# Patient Record
Sex: Male | Born: 1970
Health system: Southern US, Community
[De-identification: ages and names within clinical notes are randomized; demographics above are authoritative.]

## PROBLEM LIST (undated history)

## (undated) DIAGNOSIS — R7303 Prediabetes: Secondary | ICD-10-CM

## (undated) DIAGNOSIS — T8859XA Other complications of anesthesia, initial encounter: Secondary | ICD-10-CM

## (undated) DIAGNOSIS — R42 Dizziness and giddiness: Secondary | ICD-10-CM

## (undated) DIAGNOSIS — Z8041 Family history of malignant neoplasm of ovary: Secondary | ICD-10-CM

## (undated) DIAGNOSIS — K649 Unspecified hemorrhoids: Secondary | ICD-10-CM

## (undated) DIAGNOSIS — K219 Gastro-esophageal reflux disease without esophagitis: Secondary | ICD-10-CM

## (undated) DIAGNOSIS — D751 Secondary polycythemia: Secondary | ICD-10-CM

## (undated) DIAGNOSIS — N189 Chronic kidney disease, unspecified: Secondary | ICD-10-CM

## (undated) DIAGNOSIS — Z8481 Family history of carrier of genetic disease: Secondary | ICD-10-CM

## (undated) DIAGNOSIS — G4730 Sleep apnea, unspecified: Secondary | ICD-10-CM

## (undated) DIAGNOSIS — I10 Essential (primary) hypertension: Secondary | ICD-10-CM

## (undated) DIAGNOSIS — Z803 Family history of malignant neoplasm of breast: Secondary | ICD-10-CM

## (undated) DIAGNOSIS — T4145XA Adverse effect of unspecified anesthetic, initial encounter: Secondary | ICD-10-CM

## (undated) DIAGNOSIS — E7800 Pure hypercholesterolemia, unspecified: Secondary | ICD-10-CM

## (undated) DIAGNOSIS — Z8042 Family history of malignant neoplasm of prostate: Secondary | ICD-10-CM

## (undated) DIAGNOSIS — J45909 Unspecified asthma, uncomplicated: Secondary | ICD-10-CM

## (undated) DIAGNOSIS — Z86718 Personal history of other venous thrombosis and embolism: Secondary | ICD-10-CM

## (undated) DIAGNOSIS — R519 Headache, unspecified: Secondary | ICD-10-CM

## (undated) DIAGNOSIS — R51 Headache: Secondary | ICD-10-CM

## (undated) HISTORY — PX: LASIK: SHX215

## (undated) HISTORY — DX: Headache: R51

## (undated) HISTORY — DX: Family history of carrier of genetic disease: Z84.81

## (undated) HISTORY — PX: VASOTOMY: SUR1432

## (undated) HISTORY — DX: Chronic kidney disease, unspecified: N18.9

## (undated) HISTORY — PX: KNEE SURGERY: SHX244

## (undated) HISTORY — DX: Unspecified asthma, uncomplicated: J45.909

## (undated) HISTORY — DX: Dizziness and giddiness: R42

## (undated) HISTORY — DX: Secondary polycythemia: D75.1

## (undated) HISTORY — DX: Unspecified hemorrhoids: K64.9

## (undated) HISTORY — DX: Family history of malignant neoplasm of breast: Z80.3

## (undated) HISTORY — DX: Essential (primary) hypertension: I10

## (undated) HISTORY — DX: Personal history of other venous thrombosis and embolism: Z86.718

## (undated) HISTORY — DX: Sleep apnea, unspecified: G47.30

## (undated) HISTORY — DX: Family history of malignant neoplasm of prostate: Z80.42

## (undated) HISTORY — PX: TONSILLECTOMY: SUR1361

## (undated) HISTORY — DX: Pure hypercholesterolemia, unspecified: E78.00

## (undated) HISTORY — DX: Family history of malignant neoplasm of ovary: Z80.41

## (undated) HISTORY — DX: Headache, unspecified: R51.9

---

## 2005-08-15 HISTORY — PX: OTHER SURGICAL HISTORY: SHX169

## 2007-10-11 ENCOUNTER — Emergency Department: Payer: Self-pay | Admitting: Emergency Medicine

## 2007-10-12 ENCOUNTER — Emergency Department: Payer: Self-pay | Admitting: Emergency Medicine

## 2007-10-14 ENCOUNTER — Emergency Department: Payer: Self-pay | Admitting: Emergency Medicine

## 2008-09-08 ENCOUNTER — Encounter: Admission: RE | Admit: 2008-09-08 | Discharge: 2008-09-08 | Payer: Self-pay | Admitting: Internal Medicine

## 2010-12-21 ENCOUNTER — Other Ambulatory Visit: Payer: Self-pay | Admitting: Family Medicine

## 2010-12-21 DIAGNOSIS — N6321 Unspecified lump in the left breast, upper outer quadrant: Secondary | ICD-10-CM

## 2010-12-27 ENCOUNTER — Other Ambulatory Visit: Payer: Self-pay

## 2011-01-04 ENCOUNTER — Ambulatory Visit
Admission: RE | Admit: 2011-01-04 | Discharge: 2011-01-04 | Disposition: A | Discharge status: Home / Self Care | Payer: BC Managed Care – PPO | Source: Ambulatory Visit | Attending: Family Medicine | Admitting: Family Medicine

## 2011-01-04 ENCOUNTER — Other Ambulatory Visit: Payer: Self-pay | Admitting: Family Medicine

## 2011-01-04 DIAGNOSIS — N6320 Unspecified lump in the left breast, unspecified quadrant: Secondary | ICD-10-CM

## 2011-01-04 DIAGNOSIS — N6321 Unspecified lump in the left breast, upper outer quadrant: Secondary | ICD-10-CM

## 2011-04-23 ENCOUNTER — Emergency Department (HOSPITAL_COMMUNITY)
Admission: EM | Admit: 2011-04-23 | Discharge: 2011-04-23 | Disposition: A | Discharge status: Home / Self Care | Payer: Worker's Compensation | Attending: Emergency Medicine | Admitting: Emergency Medicine

## 2011-04-23 DIAGNOSIS — IMO0002 Reserved for concepts with insufficient information to code with codable children: Secondary | ICD-10-CM | POA: Insufficient documentation

## 2011-04-23 DIAGNOSIS — Y990 Civilian activity done for income or pay: Secondary | ICD-10-CM | POA: Insufficient documentation

## 2011-08-26 ENCOUNTER — Emergency Department: Payer: Self-pay | Admitting: Unknown Physician Specialty

## 2014-07-10 ENCOUNTER — Emergency Department (HOSPITAL_COMMUNITY)
Admission: EM | Admit: 2014-07-10 | Discharge: 2014-07-10 | Disposition: A | Discharge status: Home / Self Care | Payer: 59 | Attending: Emergency Medicine | Admitting: Emergency Medicine

## 2014-07-10 ENCOUNTER — Emergency Department (HOSPITAL_COMMUNITY): Payer: 59

## 2014-07-10 ENCOUNTER — Encounter (HOSPITAL_COMMUNITY): Payer: Self-pay

## 2014-07-10 DIAGNOSIS — R42 Dizziness and giddiness: Secondary | ICD-10-CM | POA: Diagnosis not present

## 2014-07-10 DIAGNOSIS — E780 Pure hypercholesterolemia: Secondary | ICD-10-CM | POA: Diagnosis not present

## 2014-07-10 DIAGNOSIS — R51 Headache: Secondary | ICD-10-CM | POA: Diagnosis present

## 2014-07-10 DIAGNOSIS — Z79899 Other long term (current) drug therapy: Secondary | ICD-10-CM | POA: Insufficient documentation

## 2014-07-10 DIAGNOSIS — R519 Headache, unspecified: Secondary | ICD-10-CM

## 2014-07-10 HISTORY — DX: Pure hypercholesterolemia, unspecified: E78.00

## 2014-07-10 LAB — CBC WITH DIFFERENTIAL/PLATELET
Basophils Absolute: 0 10*3/uL (ref 0.0–0.1)
Basophils Relative: 1 % (ref 0–1)
Eosinophils Absolute: 0.1 10*3/uL (ref 0.0–0.7)
Eosinophils Relative: 2 % (ref 0–5)
HCT: 43.5 % (ref 39.0–52.0)
Hemoglobin: 14.9 g/dL (ref 13.0–17.0)
Lymphocytes Relative: 31 % (ref 12–46)
Lymphs Abs: 1.8 10*3/uL (ref 0.7–4.0)
MCH: 27.1 pg (ref 26.0–34.0)
MCHC: 34.3 g/dL (ref 30.0–36.0)
MCV: 79.1 fL (ref 78.0–100.0)
Monocytes Absolute: 0.5 10*3/uL (ref 0.1–1.0)
Monocytes Relative: 9 % (ref 3–12)
Neutro Abs: 3.4 10*3/uL (ref 1.7–7.7)
Neutrophils Relative %: 57 % (ref 43–77)
Platelets: 249 10*3/uL (ref 150–400)
RBC: 5.5 MIL/uL (ref 4.22–5.81)
RDW: 12.6 % (ref 11.5–15.5)
WBC: 5.9 10*3/uL (ref 4.0–10.5)

## 2014-07-10 LAB — COMPREHENSIVE METABOLIC PANEL
ALT: 47 U/L (ref 0–53)
AST: 35 U/L (ref 0–37)
Albumin: 3.9 g/dL (ref 3.5–5.2)
Alkaline Phosphatase: 39 U/L (ref 39–117)
Anion gap: 14 (ref 5–15)
BUN: 17 mg/dL (ref 6–23)
CO2: 23 mEq/L (ref 19–32)
Calcium: 9.6 mg/dL (ref 8.4–10.5)
Chloride: 102 mEq/L (ref 96–112)
Creatinine, Ser: 0.95 mg/dL (ref 0.50–1.35)
GFR calc Af Amer: >90 mL/min (ref >90)
GFR calc non Af Amer: >90 mL/min (ref >90)
Glucose, Bld: 117 mg/dL — ABNORMAL HIGH (ref 70–99)
Potassium: 4.3 mEq/L (ref 3.7–5.3)
Sodium: 139 mEq/L (ref 137–147)
Total Bilirubin: 0.3 mg/dL (ref 0.3–1.2)
Total Protein: 7.2 g/dL (ref 6.0–8.3)

## 2014-07-10 LAB — TROPONIN I: Troponin I: <0.3 ng/mL (ref <0.30)

## 2014-07-10 NOTE — ED Notes (Signed)
Pt from home with headache and dizziness since Sunday.  Pt sts he also had twitching to left two weeks ago.  Pt with family hx of brain cancer; concerned about symptoms.  Currently not having any headaches at the moment; some dizziness currently.  Denies vomiting or nausea.  Ambulatory on arrival.

## 2014-07-10 NOTE — Discharge Instructions (Signed)

## 2014-07-10 NOTE — ED Provider Notes (Signed)
CSN: [id number]     Arrival date & time 07/10/14  1408 History   First MD Initiated Contact with Patient 07/10/14 1411     Chief Complaint  Patient presents with  . Dizziness  . Headache     (Consider location/radiation/quality/duration/timing/severity/associated sxs/prior Treatment) Patient is a 43 y.o. male presenting with headaches.  Headache Pain location:  Generalized Quality:  Sharp Radiates to:  Does not radiate Onset quality:  Gradual Duration: less than 1 minute. Timing:  Intermittent Progression:  Unchanged Chronicity:  New Similar to prior headaches: no   Context comment:  Spontaneous, at rest, not exertional, with an extreme amount of life stress Relieved by:  Nothing Worsened by:  Nothing tried Associated symptoms: dizziness   Associated symptoms: no abdominal pain, no blurred vision, no cough, no fever, no numbness, no syncope, no visual change and no vomiting     Past Medical History  Diagnosis Date  . Hypercholesteremia    Past Surgical History  Procedure Laterality Date  . Knee surgery    . Tonsillectomy     Family History  Problem Relation Age of Onset  . Cancer Mother    History  Substance Use Topics  . Smoking status: Never Smoker   . Smokeless tobacco: Not on file  . Alcohol Use: No    Review of Systems  Constitutional: Negative for fever.  Eyes: Negative for blurred vision.  Respiratory: Negative for cough.   Cardiovascular: Negative for syncope.  Gastrointestinal: Negative for vomiting and abdominal pain.  Neurological: Positive for dizziness and headaches. Negative for numbness.  All other systems reviewed and are negative.     Allergies  Review of patient's allergies indicates no known allergies.  Home Medications   Prior to Admission medications   Medication Sig Start Date End Date Taking? Authorizing Provider  esomeprazole (NEXIUM) 40 MG capsule Take 40 mg by mouth daily at 12 noon.   Yes Historical Provider, MD   ibuprofen (ADVIL,MOTRIN) 200 MG tablet Take 600 mg by mouth every 6 (six) hours as needed for headache or mild pain.   Yes Historical Provider, MD  montelukast (SINGULAIR) 10 MG tablet Take 10 mg by mouth daily. 06/12/14  Yes Historical Provider, MD  Multiple Vitamins-Minerals (MULTIVITAMIN WITH MINERALS) tablet Take 1 tablet by mouth daily.   Yes Historical Provider, MD  omega-3 acid ethyl esters (LOVAZA) 1 G capsule Take 1 g by mouth daily.   Yes Historical Provider, MD  OVER THE COUNTER MEDICATION Take 1 tablet by mouth daily. Focus factor   Yes Historical Provider, MD  PRESCRIPTION MEDICATION every 7 (seven) days. Allergy shot weekly   Yes Historical Provider, MD  simvastatin (ZOCOR) 40 MG tablet Take 40 mg by mouth daily. 06/28/14  Yes Historical Provider, MD   BP 139/76 mmHg  Pulse 67  Temp(Src) 98.4 F (36.9 C) (Oral)  Resp 18  Ht 5\' 11"  (1.803 m)  Wt 270 lb (122.471 kg)  BMI 37.67 kg/m2  SpO2 98% Physical Exam  Constitutional: He is oriented to person, place, and time. He appears well-developed and well-nourished.  HENT:  Head: Normocephalic and atraumatic.  Eyes: Conjunctivae and EOM are normal.  Neck: Normal range of motion. Neck supple.  Cardiovascular: Normal rate, regular rhythm and normal heart sounds.   Pulmonary/Chest: Effort normal and breath sounds normal. No respiratory distress.  Abdominal: He exhibits no distension. There is no tenderness. There is no rebound and no guarding.  Musculoskeletal: Normal range of motion.  Neurological: He is alert and oriented  to person, place, and time. He has normal strength and normal reflexes. No cranial nerve deficit or sensory deficit. Gait normal. GCS eye subscore is 4. GCS verbal subscore is 5. GCS motor subscore is 6.  Skin: Skin is warm and dry.  Vitals reviewed.   ED Course  Procedures (including critical care time) Labs Review Labs Reviewed  COMPREHENSIVE METABOLIC PANEL - Abnormal; Notable for the following:     Glucose, Bld 117 (*)    All other components within normal limits  CBC WITH DIFFERENTIAL  TROPONIN I    Imaging Review Ct Head Wo Contrast  07/10/2014   CLINICAL DATA:  Four day history of headache and dizziness  EXAM: CT HEAD WITHOUT CONTRAST  TECHNIQUE: Contiguous axial images were obtained from the base of the skull through the vertex without intravenous contrast.  COMPARISON:  None.  FINDINGS: The ventricles are normal in size and configuration. There is no mass, hemorrhage, extra-axial fluid collection, or midline shift. Gray-white compartments are normal. No acute infarct apparent. Bony calvarium appears intact. The mastoid air cells are clear. There is a small retention cyst in the inferior right maxillary antrum posteriorly. There is opacification of several ethmoid air cells bilaterally, more on the left than on the right. Frontal sinuses are aplastic.  IMPRESSION: Areas of paranasal sinus disease, primarily in the posterior left ethmoid air cell complex. Study otherwise unremarkable. No intracranial mass, hemorrhage, or focal gray -white compartment lesions/acute appearing infarct.   Electronically Signed   By: Bretta BangWilliam  Woodruff M.D.   On: 07/10/2014 16:17     EKG Interpretation   Date/Time:  Thursday July 10 2014 14:17:34 EST Ventricular Rate:  72 PR Interval:  145 QRS Duration: 93 QT Interval:  397 QTC Calculation: 434 R Axis:   -58 Text Interpretation:  Sinus rhythm Left anterior fascicular block Abnormal  R-wave progression, late transition Borderline T wave abnormalities  Baseline wander in lead(s) V6 No old tracing to compare Confirmed by  Mirian MoGentry, Morell Mears [phone]([id number]) on 07/10/2014 3:36:43 PM      MDM   Final diagnoses:  Headache    43 y.o. male with pertinent PMH of HLD presents with intermittent dizziness and headache over the last 2 weeks. The patient has been under an extreme amount of stress at home emotionally and states that his symptoms might be due to  stress.  He states that the episodes last approximately less than 1 minute and are characterized by sharp headache with dizziness, no syncopal symptoms. These tend to occur during emotional upset. On arrival today the patient has no symptoms. Vitals and physical exam as above, benign. Labs, CT unremarkable. We discussed that underlying malignancy is a possibility but is unlikely.  I referred him to a neurologist.  DC home in stable condition with strict return precautions.    1. Headache         Mirian MoMatthew Ladeana Laplant, MD 07/11/14 [phone]

## 2014-07-21 ENCOUNTER — Ambulatory Visit (INDEPENDENT_AMBULATORY_CARE_PROVIDER_SITE_OTHER): Payer: 59 | Admitting: Neurology

## 2014-07-21 ENCOUNTER — Encounter: Payer: Self-pay | Admitting: Neurology

## 2014-07-21 VITALS — BP 139/85 | HR 81 | Ht 71.0 in | Wt 274.0 lb

## 2014-07-21 DIAGNOSIS — E7800 Pure hypercholesterolemia, unspecified: Secondary | ICD-10-CM | POA: Insufficient documentation

## 2014-07-21 DIAGNOSIS — R42 Dizziness and giddiness: Secondary | ICD-10-CM | POA: Insufficient documentation

## 2014-07-21 NOTE — Progress Notes (Signed)
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 Littie DeedsGentry for evaluation of headaches, and dizziness, his primary care physician is Dr. Clarene DukeLittle  He presented to emergency room in July 10 2014, for blurry vision, headaches, dizziness,  CAT scan of the brain was normal,  Laboratory showed normal CBC, CMP, negative troponin  Since mid November 2015, he has noticed frequent headaches, blurry vision, dizziness, transient, lasting for few minutes, but can have few episodes in a day  His dizziness described as lightheadedness, difficulty focusing, feeling foggy, no associated chest pain, no heart palpitation, usually triggered by getting up quickly from seated position,  He had a history of obstructive sleep apnea, but could not tolerate CPAP machine, had a UPPP with some help   REVIEW OF SYSTEMS: Full 14 system review of systems performed and notable only for  poor vision, headaches, snoring, dizziness  ALLERGIES: No Known Allergies  HOME MEDICATIONS: Current Outpatient Prescriptions on File Prior to Visit  Medication Sig Dispense Refill  . esomeprazole (NEXIUM) 40 MG capsule Take 40 mg by mouth daily at 12 noon.    Marland Kitchen. ibuprofen (ADVIL,MOTRIN) 200 MG tablet Take 600 mg by mouth every 6 (six) hours as needed for headache or mild pain.    . montelukast (SINGULAIR) 10 MG tablet Take 10 mg by mouth daily.  11  . Multiple Vitamins-Minerals (MULTIVITAMIN WITH MINERALS) tablet Take 1 tablet by mouth daily.    Marland Kitchen. omega-3 acid ethyl esters (LOVAZA) 1 G capsule Take 1 g by mouth daily.    Marland Kitchen. OVER THE COUNTER MEDICATION Take 1 tablet by mouth daily. Focus factor    . PRESCRIPTION MEDICATION every 7 (seven) days. Allergy shot weekly    . simvastatin (ZOCOR) 40 MG tablet Take 40 mg by mouth daily.     No current facility-administered medications on file prior to visit.    PAST MEDICAL HISTORY: Past  Medical History  Diagnosis Date  . Hypercholesteremia   . High cholesterol   . Dizziness   . HA (headache)     PAST SURGICAL HISTORY: Past Surgical History  Procedure Laterality Date  . Knee surgery Right   . Tonsillectomy    . Vasotomy    . Bisep Right     FAMILY HISTORY: Family History  Problem Relation Age of Onset  . Cancer Mother   . Brain cancer Mother   . High Cholesterol Mother   . Prostate cancer Paternal Grandfather   . Cancer Maternal Grandmother   . Cancer Maternal Grandfather   . Diabetes Father   . High blood pressure Father   . High Cholesterol Father     SOCIAL HISTORY:  History   Social History  . Marital Status: Married    Spouse Name: Kathie RhodesBetty    Number of Children: 1  . Years of Education: 13   Occupational History  .      [employer]   Social History Main Topics  . Smoking status: Never Smoker   . Smokeless tobacco: Never Used  . Alcohol Use: 1.2 oz/week    2 Shots of liquor per week     Comment: OCC. Kooler  . Drug Use: No  . Sexual Activity: Not on file   Other Topics Concern  . Not on file   Social History Narrative   Patient lives at home  with his wife [name](Betty).  Patient works full time [place] [employer]  [employer].   Education some college.   Right handed.   Caffeine one shot of colombian  coffee.     PHYSICAL EXAM   Filed Vitals:   07/21/14 1211  BP: 139/85  Pulse: 81  Height: 5\' 11"  (1.803 m)  Weight: 274 lb (124.286 kg)    Not recorded      Body mass index is 38.23 kg/(m^2).   Generalized: In no acute distress  Neck: Supple, no carotid bruits   Cardiac: Regular rate rhythm  Pulmonary: Clear to auscultation bilaterally  Musculoskeletal: No deformity  Neurological examination  Mentation: Alert oriented to time, place, history taking, and causual conversation  Cranial nerve II-XII: Pupils were equal round reactive to light. Extraocular movements were full.  Visual field were full on  confrontational test. Bilateral fundi were sharp.  Facial sensation and strength were normal. Hearing was intact to finger rubbing bilaterally. Uvula tongue midline.  Head turning and shoulder shrug and were normal and symmetric.Tongue protrusion into cheek strength was normal. Narrow oropharyngeal   Motor: Normal tone, bulk and strength.  Sensory: Intact to fine touch, pinprick, preserved vibratory sensation, and proprioception at toes.  Coordination: Normal finger to nose, heel-to-shin bilaterally there was no truncal ataxia  Gait: Rising up from seated position without assistance, normal stance, without trunk ataxia, moderate stride, good arm swing, smooth turning, able to perform tiptoe, and heel walking without difficulty.   Romberg signs: Negative  Deep tendon reflexes: Brachioradialis 2/2, biceps 2/2, triceps 2/2, patellar 2/2, Achilles 2/2, plantar responses were flexor bilaterally.   DIAGNOSTIC DATA (LABS, IMAGING, TESTING) - I reviewed patient records, labs, notes, testing and imaging myself where available.  Lab Results  Component Value Date   WBC 5.9 07/10/2014   HGB 14.9 07/10/2014   HCT 43.5 07/10/2014   MCV 79.1 07/10/2014   PLT 249 07/10/2014      Component Value Date/Time   NA 139 07/10/2014 1444   K 4.3 07/10/2014 1444   CL 102 07/10/2014 1444   CO2 23 07/10/2014 1444   GLUCOSE 117* 07/10/2014 1444   BUN 17 07/10/2014 1444   CREATININE 0.95 07/10/2014 1444   CALCIUM 9.6 07/10/2014 1444   PROT 7.2 07/10/2014 1444   ALBUMIN 3.9 07/10/2014 1444   AST 35 07/10/2014 1444   ALT 47 07/10/2014 1444   ALKPHOS 39 07/10/2014 1444   BILITOT 0.3 07/10/2014 1444   GFRNONAA >90 07/10/2014 1444   GFRAA >90 07/10/2014 1444    ASSESSMENT AND PLAN  Christopher Burns is a 43 y.o. male complains of  Headaches, blurry vision, dizziness, lightheadedness since mid November 2015, oral narrow pharyngeal, history of obstructive sleep apnea, could not tolerate CPAP machine the past  CAT scan of the brain was normal  His complains a nonspecific, differentiation diagnosis including orthostatic blood pressure change, dehydration, fatigue induced by obstructive sleep apnea  TSH, B12, I will call him laboratory result, His symptoms overall has much improved, will return to clinic for new issues    Levert FeinsteinYijun Chris Narasimhan, M.D. Ph.D.  [facility] [address], [address] [place], [place] [address] [phone]

## 2014-07-22 LAB — VITAMIN B12: Vitamin B-12: 574 pg/mL (ref 211–946)

## 2014-07-22 LAB — TSH: TSH: 2.72 u[IU]/mL (ref 0.450–4.500)

## 2014-07-23 NOTE — Progress Notes (Signed)
Quick Note:  Called and spoke to patient relayed normal labs. ______ 

## 2015-02-12 ENCOUNTER — Encounter: Payer: Self-pay | Admitting: Podiatry

## 2015-02-24 ENCOUNTER — Ambulatory Visit (INDEPENDENT_AMBULATORY_CARE_PROVIDER_SITE_OTHER): Payer: 59 | Admitting: Podiatry

## 2015-02-24 ENCOUNTER — Encounter: Payer: Self-pay | Admitting: Podiatry

## 2015-02-24 VITALS — BP 128/75 | HR 86 | Resp 15 | Ht 70.0 in | Wt 265.0 lb

## 2015-02-24 DIAGNOSIS — M722 Plantar fascial fibromatosis: Secondary | ICD-10-CM | POA: Diagnosis not present

## 2015-02-24 MED ORDER — TRIAMCINOLONE ACETONIDE 10 MG/ML IJ SUSP
10.0000 mg | Freq: Once | INTRAMUSCULAR | Status: AC
  Administered 2015-02-24: 10 mg

## 2015-02-24 MED ORDER — DICLOFENAC SODIUM 75 MG PO TBEC: 75.0000 mg | DELAYED_RELEASE_TABLET | Freq: Two times a day (BID) | ORAL | Status: DC

## 2015-02-24 NOTE — Progress Notes (Signed)
   Subjective:    Patient ID: Christopher Burns, male    DOB: 29-May-1971, 44 y.o.   MRN: [id number]  HPI Patient presents with heel pain in right foot.This has been going on for the past 5-6 months. Patient has iced foot, used an ice water bath, stretching foot, sleeping with boot with some relief.   Review of Systems  Constitutional: Positive for unexpected weight change.  Musculoskeletal: Positive for back pain and arthralgias.  Skin: Positive for color change.  All other systems reviewed and are negative.      Objective:   Physical Exam        Assessment & Plan:

## 2015-02-24 NOTE — Patient Instructions (Signed)

## 2015-02-25 NOTE — Progress Notes (Signed)
Subjective:     Patient ID: Christopher Burns, male   DOB: February 14, 1971, 44 y.o.   MRN: [id number]  HPI patient presents stating I been having a lot of pain in my right heel for the last 6 months. I reviewed ice and anti-inflammatory's and a sleeping boot with some relief of my symptoms   Review of Systems  All other systems reviewed and are negative.      Objective:   Physical Exam  Constitutional: He is oriented to person, place, and time.  Cardiovascular: Intact distal pulses.   Musculoskeletal: Normal range of motion.  Neurological: He is oriented to person, place, and time.  Skin: Skin is warm.  Nursing note and vitals reviewed.  neurovascular status intact with muscle strength adequate range of motion within normal limits and patient noted to have quite a bit of discomfort in the plantar heel right at the insertional point of the tendon into the calcaneus. I noted there to be diminishment of arch height and patient does have a wide foot type and is moderately obese. Digital perfusion within normal limits and patient's noted to be well oriented 3     Assessment:     Acute plantar fasciitis right with moderate depression of the arch as a complicating factor and obesity as secondary factor    Plan:     H&P and x-rays reviewed with patient. Injected the plantar fascia 3 mg Kenalog 5 mg Xylocaine and instructed on physical therapy anti-inflammatory's and dispensed fascial brace with instructions on usage. Reappoint 3 weeks and discussed possible long-term orthotic therapy

## 2015-03-23 ENCOUNTER — Encounter: Payer: Self-pay | Admitting: Podiatry

## 2015-03-23 ENCOUNTER — Ambulatory Visit (INDEPENDENT_AMBULATORY_CARE_PROVIDER_SITE_OTHER): Payer: 59 | Admitting: Podiatry

## 2015-03-23 VITALS — BP 144/92 | HR 85 | Resp 16

## 2015-03-23 DIAGNOSIS — M722 Plantar fascial fibromatosis: Secondary | ICD-10-CM | POA: Diagnosis not present

## 2015-03-23 MED ORDER — TRIAMCINOLONE ACETONIDE 10 MG/ML IJ SUSP
10.0000 mg | Freq: Once | INTRAMUSCULAR | Status: AC
  Administered 2015-03-23: 10 mg

## 2015-03-24 NOTE — Progress Notes (Signed)
Subjective:     Patient ID: Christopher Burns, male   DOB: 29-Mar-1971, 44 y.o.   MRN: [id number]  HPI patient states my right foot is improving but is still painful when I do a lot of walking and I do have flatfeet   Review of Systems     Objective:   Physical Exam Neurovascular status intact muscle strength adequate with continued discomfort plantar aspect heel right at the insertion tendon calcaneus    Assessment:     Plantar fasciitis improved but present right    Plan:       reinjected the plantar fascia 3 mg Kenalog 5 mg Xylocaine and due to foot structure went ahead and scanned for custom orthotic devices

## 2015-04-08 ENCOUNTER — Encounter: Payer: Self-pay | Admitting: *Deleted

## 2015-04-15 NOTE — Progress Notes (Signed)
This encounter was created in error - please disregard.

## 2015-04-16 ENCOUNTER — Ambulatory Visit: Payer: 59 | Admitting: General Surgery

## 2015-04-21 ENCOUNTER — Ambulatory Visit: Payer: 59 | Admitting: *Deleted

## 2015-04-21 DIAGNOSIS — M722 Plantar fascial fibromatosis: Secondary | ICD-10-CM

## 2015-04-21 NOTE — Patient Instructions (Signed)

## 2015-04-21 NOTE — Progress Notes (Signed)
Patient ID: Christopher Burns, male   DOB: Oct 31, 1970, 44 y.o.   MRN: [id number] Patient presents for orthotic pick up.  Verbal and written break in and wear instructions given.  Patient will follow up in 4 weeks if symptoms worsen or fail to improve.

## 2015-05-14 ENCOUNTER — Ambulatory Visit: Payer: 59 | Admitting: General Surgery

## 2015-05-26 ENCOUNTER — Ambulatory Visit: Payer: 59 | Admitting: General Surgery

## 2015-06-09 ENCOUNTER — Ambulatory Visit (INDEPENDENT_AMBULATORY_CARE_PROVIDER_SITE_OTHER): Payer: 59 | Admitting: General Surgery

## 2015-06-09 ENCOUNTER — Encounter: Payer: Self-pay | Admitting: General Surgery

## 2015-06-09 DIAGNOSIS — K420 Umbilical hernia with obstruction, without gangrene: Secondary | ICD-10-CM

## 2015-06-09 NOTE — Patient Instructions (Addendum)
Hernia, Adult A hernia is the bulging of an organ or tissue through a weak spot in the muscles of the abdomen (abdominal wall). Hernias develop most often near the navel or groin. There are many kinds of hernias. Common kinds include:  Femoral hernia. This kind of hernia develops under the groin in the upper thigh area.  Inguinal hernia. This kind of hernia develops in the groin or scrotum.  Umbilical hernia. This kind of hernia develops near the navel.  Hiatal hernia. This kind of hernia causes part of the stomach to be pushed up into the chest.  Incisional hernia. This kind of hernia bulges through a scar from an abdominal surgery. CAUSES This condition may be caused by:  Heavy lifting.  Coughing over a long period of time.  Straining to have a bowel movement.  An incision made during an abdominal surgery.  A birth defect (congenital defect).  Excess weight or obesity.  Smoking.  Poor nutrition.  Cystic fibrosis.  Excess fluid in the abdomen.  Undescended testicles. SYMPTOMS Symptoms of a hernia include:  A lump on the abdomen. This is the first sign of a hernia. The lump may become more obvious with standing, straining, or coughing. It may get bigger over time if it is not treated or if the condition causing it is not treated.  Pain. A hernia is usually painless, but it may become painful over time if treatment is delayed. The pain is usually dull and may get worse with standing or lifting heavy objects. Sometimes a hernia gets tightly squeezed in the weak spot (strangulated) or stuck there (incarcerated) and causes additional symptoms. These symptoms may include:  Vomiting.  Nausea.  Constipation.  Irritability. DIAGNOSIS A hernia may be diagnosed with:  A physical exam. During the exam your health care provider may ask you to cough or to make a specific movement, because a hernia is usually more visible when you move.  Imaging tests. These can  include:  X-rays.  Ultrasound.  CT scan. TREATMENT A hernia that is small and painless may not need to be treated. A hernia that is large or painful may be treated with surgery. Inguinal hernias may be treated with surgery to prevent incarceration or strangulation. Strangulated hernias are always treated with surgery, because lack of blood to the trapped organ or tissue can cause it to die. Surgery to treat a hernia involves pushing the bulge back into place and repairing the weak part of the abdomen. HOME CARE INSTRUCTIONS  Avoid straining.  Do not lift anything heavier than 10 lb (4.5 kg).  Lift with your leg muscles, not your back muscles. This helps avoid strain.  When coughing, try to cough gently.  Prevent constipation. Constipation leads to straining with bowel movements, which can make a hernia worse or cause a hernia repair to break down. You can prevent constipation by:  Eating a high-fiber diet that includes plenty of fruits and vegetables.  Drinking enough fluids to keep your urine clear or pale yellow. Aim to drink 6-8 glasses of water per day.  Using a stool softener as directed by your health care provider.  Lose weight, if you are overweight.  Do not use any tobacco products, including cigarettes, chewing tobacco, or electronic cigarettes. If you need help quitting, ask your health care provider.  Keep all follow-up visits as directed by your health care provider. This is important. Your health care provider may need to monitor your condition. SEEK MEDICAL CARE IF:  You have  swelling, redness, and pain in the affected area.  Your bowel habits change. SEEK IMMEDIATE MEDICAL CARE IF:  You have a fever.  You have abdominal pain that is getting worse.  You feel nauseous or you vomit.  You cannot push the hernia back in place by gently pressing on it while you are lying down.  The hernia:  Changes in shape or size.  Is stuck outside the  abdomen.  Becomes discolored.  Feels hard or tender.   This information is not intended to replace advice given to you by your health care provider. Make sure you discuss any questions you have with your health care provider.   Document Released: 08/01/2005 Document Revised: 08/22/2014 Document Reviewed: 06/11/2014 Elsevier Interactive Patient Education [employer]2016 [employer]  [facility]. He will pre admit by phone. He will be seen here in the office for a pre op visit on 07/16/15 at 1:30 pm with Dr Evette CristalSankar. Patient is aware of dates, time, and instructions.

## 2015-06-09 NOTE — Progress Notes (Signed)
Patient ID: Christopher Burns, male   DOB: 1970-11-05, 44 y.o.   MRN: [id number]  Chief Complaint  Patient presents with  . Hernia    HPI Christopher Burns is a 44 y.o. male.  Here today for evaluation of a possible hernia. He states he has noticed an umbilical hernia for about 5 years. He states it has gotten larger over the past several years. Denies pain. Bowels move regular. He is in [employer] in [place]. He does lift weights at the gym.   HPI  Past Medical History  Diagnosis Date  . Hypercholesteremia   . High cholesterol   . Dizziness   . HA (headache)   . Hemorrhoid   . Asthma   . Sleep apnea     Past Surgical History  Procedure Laterality Date  . Knee surgery Right   . Tonsillectomy    . Vasotomy    . Bisep Right     Family History  Problem Relation Age of Onset  . Cancer Mother     breast  . Brain cancer Mother   . High Cholesterol Mother   . Prostate cancer Paternal Grandfather   . Cancer Maternal Grandmother   . Cancer Maternal Grandfather   . Diabetes Father   . High blood pressure Father   . High Cholesterol Father     Social History Social History  Substance Use Topics  . Smoking status: Never Smoker   . Smokeless tobacco: Never Used  . Alcohol Use: 1.2 oz/week    2 Shots of liquor per week     Comment: OCC. Kooler    No Known Allergies  Current Outpatient Prescriptions  Medication Sig Dispense Refill  . Cholecalciferol (VITAMIN D) 2000 UNITS tablet Take 2,000 Units by mouth daily.    . diclofenac (VOLTAREN) 75 MG EC tablet Take 1 tablet (75 mg total) by mouth 2 (two) times daily. 50 tablet 2  . EPIPEN 2-PAK 0.3 MG/0.3ML SOAJ injection See admin instructions.  1  . esomeprazole (NEXIUM) 40 MG capsule Take 40 mg by mouth daily at 12 noon.    . Fluticasone Furoate-Vilanterol (BREO ELLIPTA) 200-25 MCG/INH AEPB Inhale into the lungs daily.    Marland Kitchen ibuprofen (ADVIL,MOTRIN) 200 MG tablet Take 600 mg by mouth every 6 (six) hours as needed for  headache or mild pain.    . L-ARGININE PO Take by mouth daily.    . montelukast (SINGULAIR) 10 MG tablet Take 10 mg by mouth daily.  11  . Multiple Vitamins-Minerals (MULTIVITAMIN WITH MINERALS) tablet Take 1 tablet by mouth daily.    Marland Kitchen omega-3 acid ethyl esters (LOVAZA) 1 G capsule Take 1 g by mouth daily.    . simvastatin (ZOCOR) 40 MG tablet Take 40 mg by mouth daily.    . VENTOLIN HFA 108 (90 BASE) MCG/ACT inhaler USE 2 PUFFS 4 TIMES A DAY AS NEEDED  0   No current facility-administered medications for this visit.    Review of Systems Review of Systems  Constitutional: Negative.   Respiratory: Negative.   Cardiovascular: Negative.     Blood pressure 124/78, pulse 82, resp. rate 14, height  (1.803 m), weight 270 lb (122.471 kg).  Physical Exam Physical Exam  Constitutional: He is oriented to person, place, and time. He appears well-developed and well-nourished.  HENT:  Mouth/Throat: Oropharynx is clear and moist.  Eyes: Conjunctivae are normal. No scleral icterus.  Neck: Neck supple.  Cardiovascular: Normal rate, regular rhythm and normal heart sounds.   Pulmonary/Chest:  Effort normal and breath sounds normal.  Abdominal: Soft. Normal appearance and bowel sounds are normal. There is no tenderness. A hernia is present. Hernia confirmed negative in the right inguinal area and confirmed negative in the left inguinal area.  4 cm non reducible umbilical hernia.  Lymphadenopathy:    He has no cervical adenopathy.  Neurological: He is alert and oriented to person, place, and time.  Skin: Skin is warm and dry.  Psychiatric: His behavior is normal.    Data Reviewed Progress notes.  Assessment    Umbilical hernia-irreducible. Likely has omenbtal incarceration.     Plan    Hernia precautions and incarceration were discussed with the patient. If they develop symptoms of an incarcerated hernia, they were encouraged to seek prompt medical attention.  I have recommended  repair of the hernia using the possible use of mesh on an outpatient basis in the near future. The risk of infection was reviewed. The role of prosthetic mesh to minimize the risk of recurrence was reviewed.   [facility]. He will pre admit by phone. He will be seen here in the office for a pre op visit on 07/16/15 at 1:30 pm with Dr Evette CristalSankar. Patient is aware of dates, time, and instructions.      PCP:  Ave FilterMasoud, Javed  Kerstin Crusoe G 06/09/2015, 1:40 PM

## 2015-06-12 ENCOUNTER — Telehealth: Payer: Self-pay

## 2015-06-12 NOTE — Telephone Encounter (Signed)
Patient called and would like to reschedule his surgery for 07/23/15. Patient is now scheduled for [facility]. He will pre admit by phone and be seen here for a pre op visit on 07/16/15 at 1:30 pm.

## 2015-07-06 ENCOUNTER — Other Ambulatory Visit: Payer: Self-pay | Admitting: General Surgery

## 2015-07-06 DIAGNOSIS — K420 Umbilical hernia with obstruction, without gangrene: Secondary | ICD-10-CM

## 2015-07-14 ENCOUNTER — Inpatient Hospital Stay
Admission: RE | Admit: 2015-07-14 | Discharge: 2015-07-14 | Disposition: A | Discharge status: Home / Self Care | Payer: Self-pay | Source: Ambulatory Visit

## 2015-07-14 HISTORY — DX: Other complications of anesthesia, initial encounter: T88.59XA

## 2015-07-14 HISTORY — DX: Gastro-esophageal reflux disease without esophagitis: K21.9

## 2015-07-14 HISTORY — DX: Adverse effect of unspecified anesthetic, initial encounter: T41.45XA

## 2015-07-14 NOTE — Patient Instructions (Signed)
  Your procedure is scheduled on: 07-29-15 Report to [facility] 2ND FLOOR To find out your arrival time please call [phone] between 1PM - 3PM on 07-28-15  Remember: Instructions that are not followed completely may result in serious medical risk, up to and including death, or upon the discretion of your surgeon and anesthesiologist your surgery may need to be rescheduled.    _X___ 1. Do not eat food or drink liquids after midnight. No gum chewing or hard candies.     _X___ 2. No Alcohol for 24 hours before or after surgery.   ____ 3. Bring all medications with you on the day of surgery if instructed.    _X___ 4. Notify your doctor if there is any change in your medical condition     (cold, fever, infections).     Do not wear jewelry, make-up, hairpins, clips or nail polish.  Do not wear lotions, powders, or perfumes. You may wear deodorant.  Do not shave 48 hours prior to surgery. Men may shave face and neck.  Do not bring valuables to the hospital.    [facility] not responsible for any belongings or valuables.               Contacts, dentures or bridgework may not be worn into surgery.  Leave your suitcase in the car. After surgery it may be brought to your room.  For patients admitted to the hospital, discharge time is determined by your treatment team.   Patients discharged the day of surgery will not be allowed to drive home.   Please read over the following fact sheets that you were given:      __X__ Take these medicines the morning of surgery with A SIP OF WATER:    1. NEXIUM  2.   3.   4.  5.  6.  ____ Fleet Enema (as directed)   ____ Use CHG Soap as directed  _X___ Use inhalers on the day of surgery-USE BREO ELLIPTA AND VENTOLIN AND BRING VENTOLIN TO HOSPITAL   ____ Stop metformin 2 days prior to surgery    ____ Take 1/2 of usual insulin dose the night before surgery and none on the morning of surgery.   ____ Stop  Coumadin/Plavix/aspirin-N/A  ____ Stop Anti-inflammatories-NO NSAIDS OR ASA PRODUCTS-TYLENOL OK   _X___ Stop supplements until after surgery-STOP OMEGA 3 7 DAYS PRIOR TO SURGERY   ____ Bring C-Pap to the hospital.

## 2015-07-16 ENCOUNTER — Ambulatory Visit (INDEPENDENT_AMBULATORY_CARE_PROVIDER_SITE_OTHER): Payer: 59 | Admitting: General Surgery

## 2015-07-16 ENCOUNTER — Encounter: Payer: Self-pay | Admitting: General Surgery

## 2015-07-16 VITALS — BP 124/72 | HR 76 | Resp 12 | Ht 71.0 in | Wt 273.0 lb

## 2015-07-16 DIAGNOSIS — K420 Umbilical hernia with obstruction, without gangrene: Secondary | ICD-10-CM

## 2015-07-16 NOTE — Progress Notes (Signed)
Patient ID: Christopher Burns, male   DOB: 1971/03/05, 44 y.o.   MRN: [id number]  Chief Complaint  Patient presents with  . Pre-op Exam    HPI Christopher NetSamuel Gillin is a 44 y.o. male here today for his pre op umbilical hernia repair scheduled on 07/29/15. No new complaints. [place] recently traveled to [place] and noted some mild respiratory symptoms Better now. I have reviewed the history of present illness with the patient.  HPI  Past Medical History  Diagnosis Date  . Hypercholesteremia   . High cholesterol   . Dizziness   . HA (headache)   . Hemorrhoid   . Asthma   . Sleep apnea     NO CPAP  . GERD (gastroesophageal reflux disease)   . Complication of anesthesia     HICCUPS FOR 1 WEEK AFTER BICEP SURGERY    Past Surgical History  Procedure Laterality Date  . Knee surgery Right   . Tonsillectomy    . Vasotomy    . Bisep Left 2007  . Lasik      Family History  Problem Relation Age of Onset  . Cancer Mother     breast  . Brain cancer Mother   . High Cholesterol Mother   . Prostate cancer Paternal Grandfather   . Cancer Maternal Grandmother   . Cancer Maternal Grandfather   . Diabetes Father   . High blood pressure Father   . High Cholesterol Father     Social History Social History  Substance Use Topics  . Smoking status: Never Smoker   . Smokeless tobacco: Never Used  . Alcohol Use: 1.2 oz/week    2 Shots of liquor per week     Comment: OCC. Kooler    No Known Allergies  Current Outpatient Prescriptions  Medication Sig Dispense Refill  . Cholecalciferol (VITAMIN D) 2000 UNITS tablet Take 2,000 Units by mouth daily.    . diclofenac (VOLTAREN) 75 MG EC tablet Take 1 tablet (75 mg total) by mouth 2 (two) times daily. 50 tablet 2  . EPIPEN 2-PAK 0.3 MG/0.3ML SOAJ injection See admin instructions.  1  . esomeprazole (NEXIUM) 40 MG capsule Take 40 mg by mouth at bedtime.     . Fluticasone Furoate-Vilanterol (BREO ELLIPTA) 200-25 MCG/INH AEPB Inhale 1 puff into the  lungs every morning.     Marland Kitchen. ibuprofen (ADVIL,MOTRIN) 200 MG tablet Take 600 mg by mouth every 6 (six) hours as needed for headache or mild pain.    . L-ARGININE PO Take 1 tablet by mouth daily.     . montelukast (SINGULAIR) 10 MG tablet Take 10 mg by mouth at bedtime.   11  . Multiple Vitamins-Minerals (MULTIVITAMIN WITH MINERALS) tablet Take 1 tablet by mouth daily.    Marland Kitchen. omega-3 acid ethyl esters (LOVAZA) 1 G capsule Take 1 g by mouth daily.    . simvastatin (ZOCOR) 40 MG tablet Take 40 mg by mouth at bedtime.     . VENTOLIN HFA 108 (90 BASE) MCG/ACT inhaler USE 2 PUFFS 4 TIMES A DAY AS NEEDED  0   No current facility-administered medications for this visit.    Review of Systems Review of Systems  Constitutional: Negative.   Respiratory: Negative.   Cardiovascular: Negative.     Blood pressure 124/72, pulse 76, resp. rate 12, height 5\' 11"  (1.803 m), weight 273 lb (123.832 kg).  Physical Exam Physical Exam  Constitutional: He is oriented to person, place, and time. He appears well-developed and well-nourished.  Eyes: Conjunctivae  are normal. No scleral icterus.  Neck: Neck supple.  Cardiovascular: Normal rate, regular rhythm and normal heart sounds.   Pulmonary/Chest: Effort normal and breath sounds normal.  Abdominal: Soft. Normal appearance and bowel sounds are normal. He exhibits no distension. There is no hepatomegaly. There is no tenderness. A hernia (3cm visible and palpable hernia at umbilicus) is present.  Lymphadenopathy:    He has no cervical adenopathy.  Neurological: He is alert and oriented to person, place, and time.  Skin: Skin is warm and dry.    Data Reviewed Progress notes reviewed  Assessment    Umbilical Hernia, scheduled for repair in 2 weeks.      Plan    Patient is scheduled for umbilical hernia surgery on 07/29/15.      Hernia precautions and incarceration were discussed with the patient. If they develop symptoms of an incarcerated hernia, they  were encouraged to seek prompt medical attention.  I have recommended repair of the hernia using mesh on an outpatient basis in the near future. The risk of infection was reviewed. The role of prosthetic mesh to minimize the risk of recurrence was reviewed.  Imre Vecchione G 07/16/2015, 1:53 PM

## 2015-07-16 NOTE — Patient Instructions (Signed)
Call with any questions or concerned. Follow up as scheduled for umbilical hernia repair.

## 2015-07-27 ENCOUNTER — Ambulatory Visit (INDEPENDENT_AMBULATORY_CARE_PROVIDER_SITE_OTHER): Payer: 59 | Admitting: Podiatry

## 2015-07-27 ENCOUNTER — Encounter: Payer: Self-pay | Admitting: Podiatry

## 2015-07-27 DIAGNOSIS — M722 Plantar fascial fibromatosis: Secondary | ICD-10-CM | POA: Diagnosis not present

## 2015-07-27 MED ORDER — TRIAMCINOLONE ACETONIDE 10 MG/ML IJ SUSP
10.0000 mg | Freq: Once | INTRAMUSCULAR | Status: AC
  Administered 2015-07-27: 10 mg

## 2015-07-28 NOTE — Progress Notes (Signed)
Subjective:     Patient ID: Christopher Burns, male   DOB: 08-11-1971, 44 y.o.   MRN: [id number]  HPI patient states my heel pain has returned and I was probably to active on it and it's become aggravated   Review of Systems     Objective:   Physical Exam Neurovascular status intact muscle strength adequate with discomfort plantar aspect right heel    Assessment:     Plantar fasciitis right improved but present    Plan:     Reinjected plantar fascia 3 mg Kenalog 5 mg Xylocaine and reappoint to recheck as needed and advised on physical therapy splint usage and stretching exercises

## 2015-07-29 ENCOUNTER — Encounter: Payer: Self-pay | Admitting: *Deleted

## 2015-07-29 ENCOUNTER — Ambulatory Visit: Payer: 59 | Admitting: Anesthesiology

## 2015-07-29 ENCOUNTER — Ambulatory Visit
Admission: RE | Admit: 2015-07-29 | Discharge: 2015-07-29 | Disposition: A | Discharge status: Home / Self Care | Payer: 59 | Source: Ambulatory Visit | Attending: General Surgery | Admitting: General Surgery

## 2015-07-29 ENCOUNTER — Encounter
Admission: RE | Disposition: A | Discharge status: Home / Self Care | Payer: Self-pay | Source: Ambulatory Visit | Attending: General Surgery

## 2015-07-29 DIAGNOSIS — Z791 Long term (current) use of non-steroidal anti-inflammatories (NSAID): Secondary | ICD-10-CM | POA: Diagnosis not present

## 2015-07-29 DIAGNOSIS — G4730 Sleep apnea, unspecified: Secondary | ICD-10-CM | POA: Insufficient documentation

## 2015-07-29 DIAGNOSIS — E7800 Pure hypercholesterolemia, unspecified: Secondary | ICD-10-CM | POA: Diagnosis not present

## 2015-07-29 DIAGNOSIS — J45909 Unspecified asthma, uncomplicated: Secondary | ICD-10-CM | POA: Insufficient documentation

## 2015-07-29 DIAGNOSIS — K420 Umbilical hernia with obstruction, without gangrene: Secondary | ICD-10-CM | POA: Diagnosis not present

## 2015-07-29 DIAGNOSIS — Z79899 Other long term (current) drug therapy: Secondary | ICD-10-CM | POA: Insufficient documentation

## 2015-07-29 DIAGNOSIS — K219 Gastro-esophageal reflux disease without esophagitis: Secondary | ICD-10-CM | POA: Insufficient documentation

## 2015-07-29 DIAGNOSIS — Z7951 Long term (current) use of inhaled steroids: Secondary | ICD-10-CM | POA: Insufficient documentation

## 2015-07-29 DIAGNOSIS — Z8719 Personal history of other diseases of the digestive system: Secondary | ICD-10-CM | POA: Insufficient documentation

## 2015-07-29 HISTORY — PX: UMBILICAL HERNIA REPAIR: SHX196

## 2015-07-29 SURGERY — REPAIR, HERNIA, UMBILICAL, ADULT
Anesthesia: General | Wound class: Clean

## 2015-07-29 MED ORDER — DEXAMETHASONE SODIUM PHOSPHATE 10 MG/ML IJ SOLN
INTRAMUSCULAR | Status: DC | PRN
  Administered 2015-07-29: 5 mg via INTRAVENOUS

## 2015-07-29 MED ORDER — LIDOCAINE HCL (PF) 1 % IJ SOLN
INTRAMUSCULAR | Status: AC
  Filled 2015-07-29: qty 30

## 2015-07-29 MED ORDER — CEFAZOLIN SODIUM-DEXTROSE 2-3 GM-% IV SOLR
2.0000 g | INTRAVENOUS | Status: AC
  Administered 2015-07-29: 2 g via INTRAVENOUS

## 2015-07-29 MED ORDER — ONDANSETRON HCL 4 MG/2ML IJ SOLN
INTRAMUSCULAR | Status: DC | PRN
  Administered 2015-07-29: 4 mg via INTRAVENOUS

## 2015-07-29 MED ORDER — CHLORHEXIDINE GLUCONATE 4 % EX LIQD: 1.0000 "application " | Freq: Once | CUTANEOUS | Status: DC

## 2015-07-29 MED ORDER — CEFAZOLIN SODIUM-DEXTROSE 2-3 GM-% IV SOLR
INTRAVENOUS | Status: AC
  Filled 2015-07-29: qty 50

## 2015-07-29 MED ORDER — LACTATED RINGERS IV SOLN
INTRAVENOUS | Status: DC
  Administered 2015-07-29: 11:00:00 via INTRAVENOUS

## 2015-07-29 MED ORDER — OXYCODONE HCL 5 MG PO TABS: 5.0000 mg | ORAL_TABLET | Freq: Once | ORAL | Status: DC | PRN

## 2015-07-29 MED ORDER — LIDOCAINE HCL (CARDIAC) 20 MG/ML IV SOLN
INTRAVENOUS | Status: DC | PRN
  Administered 2015-07-29: 50 mg via INTRAVENOUS

## 2015-07-29 MED ORDER — BUPIVACAINE HCL (PF) 0.5 % IJ SOLN
INTRAMUSCULAR | Status: AC
  Filled 2015-07-29: qty 30

## 2015-07-29 MED ORDER — LIDOCAINE HCL 1 % IJ SOLN
INTRAMUSCULAR | Status: DC | PRN
  Administered 2015-07-29: 20 mL via SUBCUTANEOUS

## 2015-07-29 MED ORDER — FENTANYL CITRATE (PF) 100 MCG/2ML IJ SOLN: 25.0000 ug | INTRAMUSCULAR | Status: DC | PRN

## 2015-07-29 MED ORDER — ACETAMINOPHEN 10 MG/ML IV SOLN
INTRAVENOUS | Status: DC | PRN
  Administered 2015-07-29: 1000 mg via INTRAVENOUS

## 2015-07-29 MED ORDER — MIDAZOLAM HCL 2 MG/2ML IJ SOLN
INTRAMUSCULAR | Status: DC | PRN
  Administered 2015-07-29: 2 mg via INTRAVENOUS

## 2015-07-29 MED ORDER — PROPOFOL 10 MG/ML IV BOLUS
INTRAVENOUS | Status: DC | PRN
  Administered 2015-07-29: 200 mg via INTRAVENOUS

## 2015-07-29 MED ORDER — OXYCODONE-ACETAMINOPHEN 5-325 MG PO TABS: 1.0000 | ORAL_TABLET | ORAL | Status: DC | PRN

## 2015-07-29 MED ORDER — ACETAMINOPHEN 10 MG/ML IV SOLN
INTRAVENOUS | Status: AC
  Filled 2015-07-29: qty 100

## 2015-07-29 MED ORDER — OXYCODONE HCL 5 MG/5ML PO SOLN: 5.0000 mg | Freq: Once | ORAL | Status: DC | PRN

## 2015-07-29 MED ORDER — FENTANYL CITRATE (PF) 100 MCG/2ML IJ SOLN
INTRAMUSCULAR | Status: DC | PRN
  Administered 2015-07-29: 50 ug via INTRAVENOUS
  Administered 2015-07-29: 100 ug via INTRAVENOUS

## 2015-07-29 SURGICAL SUPPLY — 23 items
BLADE SURG 15 STRL SS SAFETY (BLADE) ×2 IMPLANT
CANISTER SUCT 1200ML W/VALVE (MISCELLANEOUS) ×2 IMPLANT
CHLORAPREP W/TINT 26ML (MISCELLANEOUS) ×2 IMPLANT
DECANTER SPIKE VIAL GLASS SM (MISCELLANEOUS) ×4 IMPLANT
DRAPE LAPAROTOMY 100X77 ABD (DRAPES) ×2 IMPLANT
GLOVE BIO SURGEON STRL SZ7 (GLOVE) ×4 IMPLANT
GOWN STRL REUS W/ TWL LRG LVL3 (GOWN DISPOSABLE) ×2 IMPLANT
GOWN STRL REUS W/TWL LRG LVL3 (GOWN DISPOSABLE) ×2
KIT RM TURNOVER STRD PROC AR (KITS) ×2 IMPLANT
LABEL OR SOLS (LABEL) ×2 IMPLANT
LIQUID BAND (GAUZE/BANDAGES/DRESSINGS) ×2 IMPLANT
NEEDLE HYPO 25X1 1.5 SAFETY (NEEDLE) ×2 IMPLANT
NS IRRIG 500ML POUR BTL (IV SOLUTION) ×2 IMPLANT
PACK BASIN MINOR ARMC (MISCELLANEOUS) ×2 IMPLANT
PAD GROUND ADULT SPLIT (MISCELLANEOUS) ×2 IMPLANT
SUT PROLENE 0 CT 2 (SUTURE) ×4 IMPLANT
SUT VIC AB 2-0 SH 27 (SUTURE) ×1
SUT VIC AB 2-0 SH 27XBRD (SUTURE) ×1 IMPLANT
SUT VIC AB 3-0 SH 27 (SUTURE) ×1
SUT VIC AB 3-0 SH 27X BRD (SUTURE) ×1 IMPLANT
SUT VIC AB 4-0 FS2 27 (SUTURE) ×2 IMPLANT
SUT VICRYL+ 3-0 144IN (SUTURE) ×2 IMPLANT
SYR CONTROL 10ML (SYRINGE) ×2 IMPLANT

## 2015-07-29 NOTE — Anesthesia Postprocedure Evaluation (Signed)
Anesthesia Post Note  Patient: Gaetano NetSamuel Sanjurjo  Procedure(s) Performed: Procedure(s) (LRB): HERNIA REPAIR UMBILICAL ADULT (N/A)  Patient location during evaluation: PACU Anesthesia Type: General Level of consciousness: awake and alert Pain management: pain level controlled Vital Signs Assessment: post-procedure vital signs reviewed and stable Respiratory status: spontaneous breathing, nonlabored ventilation, respiratory function stable and patient connected to nasal cannula oxygen Cardiovascular status: blood pressure returned to baseline and stable Postop Assessment: no signs of nausea or vomiting Anesthetic complications: no    Last Vitals:  Filed Vitals:   07/29/15 1230 07/29/15 1236  BP: 140/95 124/78  Pulse: 60 64  Temp: 36.3 C 36 C  Resp: 16 16    Last Pain:  Filed Vitals:   07/29/15 1238  PainSc: 0-No pain                 Cleda MccreedyJoseph K Aurorah Schlachter

## 2015-07-29 NOTE — H&P (View-Only) (Signed)
Patient ID: Christopher Burns, male   DOB: 1971/03/05, 44 y.o.   MRN: [id number]  Chief Complaint  Patient presents with  . Pre-op Exam    HPI Christopher NetSamuel Gillin is a 44 y.o. male here today for his pre op umbilical hernia repair scheduled on 07/29/15. No new complaints. He recently traveled to [place] and noted some mild respiratory symptoms Better now. I have reviewed the history of present illness with the patient.  HPI  Past Medical History  Diagnosis Date  . Hypercholesteremia   . High cholesterol   . Dizziness   . HA (headache)   . Hemorrhoid   . Asthma   . Sleep apnea     NO CPAP  . GERD (gastroesophageal reflux disease)   . Complication of anesthesia     HICCUPS FOR 1 WEEK AFTER BICEP SURGERY    Past Surgical History  Procedure Laterality Date  . Knee surgery Right   . Tonsillectomy    . Vasotomy    . Bisep Left 2007  . Lasik      Family History  Problem Relation Age of Onset  . Cancer Mother     breast  . Brain cancer Mother   . High Cholesterol Mother   . Prostate cancer Paternal Grandfather   . Cancer Maternal Grandmother   . Cancer Maternal Grandfather   . Diabetes Father   . High blood pressure Father   . High Cholesterol Father     Social History Social History  Substance Use Topics  . Smoking status: Never Smoker   . Smokeless tobacco: Never Used  . Alcohol Use: 1.2 oz/week    2 Shots of liquor per week     Comment: OCC. Kooler    No Known Allergies  Current Outpatient Prescriptions  Medication Sig Dispense Refill  . Cholecalciferol (VITAMIN D) 2000 UNITS tablet Take 2,000 Units by mouth daily.    . diclofenac (VOLTAREN) 75 MG EC tablet Take 1 tablet (75 mg total) by mouth 2 (two) times daily. 50 tablet 2  . EPIPEN 2-PAK 0.3 MG/0.3ML SOAJ injection See admin instructions.  1  . esomeprazole (NEXIUM) 40 MG capsule Take 40 mg by mouth at bedtime.     . Fluticasone Furoate-Vilanterol (BREO ELLIPTA) 200-25 MCG/INH AEPB Inhale 1 puff into the  lungs every morning.     Marland Kitchen. ibuprofen (ADVIL,MOTRIN) 200 MG tablet Take 600 mg by mouth every 6 (six) hours as needed for headache or mild pain.    . L-ARGININE PO Take 1 tablet by mouth daily.     . montelukast (SINGULAIR) 10 MG tablet Take 10 mg by mouth at bedtime.   11  . Multiple Vitamins-Minerals (MULTIVITAMIN WITH MINERALS) tablet Take 1 tablet by mouth daily.    Marland Kitchen. omega-3 acid ethyl esters (LOVAZA) 1 G capsule Take 1 g by mouth daily.    . simvastatin (ZOCOR) 40 MG tablet Take 40 mg by mouth at bedtime.     . VENTOLIN HFA 108 (90 BASE) MCG/ACT inhaler USE 2 PUFFS 4 TIMES A DAY AS NEEDED  0   No current facility-administered medications for this visit.    Review of Systems Review of Systems  Constitutional: Negative.   Respiratory: Negative.   Cardiovascular: Negative.     Blood pressure 124/72, pulse 76, resp. rate 12, height 5\' 11"  (1.803 m), weight 273 lb (123.832 kg).  Physical Exam Physical Exam  Constitutional: He is oriented to person, place, and time. He appears well-developed and well-nourished.  Eyes: Conjunctivae  are normal. No scleral icterus.  Neck: Neck supple.  Cardiovascular: Normal rate, regular rhythm and normal heart sounds.   Pulmonary/Chest: Effort normal and breath sounds normal.  Abdominal: Soft. Normal appearance and bowel sounds are normal. He exhibits no distension. There is no hepatomegaly. There is no tenderness. A hernia (3cm visible and palpable hernia at umbilicus) is present.  Lymphadenopathy:    He has no cervical adenopathy.  Neurological: He is alert and oriented to person, place, and time.  Skin: Skin is warm and dry.    Data Reviewed Progress notes reviewed  Assessment    Umbilical Hernia, scheduled for repair in 2 weeks.      Plan    Patient is scheduled for umbilical hernia surgery on 07/29/15.      Hernia precautions and incarceration were discussed with the patient. If they develop symptoms of an incarcerated hernia, they  were encouraged to seek prompt medical attention.  I have recommended repair of the hernia using mesh on an outpatient basis in the near future. The risk of infection was reviewed. The role of prosthetic mesh to minimize the risk of recurrence was reviewed.  Talal Fritchman G 07/16/2015, 1:53 PM    

## 2015-07-29 NOTE — Anesthesia Procedure Notes (Signed)
Procedure Name: LMA Insertion Date/Time: 07/29/2015 11:13 AM Performed by: Irving BurtonBACHICH, Elienai Gailey Pre-anesthesia Checklist: Patient identified, Emergency Drugs available, Suction available and Patient being monitored Patient Re-evaluated:Patient Re-evaluated prior to inductionOxygen Delivery Method: Circle system utilized Preoxygenation: Pre-oxygenation with 100% oxygen Intubation Type: IV induction LMA: LMA inserted LMA Size: 5.0 Number of attempts: 1 Placement Confirmation: positive ETCO2 and breath sounds checked- equal and bilateral Tube secured with: Tape Dental Injury: Teeth and Oropharynx as per pre-operative assessment

## 2015-07-29 NOTE — Transfer of Care (Signed)
2Immediate Anesthesia Transfer of Care Note  Patient: Christopher Burns Isidro  Procedure(s) Performed: Procedure(s): HERNIA REPAIR UMBILICAL ADULT (N/A)  Patient Location: PACU  Anesthesia Type:General  Level of Consciousness: sedated  Airway & Oxygen Therapy: Patient Spontanous Breathing and Patient connected to face mask oxygen  Post-op Assessment: Report given to RN and Post -op Vital signs reviewed and stable  Post vital signs: Reviewed and stable  Last Vitals:  Filed Vitals:   07/29/15 1011 07/29/15 1147  BP: 161/93 118/62  Pulse: 71 70  Temp: 36.7 C 36.6 C  Resp: 16 11    Complications: No apparent anesthesia complications

## 2015-07-29 NOTE — Op Note (Signed)
Preop diagnosis: Irreducible umbilical hernia  Post op diagnosis: Same  Operation: Repair of umbilical hernia  Surgeon: S.G.Saban Heinlen   Assistant:     Anesthesia: Gen.  Complications: None  EBL: Minimal  Drains: None  Description: Patient was put to sleep with an LMA and the umbilical area was prepped and draped out as sterile field and timeout was performed. Patient had a 2-3 cm size irreducible hernia on the inferior lip of the umbilicus. This area was infiltrated with 0.5% Marcaine and a curved incision was made along the inferior lip of the umbilicus. On entering the subcutaneous space a somewhat firm incarcerated fatty tissue was encountered which measured approximately 3 cm long and about 3 cm wide with a very narrow neck and a fascial opening that was little less than a centimeter in size. With careful dissection this was freed all the way down to the fascia and along the fascial edge was carefully cauterized removed. No bowel or peritoneal sac was identified. Given the tiny opening the muscle the mesh was not utilized. The opening the fascia was closed with a figure-of-eight stitch of 0 proline. Wound was irrigated and closed with subcuticular 4-0 Vicryl covered with liqui  ban. Patient subsequently returned recovery room stable condition

## 2015-07-29 NOTE — Discharge Instructions (Signed)
AMBULATORY SURGERY  DISCHARGE INSTRUCTIONS   1) The drugs that you were given will stay in your system until tomorrow so for the next 24 hours you should not:  A) Drive an automobile B) Make any legal decisions C) Drink any alcoholic beverage   2) You may resume regular meals tomorrow.  Today it is better to start with liquids and gradually work up to solid foods.  You may eat anything you prefer, but it is better to start with liquids, then soup and crackers, and gradually work up to solid foods.   3) Please notify your doctor immediately if you have any unusual bleeding, trouble breathing, redness and pain at the surgery site, drainage, fever, or pain not relieved by medication.    4) Additional Instructions:        Please contact your physician with any problems or Same Day Surgery at [phone], Monday through Friday 6 am to 4 pm, or  at [facility] Main number at [phone]. Open Hernia Repair, Care After These instructions give you information about caring for yourself after your procedure. Your doctor may also give you more specific instructions. Call your doctor if you have any problems or questions after your procedure. HOME CARE  Keep the cut (incision) area clean and dry. You may gently wash the incision area with soap and water 48 hours after surgery. To dry the incision area, gently blot or dab it.  Do not take baths, swim, or use a hot tub for 10 days or until your doctor approves.  Change bandages (dressings) as told by your doctor.  Check your incision area every day for signs of infection. Watch for:  Redness, swelling, or pain.  Fluid , blood, or pus.  Eat plenty of fruits and vegetables. This helps to prevent constipation.  Drink enough fluid to keep your pee (urine) clear or pale yellow. This also helps to prevent constipation.  Do not drive or operate heavy machinery until your doctor says it is okay.  Do not lift anything that is  heavier than 10 lb (4.5 kg) until your doctor approves.  Do not play contact sports for 4 weeks or until your doctor approves.  Take medicines only as told by your doctor.  Keep all follow-up visits as told by your doctor. This is important. Ask your doctor when to make an appointment to have your stitches (sutures) or staples removed. GET HELP IF:  The incision is bleeding more than before.  You have blood in your poop (stool).  The incision hurts more than before.  You have redness, swelling, or pain in your incision area.  You have fluid, blood, or pus coming from your incision.  You have a fever.  You notice a bad smell coming from the incision area or the dressing. GET HELP RIGHT AWAY IF:  You have a rash.  Your chest hurts.  You are short of breath.  You feel light-headed.  You feel weak and dizzy (feel faint).   This information is not intended to replace advice given to you by your health care provider. Make sure you discuss any questions you have with your health care provider.   Document Released: 08/22/2014 Document Reviewed: 08/22/2014 Elsevier Interactive Patient Education 2016 [employer]  Open Hernia Repair, Care After These instructions give you information about caring for yourself after your procedure. Your doctor may also give you more specific instructions. Call your doctor if you have any problems or questions after your procedure. HOME CARE  Keep the cut (incision) area clean and dry. You may gently wash the incision area with soap and water 48 hours after surgery. To dry the incision area, gently blot or dab it.  Do not take baths, swim, or use a hot tub for 10 days or until your doctor approves.  Change bandages (dressings) as told by your doctor.  Check your incision area every day for signs of infection. Watch for:  Redness, swelling, or pain.  Fluid , blood, or pus.  Eat plenty of fruits and vegetables. This helps to prevent  constipation.  Drink enough fluid to keep your pee (urine) clear or pale yellow. This also helps to prevent constipation.  Do not drive or operate heavy machinery until your doctor says it is okay.  Do not lift anything that is heavier than 10 lb (4.5 kg) until your doctor approves.  Do not play contact sports for 4 weeks or until your doctor approves.  Take medicines only as told by your doctor.  Keep all follow-up visits as told by your doctor. This is important. Ask your doctor when to make an appointment to have your stitches (sutures) or staples removed. GET HELP IF:  The incision is bleeding more than before.  You have blood in your poop (stool).  The incision hurts more than before.  You have redness, swelling, or pain in your incision area.  You have fluid, blood, or pus coming from your incision.  You have a fever.  You notice a bad smell coming from the incision area or the dressing. GET HELP RIGHT AWAY IF:  You have a rash.  Your chest hurts.  You are short of breath.  You feel light-headed.  You feel weak and dizzy (feel faint).   This information is not intended to replace advice given to you by your health care provider. Make sure you discuss any questions you have with your health care provider.   Document Released: 08/22/2014 Document Reviewed: 08/22/2014 Elsevier Interactive Patient Education [employer]

## 2015-07-29 NOTE — Interval H&P Note (Signed)
History and Physical Interval Note:  07/29/2015 10:47 AM  Christopher Burns  has presented today for surgery, with the diagnosis of UMBILICAL HERNIA  The various methods of treatment have been discussed with the patient and family. After consideration of risks, benefits and other options for treatment, the patient has consented to  Procedure(s): HERNIA REPAIR UMBILICAL ADULT (N/A) as a surgical intervention .  The patient's history has been reviewed, patient examined, no change in status, stable for surgery.  I have reviewed the patient's chart and labs.  Questions were answered to the patient's satisfaction.     [name] G

## 2015-07-29 NOTE — Anesthesia Preprocedure Evaluation (Addendum)
Anesthesia Evaluation  Patient identified by MRN, date of birth, ID band Patient awake    Reviewed: Allergy & Precautions, H&P , NPO status , Patient's Chart, lab work & pertinent test results  History of Anesthesia Complications (+) history of anesthetic complications  Airway Mallampati: III  TM Distance: >3 FB Neck ROM: full    Dental  (+) Implants   Pulmonary neg shortness of breath, asthma , sleep apnea ,    Pulmonary exam normal breath sounds clear to auscultation       Cardiovascular Exercise Tolerance: Good (-) angina(-) Past MI and (-) DOE Normal cardiovascular exam Rhythm:regular Rate:Normal     Neuro/Psych  Headaches, negative psych ROS   GI/Hepatic Neg liver ROS, GERD  Controlled,  Endo/Other  negative endocrine ROS  Renal/GU negative Renal ROS  negative genitourinary   Musculoskeletal   Abdominal   Peds  Hematology negative hematology ROS (+)   Anesthesia Other Findings Past Medical History:   Hypercholesteremia                                           High cholesterol                                             Dizziness                                                    HA (headache)                                                Hemorrhoid                                                   Asthma                                                       Sleep apnea                                                    Comment:NO CPAP   GERD (gastroesophageal reflux disease)                       Complication of anesthesia                                     Comment:HICCUPS FOR 1 WEEK AFTER BICEP SURGERY  Past Surgical History:   KNEE SURGERY  Right              TONSILLECTOMY                                                 VASOTOMY                                                      bisep                                           Left 2007         LASIK                                                            Reproductive/Obstetrics negative OB ROS                             Anesthesia Physical Anesthesia Plan  ASA: III  Anesthesia Plan: General LMA and MAC   Post-op Pain Management:    Induction:   Airway Management Planned:   Additional Equipment:   Intra-op Plan:   Post-operative Plan:   Informed Consent: I have reviewed the patients History and Physical, chart, labs and discussed the procedure including the risks, benefits and alternatives for the proposed anesthesia with the patient or authorized representative who has indicated his/her understanding and acceptance.   Dental Advisory Given  Plan Discussed with: Anesthesiologist, CRNA and Surgeon  Anesthesia Plan Comments:        Anesthesia Quick Evaluation

## 2015-08-05 ENCOUNTER — Ambulatory Visit (INDEPENDENT_AMBULATORY_CARE_PROVIDER_SITE_OTHER): Payer: 59 | Admitting: General Surgery

## 2015-08-05 ENCOUNTER — Encounter: Payer: Self-pay | Admitting: General Surgery

## 2015-08-05 VITALS — BP 132/84 | HR 76 | Resp 14 | Ht 71.0 in | Wt 274.0 lb

## 2015-08-05 DIAGNOSIS — K420 Umbilical hernia with obstruction, without gangrene: Secondary | ICD-10-CM

## 2015-08-05 NOTE — Patient Instructions (Signed)
Gradually increase activity. The patient is aware to call back for any questions or concerns. 

## 2015-08-05 NOTE — Progress Notes (Signed)
This is a 44 year old male here today for his post op umbilical hernia repair done on 07/29/15. He states he is doing well. I have reviewed the history of present illness with the patient.  Incision clean and healing, repair intact. Abdomen is soft, non tender. He had only a 1cm fascial defect, repaired with prolene  Gradually increase activity. Follow up in 1 months The patient is aware to call back for any questions or concerns.   PCP:  Corky DownsMasoud, Javed This information has been scribed by Dorathy DaftMarsha Hatch RNBC.

## 2015-09-08 ENCOUNTER — Ambulatory Visit: Payer: 59 | Admitting: General Surgery

## 2015-09-17 ENCOUNTER — Other Ambulatory Visit: Payer: Self-pay | Admitting: Podiatry

## 2015-09-18 NOTE — Telephone Encounter (Signed)
Pt will need to be seen prior to future refills. 

## 2015-09-28 ENCOUNTER — Ambulatory Visit (INDEPENDENT_AMBULATORY_CARE_PROVIDER_SITE_OTHER): Payer: 59 | Admitting: General Surgery

## 2015-09-28 ENCOUNTER — Encounter: Payer: Self-pay | Admitting: General Surgery

## 2015-09-28 VITALS — BP 140/80 | HR 72 | Resp 12 | Ht 71.0 in | Wt 276.0 lb

## 2015-09-28 DIAGNOSIS — K420 Umbilical hernia with obstruction, without gangrene: Secondary | ICD-10-CM

## 2015-09-28 NOTE — Progress Notes (Signed)
This is a 45 year old male here today for his post op umbilical hernia repair done on 07/29/15. He states he is doing well. Everything healed up.  I have reviewed the history of present illness with the patient. Umbilical incision is healed. Repair is intact.    Call if any problems in the future.      PCP:  Masoud This information has been scribed by Ples Specter CMA.

## 2015-09-28 NOTE — Patient Instructions (Signed)
Patient to return as needed. 

## 2015-11-14 ENCOUNTER — Other Ambulatory Visit: Payer: Self-pay | Admitting: Podiatry

## 2015-11-17 NOTE — Telephone Encounter (Signed)
Pt needs an appt prior to refills. 

## 2016-09-02 DIAGNOSIS — M546 Pain in thoracic spine: Secondary | ICD-10-CM | POA: Diagnosis not present

## 2016-09-02 DIAGNOSIS — M25562 Pain in left knee: Secondary | ICD-10-CM | POA: Diagnosis not present

## 2016-09-02 DIAGNOSIS — M545 Low back pain: Secondary | ICD-10-CM | POA: Diagnosis not present

## 2017-01-16 DIAGNOSIS — J3089 Other allergic rhinitis: Secondary | ICD-10-CM | POA: Diagnosis not present

## 2017-01-16 DIAGNOSIS — J4520 Mild intermittent asthma, uncomplicated: Secondary | ICD-10-CM | POA: Diagnosis not present

## 2017-01-16 DIAGNOSIS — J3081 Allergic rhinitis due to animal (cat) (dog) hair and dander: Secondary | ICD-10-CM | POA: Diagnosis not present

## 2017-01-16 DIAGNOSIS — J301 Allergic rhinitis due to pollen: Secondary | ICD-10-CM | POA: Diagnosis not present

## 2017-02-03 DIAGNOSIS — J301 Allergic rhinitis due to pollen: Secondary | ICD-10-CM | POA: Diagnosis not present

## 2017-02-03 DIAGNOSIS — J3081 Allergic rhinitis due to animal (cat) (dog) hair and dander: Secondary | ICD-10-CM | POA: Diagnosis not present

## 2017-02-06 DIAGNOSIS — J3089 Other allergic rhinitis: Secondary | ICD-10-CM | POA: Diagnosis not present

## 2017-02-08 DIAGNOSIS — J3089 Other allergic rhinitis: Secondary | ICD-10-CM | POA: Diagnosis not present

## 2017-02-08 DIAGNOSIS — J3081 Allergic rhinitis due to animal (cat) (dog) hair and dander: Secondary | ICD-10-CM | POA: Diagnosis not present

## 2017-02-08 DIAGNOSIS — J301 Allergic rhinitis due to pollen: Secondary | ICD-10-CM | POA: Diagnosis not present

## 2017-04-25 DIAGNOSIS — Z0000 Encounter for general adult medical examination without abnormal findings: Secondary | ICD-10-CM | POA: Diagnosis not present

## 2017-04-27 DIAGNOSIS — I10 Essential (primary) hypertension: Secondary | ICD-10-CM | POA: Diagnosis not present

## 2017-04-27 DIAGNOSIS — E034 Atrophy of thyroid (acquired): Secondary | ICD-10-CM | POA: Diagnosis not present

## 2017-04-27 DIAGNOSIS — E784 Other hyperlipidemia: Secondary | ICD-10-CM | POA: Diagnosis not present

## 2017-05-08 DIAGNOSIS — G4733 Obstructive sleep apnea (adult) (pediatric): Secondary | ICD-10-CM | POA: Diagnosis not present

## 2017-05-19 DIAGNOSIS — E785 Hyperlipidemia, unspecified: Secondary | ICD-10-CM | POA: Diagnosis not present

## 2017-05-19 DIAGNOSIS — G4733 Obstructive sleep apnea (adult) (pediatric): Secondary | ICD-10-CM | POA: Diagnosis not present

## 2017-05-19 DIAGNOSIS — D72819 Decreased white blood cell count, unspecified: Secondary | ICD-10-CM | POA: Diagnosis not present

## 2017-05-29 DIAGNOSIS — G4733 Obstructive sleep apnea (adult) (pediatric): Secondary | ICD-10-CM | POA: Diagnosis not present

## 2017-06-09 DIAGNOSIS — G4733 Obstructive sleep apnea (adult) (pediatric): Secondary | ICD-10-CM | POA: Diagnosis not present

## 2017-06-20 DIAGNOSIS — D72828 Other elevated white blood cell count: Secondary | ICD-10-CM | POA: Diagnosis not present

## 2017-06-30 ENCOUNTER — Telehealth: Payer: Self-pay

## 2017-06-30 ENCOUNTER — Other Ambulatory Visit: Payer: Self-pay

## 2017-06-30 DIAGNOSIS — Z1212 Encounter for screening for malignant neoplasm of rectum: Principal | ICD-10-CM

## 2017-06-30 DIAGNOSIS — Z1211 Encounter for screening for malignant neoplasm of colon: Secondary | ICD-10-CM

## 2017-06-30 NOTE — Telephone Encounter (Signed)
Gastroenterology Pre-Procedure Review  Request Date:  Requesting Physician: Dr.   PATIENT REVIEW QUESTIONS: The patient responded to the following health history questions as indicated:    1. Are you having any GI issues? no 2. Do you have a personal history of Polyps? no 3. Do you have a family history of Colon Cancer or Polyps? no 4. Diabetes Mellitus? no 5. Joint replacements in the past 12 months? no 6. Major health problems in the past 3 months? no 7. Any artificial heart valves, MVP, or defibrillator? no    MEDICATIONS & ALLERGIES:    Patient reports the following regarding taking any anticoagulation/antiplatelet therapy:   Plavix, Coumadin, Eliquis, Xarelto, Lovenox, Pradaxa, Brilinta, or Effient? no Aspirin? Yes, 81 mg  Patient confirms/reports the following medications:  Current Outpatient Medications  Medication Sig Dispense Refill  . Cholecalciferol (VITAMIN D) 2000 UNITS tablet Take 2,000 Units by mouth daily.    . diclofenac (VOLTAREN) 75 MG EC tablet TAKE 1 TABLET (75 MG TOTAL) BY MOUTH 2 (TWO) TIMES DAILY. 50 tablet 0  . EPIPEN 2-PAK 0.3 MG/0.3ML SOAJ injection See admin instructions.  1  . esomeprazole (NEXIUM) 40 MG capsule Take 40 mg by mouth at bedtime.     . Fluticasone Furoate-Vilanterol (BREO ELLIPTA) 200-25 MCG/INH AEPB Inhale 1 puff into the lungs every morning.     Marland Kitchen. ibuprofen (ADVIL,MOTRIN) 200 MG tablet Take 600 mg by mouth every 6 (six) hours as needed for headache or mild pain.    . L-ARGININE PO Take 1 tablet by mouth daily.     . montelukast (SINGULAIR) 10 MG tablet Take 10 mg by mouth at bedtime.   11  . Multiple Vitamins-Minerals (MULTIVITAMIN WITH MINERALS) tablet Take 1 tablet by mouth daily.    Marland Kitchen. omega-3 acid ethyl esters (LOVAZA) 1 G capsule Take 1 g by mouth daily.    . simvastatin (ZOCOR) 40 MG tablet Take 40 mg by mouth at bedtime.     . VENTOLIN HFA 108 (90 BASE) MCG/ACT inhaler USE 2 PUFFS 4 TIMES A DAY AS NEEDED  0   No current  facility-administered medications for this visit.     Patient confirms/reports the following allergies:  Allergies  Allergen Reactions  . Shrimp [Shellfish Allergy] Swelling    No orders of the defined types were placed in this encounter.   AUTHORIZATION INFORMATION Primary Insurance: 1D#: Group #:  Secondary Insurance: 1D#: Group #:  SCHEDULE INFORMATION: Date: 07/31/17 Time: Location: [facility]

## 2017-07-10 DIAGNOSIS — G4733 Obstructive sleep apnea (adult) (pediatric): Secondary | ICD-10-CM | POA: Diagnosis not present

## 2017-07-27 ENCOUNTER — Telehealth: Payer: Self-pay | Admitting: Gastroenterology

## 2017-07-27 NOTE — Telephone Encounter (Signed)
Please call patient to reschedule his appt that is on the 17th.

## 2017-07-31 ENCOUNTER — Encounter: Admission: RE | Payer: Self-pay | Source: Ambulatory Visit

## 2017-07-31 ENCOUNTER — Ambulatory Visit: Admission: RE | Admit: 2017-07-31 | Payer: 59 | Source: Ambulatory Visit | Admitting: Gastroenterology

## 2017-07-31 SURGERY — COLONOSCOPY WITH PROPOFOL
Anesthesia: General

## 2017-08-09 DIAGNOSIS — G4733 Obstructive sleep apnea (adult) (pediatric): Secondary | ICD-10-CM | POA: Diagnosis not present

## 2017-09-20 DIAGNOSIS — J3081 Allergic rhinitis due to animal (cat) (dog) hair and dander: Secondary | ICD-10-CM | POA: Diagnosis not present

## 2017-09-20 DIAGNOSIS — J301 Allergic rhinitis due to pollen: Secondary | ICD-10-CM | POA: Diagnosis not present

## 2017-09-21 DIAGNOSIS — J3089 Other allergic rhinitis: Secondary | ICD-10-CM | POA: Diagnosis not present

## 2017-09-28 DIAGNOSIS — J301 Allergic rhinitis due to pollen: Secondary | ICD-10-CM | POA: Diagnosis not present

## 2017-09-28 DIAGNOSIS — J3089 Other allergic rhinitis: Secondary | ICD-10-CM | POA: Diagnosis not present

## 2017-10-02 DIAGNOSIS — G4733 Obstructive sleep apnea (adult) (pediatric): Secondary | ICD-10-CM | POA: Diagnosis not present

## 2017-10-02 DIAGNOSIS — E785 Hyperlipidemia, unspecified: Secondary | ICD-10-CM | POA: Diagnosis not present

## 2017-10-02 DIAGNOSIS — I10 Essential (primary) hypertension: Secondary | ICD-10-CM | POA: Diagnosis not present

## 2017-10-09 DIAGNOSIS — G4733 Obstructive sleep apnea (adult) (pediatric): Secondary | ICD-10-CM | POA: Diagnosis not present

## 2017-11-06 DIAGNOSIS — G4733 Obstructive sleep apnea (adult) (pediatric): Secondary | ICD-10-CM | POA: Diagnosis not present

## 2017-11-30 DIAGNOSIS — E7849 Other hyperlipidemia: Secondary | ICD-10-CM | POA: Diagnosis not present

## 2017-11-30 DIAGNOSIS — I10 Essential (primary) hypertension: Secondary | ICD-10-CM | POA: Diagnosis not present

## 2017-11-30 DIAGNOSIS — E034 Atrophy of thyroid (acquired): Secondary | ICD-10-CM | POA: Diagnosis not present

## 2017-12-05 DIAGNOSIS — Z0000 Encounter for general adult medical examination without abnormal findings: Secondary | ICD-10-CM | POA: Diagnosis not present

## 2017-12-07 DIAGNOSIS — G4733 Obstructive sleep apnea (adult) (pediatric): Secondary | ICD-10-CM | POA: Diagnosis not present

## 2017-12-28 ENCOUNTER — Ambulatory Visit: Payer: 59 | Admitting: Podiatry

## 2018-01-06 DIAGNOSIS — G4733 Obstructive sleep apnea (adult) (pediatric): Secondary | ICD-10-CM | POA: Diagnosis not present

## 2018-01-10 ENCOUNTER — Ambulatory Visit: Payer: 59 | Admitting: Podiatry

## 2018-01-10 ENCOUNTER — Encounter: Payer: Self-pay | Admitting: Podiatry

## 2018-01-10 ENCOUNTER — Other Ambulatory Visit: Payer: Self-pay | Admitting: Podiatry

## 2018-01-10 ENCOUNTER — Ambulatory Visit (INDEPENDENT_AMBULATORY_CARE_PROVIDER_SITE_OTHER): Payer: 59

## 2018-01-10 DIAGNOSIS — M779 Enthesopathy, unspecified: Secondary | ICD-10-CM | POA: Diagnosis not present

## 2018-01-10 DIAGNOSIS — M79671 Pain in right foot: Secondary | ICD-10-CM

## 2018-01-10 DIAGNOSIS — L84 Corns and callosities: Secondary | ICD-10-CM

## 2018-01-10 DIAGNOSIS — M778 Other enthesopathies, not elsewhere classified: Secondary | ICD-10-CM

## 2018-01-10 MED ORDER — TRIAMCINOLONE ACETONIDE 10 MG/ML IJ SUSP
10.0000 mg | Freq: Once | INTRAMUSCULAR | Status: AC
  Administered 2018-01-10: 10 mg

## 2018-01-10 NOTE — Progress Notes (Signed)
Subjective:   Patient ID: Christopher Burns, male   DOB: 47 y.o.   MRN: [id number]   HPI Patient presents stating to have a lot of pain around his fifth metatarsal head right and is making it hard for him to walk comfortably   ROS      Objective:  Physical Exam  Neurovascular status intact with inflammation around the fifth MPJ right with keratotic lesion formations that are painful     Assessment:  Probability that this is related to bone structure with inflammation fluid around the fifth MPJ     Plan:  H&P condition reviewed injected the capsule 3 mg Kenalog 5 mg Xylocaine debrided the lesions and advised on possible orthotics for fifth metatarsal head resection at one point future  X-ray indicates that the patient has lesions that are directly on the fifth metatarsal head right

## 2018-02-06 DIAGNOSIS — G4733 Obstructive sleep apnea (adult) (pediatric): Secondary | ICD-10-CM | POA: Diagnosis not present

## 2018-03-08 DIAGNOSIS — G4733 Obstructive sleep apnea (adult) (pediatric): Secondary | ICD-10-CM | POA: Diagnosis not present

## 2018-03-13 DIAGNOSIS — J3081 Allergic rhinitis due to animal (cat) (dog) hair and dander: Secondary | ICD-10-CM | POA: Diagnosis not present

## 2018-03-13 DIAGNOSIS — J301 Allergic rhinitis due to pollen: Secondary | ICD-10-CM | POA: Diagnosis not present

## 2018-03-14 DIAGNOSIS — J3089 Other allergic rhinitis: Secondary | ICD-10-CM | POA: Diagnosis not present

## 2018-03-19 DIAGNOSIS — J45901 Unspecified asthma with (acute) exacerbation: Secondary | ICD-10-CM | POA: Diagnosis not present

## 2018-03-19 DIAGNOSIS — G4733 Obstructive sleep apnea (adult) (pediatric): Secondary | ICD-10-CM | POA: Diagnosis not present

## 2018-03-19 DIAGNOSIS — I10 Essential (primary) hypertension: Secondary | ICD-10-CM | POA: Diagnosis not present

## 2018-03-28 DIAGNOSIS — J3081 Allergic rhinitis due to animal (cat) (dog) hair and dander: Secondary | ICD-10-CM | POA: Diagnosis not present

## 2018-03-28 DIAGNOSIS — J301 Allergic rhinitis due to pollen: Secondary | ICD-10-CM | POA: Diagnosis not present

## 2018-03-28 DIAGNOSIS — J3089 Other allergic rhinitis: Secondary | ICD-10-CM | POA: Diagnosis not present

## 2018-04-08 DIAGNOSIS — G4733 Obstructive sleep apnea (adult) (pediatric): Secondary | ICD-10-CM | POA: Diagnosis not present

## 2018-06-05 DIAGNOSIS — G4733 Obstructive sleep apnea (adult) (pediatric): Secondary | ICD-10-CM | POA: Diagnosis not present

## 2018-06-05 DIAGNOSIS — D72819 Decreased white blood cell count, unspecified: Secondary | ICD-10-CM | POA: Diagnosis not present

## 2018-06-05 DIAGNOSIS — E785 Hyperlipidemia, unspecified: Secondary | ICD-10-CM | POA: Diagnosis not present

## 2018-06-25 DIAGNOSIS — J301 Allergic rhinitis due to pollen: Secondary | ICD-10-CM | POA: Diagnosis not present

## 2018-06-25 DIAGNOSIS — J3081 Allergic rhinitis due to animal (cat) (dog) hair and dander: Secondary | ICD-10-CM | POA: Diagnosis not present

## 2018-06-25 DIAGNOSIS — J3089 Other allergic rhinitis: Secondary | ICD-10-CM | POA: Diagnosis not present

## 2018-06-25 DIAGNOSIS — J4520 Mild intermittent asthma, uncomplicated: Secondary | ICD-10-CM | POA: Diagnosis not present

## 2018-09-24 DIAGNOSIS — J3081 Allergic rhinitis due to animal (cat) (dog) hair and dander: Secondary | ICD-10-CM | POA: Diagnosis not present

## 2018-09-24 DIAGNOSIS — R140 Abdominal distension (gaseous): Secondary | ICD-10-CM | POA: Diagnosis not present

## 2018-09-24 DIAGNOSIS — R066 Hiccough: Secondary | ICD-10-CM | POA: Diagnosis not present

## 2018-09-24 DIAGNOSIS — J301 Allergic rhinitis due to pollen: Secondary | ICD-10-CM | POA: Diagnosis not present

## 2018-09-24 DIAGNOSIS — K219 Gastro-esophageal reflux disease without esophagitis: Secondary | ICD-10-CM | POA: Diagnosis not present

## 2018-09-25 DIAGNOSIS — J3089 Other allergic rhinitis: Secondary | ICD-10-CM | POA: Diagnosis not present

## 2018-10-05 DIAGNOSIS — J3081 Allergic rhinitis due to animal (cat) (dog) hair and dander: Secondary | ICD-10-CM | POA: Diagnosis not present

## 2018-10-05 DIAGNOSIS — J301 Allergic rhinitis due to pollen: Secondary | ICD-10-CM | POA: Diagnosis not present

## 2018-10-05 DIAGNOSIS — J3089 Other allergic rhinitis: Secondary | ICD-10-CM | POA: Diagnosis not present

## 2018-11-01 ENCOUNTER — Encounter: Payer: Self-pay | Admitting: *Deleted

## 2018-11-01 ENCOUNTER — Emergency Department
Admission: EM | Admit: 2018-11-01 | Discharge: 2018-11-01 | Disposition: A | Discharge status: Home / Self Care | Payer: 59 | Attending: Emergency Medicine | Admitting: Emergency Medicine

## 2018-11-01 ENCOUNTER — Other Ambulatory Visit: Payer: Self-pay

## 2018-11-01 DIAGNOSIS — Z79899 Other long term (current) drug therapy: Secondary | ICD-10-CM | POA: Insufficient documentation

## 2018-11-01 DIAGNOSIS — K6289 Other specified diseases of anus and rectum: Secondary | ICD-10-CM | POA: Diagnosis present

## 2018-11-01 DIAGNOSIS — K641 Second degree hemorrhoids: Secondary | ICD-10-CM | POA: Insufficient documentation

## 2018-11-01 DIAGNOSIS — J45909 Unspecified asthma, uncomplicated: Secondary | ICD-10-CM | POA: Diagnosis not present

## 2018-11-01 MED ORDER — HYDROCORTISONE ACETATE 25 MG RE SUPP
25.0000 mg | Freq: Once | RECTAL | Status: AC
  Administered 2018-11-01: 25 mg via RECTAL
  Filled 2018-11-01 (×2): qty 1

## 2018-11-01 MED ORDER — DIBUCAINE 1 % RE OINT: 1.0000 "application " | TOPICAL_OINTMENT | Freq: Three times a day (TID) | RECTAL | 0 refills | Status: AC | PRN

## 2018-11-01 MED ORDER — HYDROCODONE-IBUPROFEN 7.5-200 MG PO TABS: 1.0000 | ORAL_TABLET | Freq: Three times a day (TID) | ORAL | 0 refills | Status: DC | PRN

## 2018-11-01 MED ORDER — HYDROCORTISONE ACETATE 25 MG RE SUPP: 25.0000 mg | Freq: Two times a day (BID) | RECTAL | 0 refills | Status: AC

## 2018-11-01 MED ORDER — HYDROCODONE-IBUPROFEN 7.5-200 MG PO TABS: 1.0000 | ORAL_TABLET | Freq: Three times a day (TID) | ORAL | 0 refills | Status: AC | PRN

## 2018-11-01 MED ORDER — LIDOCAINE HCL URETHRAL/MUCOSAL 2 % EX GEL
1.0000 "application " | Freq: Once | CUTANEOUS | Status: AC
  Administered 2018-11-01: 1 via TOPICAL
  Filled 2018-11-01: qty 5

## 2018-11-01 NOTE — ED Triage Notes (Signed)
Pt to ED reporting hemorrhoids that he noticed two days ago that have been increasing in size. Pt has needed them lanced in the past to remove blood clots. Pt reporting they have been bleeding since yesterday when he is wiping.

## 2018-11-01 NOTE — ED Triage Notes (Signed)
States his hemorrhoids are back.

## 2018-11-01 NOTE — ED Provider Notes (Signed)
[facility] Emergency Department Provider Note ____________________________________________  Time seen: 1456  I have reviewed the triage vital signs and the nursing notes.  HISTORY  Chief Complaint  Hemorrhoids  HPI Christopher Burns is a 48 y.o. male presents himself to the ED for evaluation of acute rectal pain.  Patient with a history of hemorrhoids, presents with an acute flare of the same.  He reports some bleeding from the rectum associated with bowel movements.  He denies any nausea, vomiting, or dark tarry stools.  He does admit to some firm stools over the last few bowel movements.  Denies any nausea, vomiting, abdominal pain, or diarrhea.  Past Medical History:  Diagnosis Date  . Asthma   . Complication of anesthesia    HICCUPS FOR 1 WEEK AFTER BICEP SURGERY  . Dizziness   . GERD (gastroesophageal reflux disease)   . HA (headache)   . Hemorrhoid   . High cholesterol   . Hypercholesteremia   . Sleep apnea    NO CPAP    Patient Active Problem List   Diagnosis Date Noted  . High cholesterol   . Dizziness     Past Surgical History:  Procedure Laterality Date  . bisep Left 2007  . KNEE SURGERY Right   . LASIK    . TONSILLECTOMY    . UMBILICAL HERNIA REPAIR N/A 07/29/2015   Procedure: HERNIA REPAIR UMBILICAL ADULT;  Surgeon: Kieth Brightly, MD;  Location: [facility] ORS;  Service: General;  Laterality: N/A;  . VASOTOMY      Prior to Admission medications   Medication Sig Start Date End Date Taking? Authorizing Provider  Cholecalciferol (VITAMIN D) 2000 UNITS tablet Take 2,000 Units by mouth daily.    [provider]  dibucaine (NUPERCAINAL) 1 % OINT Place 1 application rectally 3 (three) times daily as needed for up to 10 days. 11/01/18 11/11/18  Santanna Whitford, Charlesetta Ivory, PA-C  diclofenac (VOLTAREN) 75 MG EC tablet TAKE 1 TABLET (75 MG TOTAL) BY MOUTH 2 (TWO) TIMES DAILY. 11/17/15   Regal, Kirstie Peri, DPM  EPIPEN 2-PAK 0.3 MG/0.3ML SOAJ  injection See admin instructions. 01/08/15   [provider]  esomeprazole (NEXIUM) 40 MG capsule Take 40 mg by mouth at bedtime.     [provider]  Fluticasone Furoate-Vilanterol (BREO ELLIPTA) 200-25 MCG/INH AEPB Inhale 1 puff into the lungs every morning.     [provider]  HYDROcodone-ibuprofen (VICOPROFEN) 7.5-200 MG tablet Take 1 tablet by mouth 3 (three) times daily as needed for up to 3 days for moderate pain. 11/01/18 11/04/18  Kathaleya Mcduffee, Charlesetta Ivory, PA-C  hydrocortisone (ANUSOL-HC) 25 MG suppository Place 1 suppository (25 mg total) rectally every 12 (twelve) hours for 6 days. 11/01/18 11/07/18  Jakyle Petrucelli, Charlesetta Ivory, PA-C  ibuprofen (ADVIL,MOTRIN) 200 MG tablet Take 600 mg by mouth every 6 (six) hours as needed for headache or mild pain.    [provider]  L-ARGININE PO Take 1 tablet by mouth daily.     [provider]  lisinopril (PRINIVIL,ZESTRIL) 10 MG tablet Take 10 mg daily by mouth. 05/30/17   [provider]  montelukast (SINGULAIR) 10 MG tablet Take 10 mg by mouth at bedtime.  06/12/14   [provider]  Multiple Vitamins-Minerals (MULTIVITAMIN WITH MINERALS) tablet Take 1 tablet by mouth daily.    [provider]  omega-3 acid ethyl esters (LOVAZA) 1 G capsule Take 1 g by mouth daily.    [provider]  simvastatin (  ZOCOR) 10 MG tablet Take 10 mg daily by mouth. 06/25/17   [provider]  simvastatin (ZOCOR) 40 MG tablet Take 40 mg by mouth at bedtime.  06/28/14   [provider]  VENTOLIN HFA 108 (90 BASE) MCG/ACT inhaler USE 2 PUFFS 4 TIMES A DAY AS NEEDED 05/07/15   [provider]    Allergies Patient has no active allergies.  Family History  Problem Relation Age of Onset  . Cancer Mother        breast  . Brain cancer Mother   . High Cholesterol Mother   . Diabetes Father   . High blood pressure Father   . High Cholesterol Father   . Prostate cancer  Paternal Grandfather   . Cancer Maternal Grandmother   . Cancer Maternal Grandfather     Social History Social History   Tobacco Use  . Smoking status: Never Smoker  . Smokeless tobacco: Never Used  Substance Use Topics  . Alcohol use: Yes    Alcohol/week: 2.0 standard drinks    Types: 2 Shots of liquor per week    Comment: OCC. Kooler  . Drug use: No    Review of Systems  Constitutional: Negative for fever. Cardiovascular: Negative for chest pain. Respiratory: Negative for shortness of breath. Gastrointestinal: Negative for abdominal pain, vomiting and diarrhea. Genitourinary: Negative for dysuria.  Rectal pain as above. Musculoskeletal: Negative for back pain. Skin: Negative for rash. Neurological: Negative for headaches, focal weakness or numbness. ____________________________________________  PHYSICAL EXAM:  VITAL SIGNS: ED Triage Vitals  Enc Vitals Group     BP 11/01/18 1336 (!) 144/90     Pulse Rate 11/01/18 1336 82     Resp 11/01/18 1336 16     Temp 11/01/18 1336 98.7 F (37.1 C)     Temp Source 11/01/18 1336 Oral     SpO2 11/01/18 1336 96 %     Weight 11/01/18 1337 285 lb (129.3 kg)     Height 11/01/18 1337 5\' 10"  (1.778 m)     Head Circumference --      Peak Flow --      Pain Score 11/01/18 1337 7     Pain Loc --      Pain Edu? --      Excl. in GC? --     Constitutional: Alert and oriented. Well appearing and in no distress. Head: Normocephalic and atraumatic. Eyes: Conjunctivae are normal. Normal extraocular movements Cardiovascular: Normal rate, regular rhythm. Normal distal pulses. Respiratory: Normal respiratory effort.  Gastrointestinal: Soft and nontender.  Rectal exam reveals 2 firm, pink hemorrhoids at the 10 o'clock and 12 o'clock position.  No ecchymosis or incarceration is suspected.  There is some bright red blood about the rectum consistent with a local fissure.  Rectal tone is normal and intact.  No internal hemorrhoids are  appreciated. Musculoskeletal: Nontender with normal range of motion in all extremities.  Neurologic:  Normal gait without ataxia. Normal speech and language. No gross focal neurologic deficits are appreciated. Skin:  Skin is warm, dry and intact. No rash noted. Psychiatric: Mood and affect are normal. Patient exhibits appropriate insight and judgment. ____________________________________________  PROCEDURES  Procedures Rectal suppository 1 PR Lidocaine jelly 2% apply topically ____________________________________________  INITIAL IMPRESSION / ASSESSMENT AND PLAN / ED COURSE  Patient with ED evaluation of acute rectal pain secondary to hemorrhoids.  Patient presentation consistent with an acute flare of hemorrhoids.  He has 2 large hemorrhoids without incarceration or strangulation.  Patient treated  with a rectal suppository and topical analgesic in the ED.  He is discharged with prescriptions for the same.  He will be referred to GI for definitive management as necessary.  He is given infection on high-fiber diet and daily stool softeners.  Return precautions have been reviewed.  I reviewed the patient's prescription history over the last 12 months in the multi-state controlled substances database(s) that includes [place], [place], [place], [place], [place], [place], [place], [place], [place], [place], [place], [place], [place], and [place].  Results were notable for no narcotic prescription history. ____________________________________________  FINAL CLINICAL IMPRESSION(S) / ED DIAGNOSES  Final diagnoses:  Grade II hemorrhoids      Karmen Stabs, Charlesetta Ivory, PA-C 11/01/18 1613    Rockne Menghini, MD 11/01/18 2028

## 2018-11-01 NOTE — ED Notes (Signed)
Lidocaine obtained and given to pa for application.

## 2018-11-01 NOTE — Discharge Instructions (Addendum)
Use the prescription suppository and topical pain gel as directed. Increase your fiber intake and take daily doses of Miralax. Follow-up with GI Medicine as needed.

## 2018-12-05 DIAGNOSIS — G4733 Obstructive sleep apnea (adult) (pediatric): Secondary | ICD-10-CM | POA: Diagnosis not present

## 2019-01-15 ENCOUNTER — Ambulatory Visit: Payer: 59 | Admitting: Sports Medicine

## 2019-01-15 ENCOUNTER — Other Ambulatory Visit: Payer: Self-pay

## 2019-01-15 ENCOUNTER — Encounter: Payer: Self-pay | Admitting: Podiatry

## 2019-01-15 VITALS — Temp 97.3°F

## 2019-01-15 DIAGNOSIS — L84 Corns and callosities: Secondary | ICD-10-CM

## 2019-01-15 DIAGNOSIS — M79671 Pain in right foot: Secondary | ICD-10-CM

## 2019-01-15 DIAGNOSIS — B351 Tinea unguium: Secondary | ICD-10-CM | POA: Diagnosis not present

## 2019-01-15 DIAGNOSIS — M779 Enthesopathy, unspecified: Secondary | ICD-10-CM

## 2019-01-15 MED ORDER — TRIAMCINOLONE ACETONIDE 10 MG/ML IJ SUSP
10.0000 mg | Freq: Once | INTRAMUSCULAR | Status: AC
  Administered 2019-01-15: 10 mg

## 2019-01-15 NOTE — Patient Instructions (Signed)

## 2019-01-15 NOTE — Progress Notes (Signed)
Subjective: Christopher Burns is a 48 y.o. male patient who presents to office for evaluation of 1. Right foot pain secondary to callus skin. Patient complains of pain at the lesion present at the 5th toe joint on the Right foot. Patient has tried softening creams with no relief in symptoms. Reports that it is very tender even with light touch. Using orthotics but still hurts. Patient admits to 2. Thickness and discoloration of toenails and states that he wants to discuss treatments for fungus. He is in work boots or on bikes a lot with [employer]. Admits to dry peeling skin and ocassional itchiness. Denies warmth, open lesions, drainage or any acute symptoms. Patient denies any other pedal complaints.   Patient Active Problem List   Diagnosis Date Noted  . High cholesterol   . Dizziness     Current Outpatient Medications on File Prior to Visit  Medication Sig Dispense Refill  . Cholecalciferol (VITAMIN D) 2000 UNITS tablet Take 2,000 Units by mouth daily.    . diclofenac (VOLTAREN) 75 MG EC tablet TAKE 1 TABLET (75 MG TOTAL) BY MOUTH 2 (TWO) TIMES DAILY. 50 tablet 0  . EPIPEN 2-PAK 0.3 MG/0.3ML SOAJ injection See admin instructions.  1  . esomeprazole (NEXIUM) 40 MG capsule Take 40 mg by mouth at bedtime.     . Fluticasone Furoate-Vilanterol (BREO ELLIPTA) 200-25 MCG/INH AEPB Inhale 1 puff into the lungs every morning.     . hydrocortisone 2.5 % cream APPLY TO AFFECTED AREA TWICE A DAY    . ibuprofen (ADVIL,MOTRIN) 200 MG tablet Take 600 mg by mouth every 6 (six) hours as needed for headache or mild pain.    . L-ARGININE PO Take 1 tablet by mouth daily.     Marland Kitchen lisinopril (PRINIVIL,ZESTRIL) 10 MG tablet Take 10 mg daily by mouth.  4  . montelukast (SINGULAIR) 10 MG tablet Take 10 mg by mouth at bedtime.   11  . Multiple Vitamins-Minerals (MULTIVITAMIN WITH MINERALS) tablet Take 1 tablet by mouth daily.    Marland Kitchen omega-3 acid ethyl esters (LOVAZA) 1 G capsule Take 1 g by mouth daily.    . simvastatin (ZOCOR)  10 MG tablet Take 10 mg daily by mouth.  3  . simvastatin (ZOCOR) 40 MG tablet Take 40 mg by mouth at bedtime.     Marland Kitchen testosterone cypionate (DEPOTESTOSTERONE CYPIONATE) 200 MG/ML injection INJECT 1 ML INTO MUSCLE ONCE A WEEK    . VENTOLIN HFA 108 (90 BASE) MCG/ACT inhaler USE 2 PUFFS 4 TIMES A DAY AS NEEDED  0   No current facility-administered medications on file prior to visit.     No Known Allergies  Objective:  General: Alert and oriented x3 in no acute distress  Dermatology: Keratotic lesion present lateral 5th MTPJ on right with skin lines transversing the lesion, pain is present with direct pressure to the lesion with a central nucleated core noted, no webspace macerations, + scaly skin bilateral, no ecchymosis bilateral, Bilateral hallux and 5th toenails are thick and discolor with subungal debris consistent with mycosis.  Vascular: Dorsalis Pedis and Posterior Tibial pedal pulses 2/4, Capillary Fill Time 3 seconds, + pedal hair growth bilateral, no edema bilateral lower extremities, Temperature gradient within normal limits.  Neurology: Gross sensation intact via light touch bilateral.  Musculoskeletal: Moderate tenderness with palpation at the keratotic lesion site on Right lateral 5th MTPJ, Muscular strength 5/5 in all groups without pain or limitation on range of motion. Varus foot type.  Assessment and Plan: Problem List  Items Addressed This Visit    None    Visit Diagnoses    Nail fungus    -  Primary   Relevant Orders   Culture, fungus without smear   Capsulitis       Relevant Medications   triamcinolone acetonide (KENALOG) 10 MG/ML injection 10 mg (Completed) (Start on 01/15/2019 12:45 PM)   Corns and callosities       Right foot pain          -Complete examination performed -Discussed treatment options -Fungal culture was obtained by removing a portion of the hard nail itself from each 1st and 5th toes of the involved toenails using a sterile nail nipper and [facility]  [facility]. Patient tolerated the biopsy procedure well without discomfort or need for anesthesia. -After oral consent and aseptic prep, injected a mixture containing 1 ml of 2%  plain lidocaine, 1 ml 0.5% plain marcaine, 0.5 ml of kenalog 10 and 0.5 ml of dexamethasone phosphate into Right lateral 5th MTPJ without complication. Post-injection care discussed with patient.  -Parred keratoic lesion Right lateral 5th MTPJ using a chisel blade; treated the area with Salinocaine covered with bandaid -Encouraged daily skin emollients and OTC lamisil spray  -Encouraged use of pumice stone -Advised good supportive shoes and inserts -Patient to return to office in 1 month for fungal culture results or sooner if condition worsens.  Asencion Islamitorya Audrinna Sherman, DPM

## 2019-01-31 ENCOUNTER — Telehealth: Payer: Self-pay

## 2019-01-31 ENCOUNTER — Other Ambulatory Visit: Payer: Self-pay

## 2019-01-31 DIAGNOSIS — Z1211 Encounter for screening for malignant neoplasm of colon: Secondary | ICD-10-CM

## 2019-01-31 DIAGNOSIS — Z800 Family history of malignant neoplasm of digestive organs: Secondary | ICD-10-CM

## 2019-01-31 NOTE — Telephone Encounter (Signed)
Gastroenterology Pre-Procedure Review  Request Date: 03/01/19 Requesting Physician: Dr. Vicente Males  PATIENT REVIEW QUESTIONS: The patient responded to the following health history questions as indicated:    1. Are you having any GI issues? no 2. Do you have a personal history of Polyps? no 3. Do you have a family history of Colon Cancer or Polyps? yes (paternal grandfather) 21. Diabetes Mellitus? no 5. Joint replacements in the past 12 months?no 6. Major health problems in the past 3 months?no 7. Any artificial heart valves, MVP, or defibrillator?no    MEDICATIONS & ALLERGIES:    Patient reports the following regarding taking any anticoagulation/antiplatelet therapy:   Plavix, Coumadin, Eliquis, Xarelto, Lovenox, Pradaxa, Brilinta, or Effient? no Aspirin? no  Patient confirms/reports the following medications:  Current Outpatient Medications  Medication Sig Dispense Refill  . Cholecalciferol (VITAMIN D) 2000 UNITS tablet Take 2,000 Units by mouth daily.    . diclofenac (VOLTAREN) 75 MG EC tablet TAKE 1 TABLET (75 MG TOTAL) BY MOUTH 2 (TWO) TIMES DAILY. 50 tablet 0  . EPIPEN 2-PAK 0.3 MG/0.3ML SOAJ injection See admin instructions.  1  . esomeprazole (NEXIUM) 40 MG capsule Take 40 mg by mouth at bedtime.     . Fluticasone Furoate-Vilanterol (BREO ELLIPTA) 200-25 MCG/INH AEPB Inhale 1 puff into the lungs every morning.     . hydrocortisone 2.5 % cream APPLY TO AFFECTED AREA TWICE A DAY    . ibuprofen (ADVIL,MOTRIN) 200 MG tablet Take 600 mg by mouth every 6 (six) hours as needed for headache or mild pain.    . L-ARGININE PO Take 1 tablet by mouth daily.     Marland Kitchen lisinopril (PRINIVIL,ZESTRIL) 10 MG tablet Take 10 mg daily by mouth.  4  . montelukast (SINGULAIR) 10 MG tablet Take 10 mg by mouth at bedtime.   11  . Multiple Vitamins-Minerals (MULTIVITAMIN WITH MINERALS) tablet Take 1 tablet by mouth daily.    Marland Kitchen omega-3 acid ethyl esters (LOVAZA) 1 G capsule Take 1 g by mouth daily.    .  simvastatin (ZOCOR) 10 MG tablet Take 10 mg daily by mouth.  3  . simvastatin (ZOCOR) 40 MG tablet Take 40 mg by mouth at bedtime.     Marland Kitchen testosterone cypionate (DEPOTESTOSTERONE CYPIONATE) 200 MG/ML injection INJECT 1 ML INTO MUSCLE ONCE A WEEK    . VENTOLIN HFA 108 (90 BASE) MCG/ACT inhaler USE 2 PUFFS 4 TIMES A DAY AS NEEDED  0   No current facility-administered medications for this visit.     Patient confirms/reports the following allergies:  No Known Allergies  No orders of the defined types were placed in this encounter.   AUTHORIZATION INFORMATION Primary Insurance: 1D#: Group #:  Secondary Insurance: 1D#: Group #:  SCHEDULE INFORMATION: Date: 03/01/19 Time: Location:[facility]

## 2019-02-05 ENCOUNTER — Telehealth: Payer: Self-pay | Admitting: *Deleted

## 2019-02-05 DIAGNOSIS — B351 Tinea unguium: Secondary | ICD-10-CM

## 2019-02-05 DIAGNOSIS — Z01812 Encounter for preprocedural laboratory examination: Secondary | ICD-10-CM

## 2019-02-05 MED ORDER — NONFORMULARY OR COMPOUNDED ITEM: 5 refills | Status: DC

## 2019-02-05 NOTE — Telephone Encounter (Signed)
I informed pt of Dr. Leeanne Rio review of results and orders. Pt states he would like to do oral lamisil and the topical antifungal. I explained that he would have to have blood work prior to beginning the lamisil and I would send that to [place] [place], and the topical would go to [place] [phone], they would call with the coverage and delivery information. Pt states understanding. Faxed [facility] antifungal orders.

## 2019-02-05 NOTE — Telephone Encounter (Signed)
-----   Message from Landis Martins, [place] sent at 02/04/2019 12:51 PM EDT ----- Can you let patient know that his culture is + for fungus and see what he decided he wants to do PO lamisil, Laser, and or Topical antifungal Thanks Dr Chauncey Cruel

## 2019-02-05 NOTE — Telephone Encounter (Signed)
I called pt to see if he needed to see Dr. Cannon Kettle for another reason than the fungal culture results. Pt states he was just to see Dr. Cannon Kettle for the fungal culture results and the appt could be cancelled.

## 2019-02-12 ENCOUNTER — Ambulatory Visit: Payer: 59 | Admitting: Sports Medicine

## 2019-02-26 ENCOUNTER — Other Ambulatory Visit
Admission: RE | Admit: 2019-02-26 | Discharge: 2019-02-26 | Disposition: A | Discharge status: Home / Self Care | Payer: 59 | Source: Ambulatory Visit | Attending: Gastroenterology | Admitting: Gastroenterology

## 2019-02-26 ENCOUNTER — Other Ambulatory Visit: Payer: Self-pay

## 2019-02-26 DIAGNOSIS — Z1159 Encounter for screening for other viral diseases: Secondary | ICD-10-CM | POA: Diagnosis not present

## 2019-02-26 LAB — SARS CORONAVIRUS 2 (TAT 6-24 HRS): SARS Coronavirus 2: NEGATIVE

## 2019-03-01 ENCOUNTER — Encounter
Admission: RE | Disposition: A | Discharge status: Home / Self Care | Payer: Self-pay | Source: Home / Self Care | Attending: Gastroenterology

## 2019-03-01 ENCOUNTER — Ambulatory Visit
Admission: RE | Admit: 2019-03-01 | Discharge: 2019-03-01 | Disposition: A | Discharge status: Home / Self Care | Payer: 59 | Attending: Gastroenterology | Admitting: Gastroenterology

## 2019-03-01 ENCOUNTER — Encounter: Payer: Self-pay | Admitting: Emergency Medicine

## 2019-03-01 ENCOUNTER — Ambulatory Visit: Payer: 59 | Admitting: Anesthesiology

## 2019-03-01 ENCOUNTER — Other Ambulatory Visit: Payer: Self-pay

## 2019-03-01 DIAGNOSIS — Z79899 Other long term (current) drug therapy: Secondary | ICD-10-CM | POA: Insufficient documentation

## 2019-03-01 DIAGNOSIS — Z833 Family history of diabetes mellitus: Secondary | ICD-10-CM | POA: Diagnosis not present

## 2019-03-01 DIAGNOSIS — Z803 Family history of malignant neoplasm of breast: Secondary | ICD-10-CM | POA: Insufficient documentation

## 2019-03-01 DIAGNOSIS — Z8249 Family history of ischemic heart disease and other diseases of the circulatory system: Secondary | ICD-10-CM | POA: Insufficient documentation

## 2019-03-01 DIAGNOSIS — Z1211 Encounter for screening for malignant neoplasm of colon: Secondary | ICD-10-CM | POA: Diagnosis present

## 2019-03-01 DIAGNOSIS — K644 Residual hemorrhoidal skin tags: Secondary | ICD-10-CM | POA: Insufficient documentation

## 2019-03-01 DIAGNOSIS — Z7951 Long term (current) use of inhaled steroids: Secondary | ICD-10-CM | POA: Insufficient documentation

## 2019-03-01 DIAGNOSIS — Z8042 Family history of malignant neoplasm of prostate: Secondary | ICD-10-CM | POA: Diagnosis not present

## 2019-03-01 DIAGNOSIS — R51 Headache: Secondary | ICD-10-CM | POA: Diagnosis not present

## 2019-03-01 DIAGNOSIS — J45909 Unspecified asthma, uncomplicated: Secondary | ICD-10-CM | POA: Diagnosis not present

## 2019-03-01 DIAGNOSIS — Z800 Family history of malignant neoplasm of digestive organs: Secondary | ICD-10-CM

## 2019-03-01 DIAGNOSIS — Z791 Long term (current) use of non-steroidal anti-inflammatories (NSAID): Secondary | ICD-10-CM | POA: Diagnosis not present

## 2019-03-01 DIAGNOSIS — K219 Gastro-esophageal reflux disease without esophagitis: Secondary | ICD-10-CM | POA: Diagnosis not present

## 2019-03-01 DIAGNOSIS — Z809 Family history of malignant neoplasm, unspecified: Secondary | ICD-10-CM | POA: Diagnosis not present

## 2019-03-01 DIAGNOSIS — E7800 Pure hypercholesterolemia, unspecified: Secondary | ICD-10-CM | POA: Insufficient documentation

## 2019-03-01 DIAGNOSIS — Z808 Family history of malignant neoplasm of other organs or systems: Secondary | ICD-10-CM | POA: Insufficient documentation

## 2019-03-01 DIAGNOSIS — G4730 Sleep apnea, unspecified: Secondary | ICD-10-CM | POA: Diagnosis not present

## 2019-03-01 DIAGNOSIS — Z1212 Encounter for screening for malignant neoplasm of rectum: Secondary | ICD-10-CM

## 2019-03-01 HISTORY — PX: COLONOSCOPY WITH PROPOFOL: SHX5780

## 2019-03-01 SURGERY — COLONOSCOPY WITH PROPOFOL
Anesthesia: General

## 2019-03-01 MED ORDER — MIDAZOLAM HCL 2 MG/2ML IJ SOLN
INTRAMUSCULAR | Status: AC
  Filled 2019-03-01: qty 2

## 2019-03-01 MED ORDER — PROPOFOL 500 MG/50ML IV EMUL
INTRAVENOUS | Status: DC | PRN
  Administered 2019-03-01: 75 ug/kg/min via INTRAVENOUS

## 2019-03-01 MED ORDER — FENTANYL CITRATE (PF) 100 MCG/2ML IJ SOLN
INTRAMUSCULAR | Status: DC | PRN
  Administered 2019-03-01 (×2): 50 ug via INTRAVENOUS

## 2019-03-01 MED ORDER — SODIUM CHLORIDE 0.9 % IV SOLN
INTRAVENOUS | Status: DC
  Administered 2019-03-01: 1000 mL via INTRAVENOUS

## 2019-03-01 MED ORDER — LIDOCAINE HCL (PF) 2 % IJ SOLN
INTRAMUSCULAR | Status: DC | PRN
  Administered 2019-03-01: 100 mg

## 2019-03-01 MED ORDER — LIDOCAINE HCL (PF) 2 % IJ SOLN
INTRAMUSCULAR | Status: AC
  Filled 2019-03-01: qty 10

## 2019-03-01 MED ORDER — PROPOFOL 10 MG/ML IV BOLUS
INTRAVENOUS | Status: DC | PRN
  Administered 2019-03-01: 30 mg via INTRAVENOUS
  Administered 2019-03-01: 20 mg via INTRAVENOUS

## 2019-03-01 MED ORDER — MIDAZOLAM HCL 5 MG/5ML IJ SOLN
INTRAMUSCULAR | Status: DC | PRN
  Administered 2019-03-01: 2 mg via INTRAVENOUS

## 2019-03-01 MED ORDER — FENTANYL CITRATE (PF) 100 MCG/2ML IJ SOLN
INTRAMUSCULAR | Status: AC
  Filled 2019-03-01: qty 2

## 2019-03-01 MED ORDER — PROPOFOL 500 MG/50ML IV EMUL
INTRAVENOUS | Status: AC
  Filled 2019-03-01: qty 50

## 2019-03-01 NOTE — Anesthesia Post-op Follow-up Note (Signed)
Anesthesia QCDR form completed.        

## 2019-03-01 NOTE — Transfer of Care (Signed)
Immediate Anesthesia Transfer of Care Note  Patient: Christopher Burns  Procedure(s) Performed: COLONOSCOPY WITH PROPOFOL (N/A )  Patient Location: PACU  Anesthesia Type:General  Level of Consciousness: sedated  Airway & Oxygen Therapy: Patient Spontanous Breathing and Patient connected to nasal cannula oxygen  Post-op Assessment: Report given to RN and Post -op Vital signs reviewed and stable  Post vital signs: Reviewed and stable  Last Vitals:  Vitals Value Taken Time  BP 106/86 03/01/19 0919  Temp 36.2 C 03/01/19 0919  Pulse 69 03/01/19 0922  Resp 14 03/01/19 0922  SpO2 96 % 03/01/19 0922  Vitals shown include unvalidated device data.  Last Pain:  Vitals:   03/01/19 0919  TempSrc: Tympanic  PainSc: 0-No pain         Complications: No apparent anesthesia complications

## 2019-03-01 NOTE — Op Note (Signed)
[facility] Patient Name: Christopher Burns Procedure Date: 03/01/2019 8:48 AM MRN: [id number] Account #: [id number] Date of Birth: 02-Nov-1970 Admit Type: Outpatient Age: 48 Room: [facility] ENDO ROOM 3 Gender: Male Note Status: Finalized Procedure:            Colonoscopy Indications:          Screening for colorectal malignant neoplasm Providers:            Jonathon Bellows MD, MD Medicines:            Monitored Anesthesia Care Complications:        No immediate complications. Procedure:            Pre-Anesthesia Assessment:                       - Prior to the procedure, a History and Physical was                        performed, and patient medications, allergies and                        sensitivities were reviewed. The patient's tolerance of                        previous anesthesia was reviewed.                       - The risks and benefits of the procedure and the                        sedation options and risks were discussed with the                        patient. All questions were answered and informed                        consent was obtained.                       - ASA Grade Assessment: II - A patient with mild                        systemic disease.                       After obtaining informed consent, the colonoscope was                        passed under direct vision. Throughout the procedure,                        the patient's blood pressure, pulse, and oxygen                        saturations were monitored continuously. The                        Colonoscope was introduced through the anus and                        advanced to the the cecum, identified by the  appendiceal orifice, IC valve and transillumination.                        The colonoscopy was performed with ease. The patient                        tolerated the procedure well. The quality of the bowel                        preparation was  excellent. Findings:      The perianal and digital rectal examinations were normal.      Non-bleeding external hemorrhoids were found. The hemorrhoids were small.      The exam was otherwise without abnormality on direct and retroflexion       views. Impression:           - Non-bleeding external hemorrhoids.                       - The examination was otherwise normal on direct and                        retroflexion views.                       - No specimens collected. Recommendation:       - Discharge patient to home (with escort).                       - Resume previous diet.                       - Continue present medications.                       - Repeat colonoscopy in 10 years for screening purposes. Procedure Code(s):    --- Professional ---                       [id number], Colonoscopy, flexible; diagnostic, including                        collection of specimen(s) by brushing or washing, when                        performed (separate procedure) Diagnosis Code(s):    --- Professional ---                       Z12.11, Encounter for screening for malignant neoplasm                        of colon                       K64.4, Residual hemorrhoidal skin tags CPT copyright 2019 American Medical Association. All rights reserved. The codes documented in this report are preliminary and upon coder review may  be revised to meet current compliance requirements. Wyline MoodKiran Eleah Lahaie, MD Wyline MoodKiran Giah Fickett MD, MD 03/01/2019 9:17:23 AM This report has been signed electronically. Number of Addenda: 0 Note Initiated On: 03/01/2019 8:48 AM Scope Withdrawal Time: 0 hours 10 minutes 59 seconds  Total Procedure Duration: 0 hours 13 minutes 22 seconds  Estimated Blood Loss:  Estimated blood loss: none.      [facility]

## 2019-03-01 NOTE — H&P (Signed)
Christopher MoodKiran Latunya Kissick, MD [address], [address], [place], [place], [address] [address], [address], [place], [place], [address] Phone: [phone]  Fax: [phone]  Primary Care Physician:  Corky DownsMasoud, Javed, MD   Pre-Procedure History & Physical: HPI:  Christopher Burns is a 48 y.o. male is here for an colonoscopy.   Past Medical History:  Diagnosis Date  . Asthma   . Complication of anesthesia    HICCUPS FOR 1 WEEK AFTER BICEP SURGERY  . Dizziness   . GERD (gastroesophageal reflux disease)   . HA (headache)   . Hemorrhoid   . High cholesterol   . Hypercholesteremia   . Sleep apnea    NO CPAP    Past Surgical History:  Procedure Laterality Date  . bisep Left 2007  . KNEE SURGERY Right   . LASIK    . TONSILLECTOMY    . UMBILICAL HERNIA REPAIR N/A 07/29/2015   Procedure: HERNIA REPAIR UMBILICAL ADULT;  Surgeon: Kieth BrightlySeeplaputhur G Sankar, MD;  Location: [facility] ORS;  Service: General;  Laterality: N/A;  . VASOTOMY      Prior to Admission medications   Medication Sig Start Date End Date Taking? Authorizing Provider  Cholecalciferol (VITAMIN D) 2000 UNITS tablet Take 2,000 Units by mouth daily.   Yes [provider]  esomeprazole (NEXIUM) 40 MG capsule Take 40 mg by mouth at bedtime.    Yes [provider]  Fluticasone Furoate-Vilanterol (BREO ELLIPTA) 200-25 MCG/INH AEPB Inhale 1 puff into the lungs every morning.    Yes [provider]  hydrocortisone 2.5 % cream APPLY TO AFFECTED AREA TWICE A DAY 01/10/19  Yes [provider]  ibuprofen (ADVIL,MOTRIN) 200 MG tablet Take 600 mg by mouth every 6 (six) hours as needed for headache or mild pain.   Yes [provider]  L-ARGININE PO Take 1 tablet by mouth daily.    Yes [provider]  lisinopril (PRINIVIL,ZESTRIL) 10 MG tablet Take 10 mg daily by mouth. 05/30/17  Yes [provider]  montelukast (SINGULAIR) 10 MG tablet Take 10 mg by mouth at bedtime.  06/12/14  Yes  [provider]  Multiple Vitamins-Minerals (MULTIVITAMIN WITH MINERALS) tablet Take 1 tablet by mouth daily.   Yes [provider]  NONFORMULARY OR COMPOUNDED ITEM [facility]:  Antifungal topical - Terbinafine 3%, Fluconazole 2%, Tea Tree Oil 5%, Urea 10%, Ibuprofen 2%, in DMSO suspension #6530ml. Apply to affected toenail(s) once daily (at bedtime) or twice daily. 02/05/19  Yes Stover, Titorya, DPM  omega-3 acid ethyl esters (LOVAZA) 1 G capsule Take 1 g by mouth daily.   Yes [provider]  simvastatin (ZOCOR) 10 MG tablet Take 10 mg daily by mouth. 06/25/17  Yes [provider]  simvastatin (ZOCOR) 40 MG tablet Take 40 mg by mouth at bedtime.  06/28/14  Yes [provider]  testosterone cypionate (DEPOTESTOSTERONE CYPIONATE) 200 MG/ML injection INJECT 1 ML INTO MUSCLE ONCE A WEEK 11/28/18  Yes [provider]  VENTOLIN HFA 108 (90 BASE) MCG/ACT inhaler USE 2 PUFFS 4 TIMES A DAY AS NEEDED 05/07/15  Yes [provider]  diclofenac (VOLTAREN) 75 MG EC tablet TAKE 1 TABLET (75 MG TOTAL) BY MOUTH 2 (TWO) TIMES DAILY. Patient not taking: Reported on 03/01/2019 11/17/15   Lenn Sinkegal, Norman S, DPM  EPIPEN 2-PAK 0.3 MG/0.3ML SOAJ injection See admin instructions. 01/08/15   [provider]    Allergies as of 02/01/2019  . (No Known Allergies)    Family History  Problem Relation Age of Onset  . Cancer Mother        breast  . Brain cancer Mother   . High Cholesterol Mother   . Diabetes Father   . High blood pressure Father   . High Cholesterol Father   . Prostate cancer Paternal Grandfather   . Cancer Maternal Grandmother   . Cancer Maternal Grandfather     Social History   Socioeconomic History  . Marital status: Married    Spouse name: [name]  . Number of children: 1  . Years of education: 8  . Highest education level: Not on file  Occupational History    Comment: [employer]  . Financial  resource strain: Not on file  . Food insecurity    Worry: Not on file    Inability: Not on file  . Transportation needs    Medical: Not on file    Non-medical: Not on file  Tobacco Use  . Smoking status: Never Smoker  . Smokeless tobacco: Never Used  Substance and Sexual Activity  . Alcohol use: Yes    Alcohol/week: 2.0 standard drinks    Types: 2 Shots of liquor per week    Comment: O[facility]. Kooler  . Drug use: No  . Sexual activity: Yes  Lifestyle  . Physical activity    Days per week: Not on file    Minutes per session: Not on file  . Stress: Not on file  Relationships  . Social Herbalist on phone: Not on file    Gets together: Not on file    Attends religious service: Not on file    Active member of club or organization: Not on file    Attends meetings of clubs or organizations: Not on file    Relationship status: Not on file  . Intimate partner violence    Fear of current or ex partner: Not on file    Emotionally abused: Not on file    Physically abused: Not on file    Forced sexual activity: Not on file  Other Topics Concern  . Not on file  Social History Narrative   Patient lives at home  with his wife [name]).  Patient works full time for [employer].   Education some college.   Right handed.   Caffeine one shot of colombian  coffee.    Review of Systems: See HPI, otherwise negative ROS  Physical Exam: BP (!) 143/88   Pulse 79   Temp 98.8 F (37.1 C) (Oral)   Resp 16   Ht 5\' 11"  (1.803 m)   Wt 127.9 kg   SpO2 99%   BMI 39.33 kg/m  General:   Alert,  pleasant and cooperative in NAD Head:  Normocephalic and atraumatic. Neck:  Supple; no masses or thyromegaly. Lungs:  Clear throughout to auscultation, normal respiratory effort.    Heart:  +S1, +S2, Regular rate and rhythm, No edema. Abdomen:  Soft, nontender and nondistended. Normal bowel sounds, without guarding, and without rebound.   Neurologic:  Alert and  oriented x4;   grossly normal neurologically.  Impression/Plan: Christopher Burns is here for an colonoscopy to be performed for Screening colonoscopy average risk   Risks, benefits, limitations, and alternatives regarding  colonoscopy have been reviewed with the patient.  Questions have been answered.  All parties agreeable.   Jonathon Bellows, MD  03/01/2019, 8:49 AM

## 2019-03-01 NOTE — Anesthesia Preprocedure Evaluation (Signed)
Anesthesia Evaluation  Patient identified by MRN, date of birth, ID band Patient awake    Reviewed: Allergy & Precautions, H&P , NPO status , Patient's Chart, lab work & pertinent test results  History of Anesthesia Complications (+) history of anesthetic complications  Airway Mallampati: III  TM Distance: >3 FB Neck ROM: full    Dental  (+) Chipped   Pulmonary asthma , sleep apnea and Continuous Positive Airway Pressure Ventilation ,           Cardiovascular negative cardio ROS       Neuro/Psych  Headaches, negative psych ROS   GI/Hepatic Neg liver ROS, GERD  Medicated and Controlled,  Endo/Other  negative endocrine ROS  Renal/GU negative Renal ROS  negative genitourinary   Musculoskeletal   Abdominal   Peds  Hematology negative hematology ROS (+)   Anesthesia Other Findings Past Medical History: No date: Asthma No date: Complication of anesthesia     Comment:  HICCUPS FOR 1 WEEK AFTER BICEP SURGERY No date: Dizziness No date: GERD (gastroesophageal reflux disease) No date: HA (headache) No date: Hemorrhoid No date: High cholesterol No date: Hypercholesteremia No date: Sleep apnea     Comment:  NO CPAP  Past Surgical History: 2007: bisep; Left No date: KNEE SURGERY; Right No date: LASIK No date: TONSILLECTOMY 71/01/2693: UMBILICAL HERNIA REPAIR; N/A     Comment:  Procedure: HERNIA REPAIR UMBILICAL ADULT;  Surgeon:               Christene Lye, MD;  Location: [facility] ORS;  Service:              General;  Laterality: N/A; No date: VASOTOMY  BMI    Body Mass Index: 39.33 kg/m      Reproductive/Obstetrics negative OB ROS                             Anesthesia Physical Anesthesia Plan  ASA: III  Anesthesia Plan: General   Post-op Pain Management:    Induction: Intravenous  PONV Risk Score and Plan: Propofol infusion and TIVA  Airway Management Planned:  Natural Airway and Nasal Cannula  Additional Equipment:   Intra-op Plan:   Post-operative Plan:   Informed Consent: I have reviewed the patients History and Physical, chart, labs and discussed the procedure including the risks, benefits and alternatives for the proposed anesthesia with the patient or authorized representative who has indicated his/her understanding and acceptance.     Dental Advisory Given  Plan Discussed with: Anesthesiologist, CRNA and Surgeon  Anesthesia Plan Comments: (Patient consented for risks of anesthesia including but not limited to:  - adverse reactions to medications - risk of intubation if required - damage to teeth, lips or other oral mucosa - sore throat or hoarseness - Damage to heart, brain, lungs or loss of life  Patient voiced understanding.)        Anesthesia Quick Evaluation

## 2019-03-01 NOTE — Anesthesia Postprocedure Evaluation (Signed)
Anesthesia Post Note  Patient: Gerrell Tabet  Procedure(s) Performed: COLONOSCOPY WITH PROPOFOL (N/A )  Patient location during evaluation: Endoscopy Anesthesia Type: General Level of consciousness: awake and alert Pain management: pain level controlled Vital Signs Assessment: post-procedure vital signs reviewed and stable Respiratory status: spontaneous breathing, nonlabored ventilation, respiratory function stable and patient connected to nasal cannula oxygen Cardiovascular status: blood pressure returned to baseline and stable Postop Assessment: no apparent nausea or vomiting Anesthetic complications: no     Last Vitals:  Vitals:   03/01/19 0929 03/01/19 0939  BP: 108/76 122/66  Pulse: 64 76  Resp: 14 17  Temp:    SpO2: 95% 100%    Last Pain:  Vitals:   03/01/19 0939  TempSrc:   PainSc: 0-No pain                 Precious Haws Jamas Jaquay

## 2019-03-04 ENCOUNTER — Encounter: Payer: Self-pay | Admitting: Gastroenterology

## 2019-04-19 ENCOUNTER — Ambulatory Visit: Payer: 59 | Admitting: Podiatry

## 2019-07-26 ENCOUNTER — Other Ambulatory Visit: Payer: Self-pay

## 2019-07-29 ENCOUNTER — Inpatient Hospital Stay: Payer: 59 | Attending: Oncology | Admitting: Oncology

## 2019-07-29 ENCOUNTER — Encounter: Payer: Self-pay | Admitting: Oncology

## 2019-07-29 ENCOUNTER — Other Ambulatory Visit: Payer: Self-pay

## 2019-07-29 ENCOUNTER — Inpatient Hospital Stay: Payer: 59

## 2019-07-29 VITALS — BP 130/80 | HR 80 | Temp 95.9°F | Ht 71.0 in | Wt 292.0 lb

## 2019-07-29 DIAGNOSIS — Z809 Family history of malignant neoplasm, unspecified: Secondary | ICD-10-CM | POA: Diagnosis not present

## 2019-07-29 DIAGNOSIS — Z833 Family history of diabetes mellitus: Secondary | ICD-10-CM | POA: Insufficient documentation

## 2019-07-29 DIAGNOSIS — D751 Secondary polycythemia: Secondary | ICD-10-CM | POA: Insufficient documentation

## 2019-07-29 DIAGNOSIS — Z808 Family history of malignant neoplasm of other organs or systems: Secondary | ICD-10-CM | POA: Diagnosis not present

## 2019-07-29 DIAGNOSIS — R5383 Other fatigue: Secondary | ICD-10-CM | POA: Insufficient documentation

## 2019-07-29 DIAGNOSIS — E291 Testicular hypofunction: Secondary | ICD-10-CM | POA: Diagnosis not present

## 2019-07-29 DIAGNOSIS — Z8719 Personal history of other diseases of the digestive system: Secondary | ICD-10-CM | POA: Insufficient documentation

## 2019-07-29 DIAGNOSIS — Z8042 Family history of malignant neoplasm of prostate: Secondary | ICD-10-CM | POA: Insufficient documentation

## 2019-07-29 DIAGNOSIS — E7800 Pure hypercholesterolemia, unspecified: Secondary | ICD-10-CM | POA: Insufficient documentation

## 2019-07-29 DIAGNOSIS — Z803 Family history of malignant neoplasm of breast: Secondary | ICD-10-CM | POA: Diagnosis not present

## 2019-07-29 DIAGNOSIS — Z8249 Family history of ischemic heart disease and other diseases of the circulatory system: Secondary | ICD-10-CM | POA: Insufficient documentation

## 2019-07-29 DIAGNOSIS — Z79899 Other long term (current) drug therapy: Secondary | ICD-10-CM | POA: Insufficient documentation

## 2019-07-29 DIAGNOSIS — G4733 Obstructive sleep apnea (adult) (pediatric): Secondary | ICD-10-CM | POA: Diagnosis not present

## 2019-07-29 LAB — URINALYSIS, COMPLETE (UACMP) WITH MICROSCOPIC
Bacteria, UA: NONE SEEN
Bilirubin Urine: NEGATIVE
Glucose, UA: NEGATIVE mg/dL
Hgb urine dipstick: NEGATIVE
Ketones, ur: NEGATIVE mg/dL
Leukocytes,Ua: NEGATIVE
Nitrite: NEGATIVE
Protein, ur: NEGATIVE mg/dL
Specific Gravity, Urine: 1.016 (ref 1.005–1.030)
Squamous Epithelial / HPF: NONE SEEN (ref 0–5)
pH: 6 (ref 5.0–8.0)

## 2019-07-29 LAB — COMPREHENSIVE METABOLIC PANEL
ALT: 41 U/L (ref 0–44)
AST: 33 U/L (ref 15–41)
Albumin: 4.3 g/dL (ref 3.5–5.0)
Alkaline Phosphatase: 37 U/L — ABNORMAL LOW (ref 38–126)
Anion gap: 9 (ref 5–15)
BUN: 19 mg/dL (ref 6–20)
CO2: 29 mmol/L (ref 22–32)
Calcium: 9.5 mg/dL (ref 8.9–10.3)
Chloride: 100 mmol/L (ref 98–111)
Creatinine, Ser: 1.4 mg/dL — ABNORMAL HIGH (ref 0.61–1.24)
GFR calc Af Amer: >60 mL/min (ref >60)
GFR calc non Af Amer: 59 mL/min — ABNORMAL LOW (ref >60)
Glucose, Bld: 105 mg/dL — ABNORMAL HIGH (ref 70–99)
Potassium: 4.3 mmol/L (ref 3.5–5.1)
Sodium: 138 mmol/L (ref 135–145)
Total Bilirubin: 0.6 mg/dL (ref 0.3–1.2)
Total Protein: 7.5 g/dL (ref 6.5–8.1)

## 2019-07-29 LAB — CBC WITH DIFFERENTIAL/PLATELET
Abs Immature Granulocytes: 0.02 10*3/uL (ref 0.00–0.07)
Basophils Absolute: 0 10*3/uL (ref 0.0–0.1)
Basophils Relative: 1 %
Eosinophils Absolute: 0.2 10*3/uL (ref 0.0–0.5)
Eosinophils Relative: 2 %
HCT: 58.7 % — ABNORMAL HIGH (ref 39.0–52.0)
Hemoglobin: 18.8 g/dL — ABNORMAL HIGH (ref 13.0–17.0)
Immature Granulocytes: 0 %
Lymphocytes Relative: 24 %
Lymphs Abs: 1.9 10*3/uL (ref 0.7–4.0)
MCH: 26.6 pg (ref 26.0–34.0)
MCHC: 32 g/dL (ref 30.0–36.0)
MCV: 83 fL (ref 80.0–100.0)
Monocytes Absolute: 0.9 10*3/uL (ref 0.1–1.0)
Monocytes Relative: 11 %
Neutro Abs: 4.9 10*3/uL (ref 1.7–7.7)
Neutrophils Relative %: 62 %
Platelets: 256 10*3/uL (ref 150–400)
RBC: 7.07 MIL/uL — ABNORMAL HIGH (ref 4.22–5.81)
RDW: 12.8 % (ref 11.5–15.5)
WBC: 7.9 10*3/uL (ref 4.0–10.5)
nRBC: 0 % (ref 0.0–0.2)

## 2019-07-29 NOTE — Progress Notes (Signed)
Hematology/Oncology Consult note [facility] Telephone:([phone] [phone]  Patient Care Team: Christopher Downs, MD as PCP - General (Internal Medicine) Christopher Downs, MD (Internal Medicine) Christopher Brightly, MD (General Surgery)   Name of the patient: Christopher Burns  [id number]  17-Feb-1971    Reason for referral-polycythemia   Referring physician-Dr. Juel Burns  Date of visit: 07/29/19   History of presenting illness-patient is a 48 year old male with a past medical history significant for hypercholesterolemia, obstructive sleep apnea, hypogonad is him and is on testosterone replacement therapy since about a year.  He has been referred to [place] for polycythemia.  I do not have his recent CBC in our system but patient states that his hemoglobin has been close to 18.  Testosterone was mainly started for fatigue.  He remains compliant with CPAP and uses it every night.  He has been a lifetime non-smoker.  No prior history of any DVT PE, heart attacks or strokes.  Patient denies any unintentional weight loss.  He is working on intentional weight loss.  ECOG PS- 0  Pain scale- 0   Review of systems- Review of Systems  Constitutional: Negative for chills, fever, malaise/fatigue and weight loss.  HENT: Negative for congestion, ear discharge and nosebleeds.   Eyes: Negative for blurred vision.  Respiratory: Negative for cough, hemoptysis, sputum production, shortness of breath and wheezing.   Cardiovascular: Negative for chest pain, palpitations, orthopnea and claudication.  Gastrointestinal: Negative for abdominal pain, blood in stool, constipation, diarrhea, heartburn, melena, nausea and vomiting.  Genitourinary: Negative for dysuria, flank pain, frequency, hematuria and urgency.  Musculoskeletal: Negative for back pain, joint pain and myalgias.  Skin: Negative for rash.  Neurological: Negative for dizziness, tingling, focal weakness, seizures,  weakness and headaches.  Endo/Heme/Allergies: Does not bruise/bleed easily.  Psychiatric/Behavioral: Negative for depression and suicidal ideas. The patient does not have insomnia.     No Known Allergies  Patient Active Problem List   Diagnosis Date Noted  . High cholesterol   . Dizziness      Past Medical History:  Diagnosis Date  . Asthma   . Complication of anesthesia    HICCUPS FOR 1 WEEK AFTER BICEP SURGERY  . Dizziness   . GERD (gastroesophageal reflux disease)   . HA (headache)   . Hemorrhoid   . High cholesterol   . Hypercholesteremia   . Sleep apnea    NO CPAP     Past Surgical History:  Procedure Laterality Date  . bisep Left 2007  . COLONOSCOPY WITH PROPOFOL N/A 03/01/2019   Procedure: COLONOSCOPY WITH PROPOFOL;  Surgeon: Christopher Mood, MD;  Location[facility] ENDOSCOPY;  Service: Gastroenterology;  Laterality: N/A;  . KNEE SURGERY Right   . LASIK    . TONSILLECTOMY    . UMBILICAL HERNIA REPAIR N/A 07/29/2015   Procedure: HERNIA REPAIR UMBILICAL ADULT;  Surgeon: Christopher Brightly, MD;  Location: [facility] ORS;  Service: General;  Laterality: N/A;  . VASOTOMY      Social History   Socioeconomic History  . Marital status: Married    Spouse name: Christopher Burns  . Number of children: 1  . Years of education: 8  . Highest education level: Not on file  Occupational History    Comment: [employer]  Tobacco Use  . Smoking status: Never Smoker  . Smokeless tobacco: Never Used  Substance and Sexual Activity  . Alcohol use: Not Currently  . Drug use: No  . Sexual activity: Yes  Other Topics Concern  .  Not on file  Social History Narrative   Patient lives at home  with his wife Christopher Burns).  Patient works full time for [employer].   Education some college.   Right handed.   Caffeine one shot of colombian  coffee.   Social Determinants of Health   Financial Resource Strain:   . Difficulty of Paying Living Expenses: Not on file  Food Insecurity:     . Worried About Christopher Burns in the Last Year: Not on file  . Ran Out of Food in the Last Year: Not on file  Transportation Needs:   . Lack of Transportation (Medical): Not on file  . Lack of Transportation (Non-Medical): Not on file  Physical Activity:   . Days of Exercise per Week: Not on file  . Minutes of Exercise per Session: Not on file  Stress:   . Feeling of Stress : Not on file  Social Connections:   . Frequency of Communication with Friends and Family: Not on file  . Frequency of Social Gatherings with Friends and Family: Not on file  . Attends Religious Services: Not on file  . Active Member of Clubs or Organizations: Not on file  . Attends Banker Meetings: Not on file  . Marital Status: Not on file  Intimate Partner Violence:   . Fear of Current or Ex-Partner: Not on file  . Emotionally Abused: Not on file  . Physically Abused: Not on file  . Sexually Abused: Not on file     Family History  Problem Relation Age of Onset  . Cancer Mother        breast  . Brain cancer Mother   . High Cholesterol Mother   . Diabetes Father   . High blood pressure Father   . High Cholesterol Father   . Prostate cancer Paternal Grandfather   . Cancer Maternal Grandmother   . Cancer Maternal Grandfather      Current Outpatient Medications:  .  Cholecalciferol (VITAMIN D) 2000 UNITS tablet, Take 2,000 Units by mouth daily., Disp: , Rfl:  .  EPIPEN 2-PAK 0.3 MG/0.3ML SOAJ injection, See admin instructions., Disp: , Rfl: 1 .  esomeprazole (NEXIUM) 40 MG capsule, Take 40 mg by mouth at bedtime. , Disp: , Rfl:  .  Fluticasone Furoate-Vilanterol (BREO ELLIPTA) 200-25 MCG/INH AEPB, Inhale 1 puff into the lungs every morning. , Disp: , Rfl:  .  hydrocortisone 2.5 % cream, APPLY TO AFFECTED AREA TWICE A DAY, Disp: , Rfl:  .  ibuprofen (ADVIL,MOTRIN) 200 MG tablet, Take 600 mg by mouth every 6 (six) hours as needed for headache or mild pain., Disp: , Rfl:  .   L-ARGININE PO, Take 1 tablet by mouth daily. , Disp: , Rfl:  .  lisinopril (PRINIVIL,ZESTRIL) 10 MG tablet, Take 10 mg daily by mouth., Disp: , Rfl: 4 .  montelukast (SINGULAIR) 10 MG tablet, Take 10 mg by mouth at bedtime. , Disp: , Rfl: 11 .  Multiple Vitamins-Minerals (MULTIVITAMIN WITH MINERALS) tablet, Take 1 tablet by mouth daily., Disp: , Rfl:  .  NONFORMULARY OR COMPOUNDED ITEM, [facility]:  Antifungal topical - Terbinafine 3%, Fluconazole 2%, Tea Tree Oil 5%, Urea 10%, Ibuprofen 2%, in DMSO suspension #17ml. Apply to affected toenail(s) once daily (at bedtime) or twice daily., Disp: 30 each, Rfl: 5 .  omega-3 acid ethyl esters (LOVAZA) 1 G capsule, Take 1 g by mouth daily., Disp: , Rfl:  .  simvastatin (ZOCOR) 40 MG  tablet, Take 40 mg by mouth at bedtime. , Disp: , Rfl:  .  testosterone cypionate (DEPOTESTOSTERONE CYPIONATE) 200 MG/ML injection, INJECT 1 ML INTO MUSCLE ONCE A WEEK, Disp: , Rfl:  .  VENTOLIN HFA 108 (90 BASE) MCG/ACT inhaler, USE 2 PUFFS 4 TIMES A DAY AS NEEDED, Disp: , Rfl: 0   Physical exam:  Vitals:   07/29/19 0955  BP: 130/80  Pulse: 80  Temp: (!) 95.9 F (35.5 C)  TempSrc: Tympanic  Weight: 292 lb (132.5 kg)  Height: 5\' 11"  (1.803 m)   Physical Exam Constitutional:      General: He is not in acute distress. HENT:     Head: Normocephalic and atraumatic.  Eyes:     Pupils: Pupils are equal, round, and reactive to light.  Cardiovascular:     Rate and Rhythm: Normal rate and regular rhythm.     Heart sounds: Normal heart sounds.  Pulmonary:     Effort: Pulmonary effort is normal.     Breath sounds: Normal breath sounds.  Abdominal:     General: Bowel sounds are normal.     Palpations: Abdomen is soft.     Comments: No palpable splenomegaly  Musculoskeletal:     Cervical back: Normal range of motion.  Skin:    General: Skin is warm and dry.  Neurological:     Mental Status: He is alert and oriented to person, place, and time.         CMP Latest Ref Rng & Units 07/29/2019  Glucose 70 - 99 mg/dL 161(W105(H)  BUN 6 - 20 mg/dL 19  Creatinine 9.600.61 - 4.541.24 mg/dL 0.98(J1.40(H)  Sodium 191135 - 478145 mmol/L 138  Potassium 3.5 - 5.1 mmol/L 4.3  Chloride 98 - 111 mmol/L 100  CO2 22 - 32 mmol/L 29  Calcium 8.9 - 10.3 mg/dL 9.5  Total Protein 6.5 - 8.1 g/dL 7.5  Total Bilirubin 0.3 - 1.2 mg/dL 0.6  Alkaline Phos 38 - 126 U/L 37(L)  AST 15 - 41 U/L 33  ALT 0 - 44 U/L 41   CBC Latest Ref Rng & Units 07/29/2019  WBC 4.0 - 10.5 K/uL 7.9  Hemoglobin 13.0 - 17.0 g/dL 18.8(H)  Hematocrit 39.0 - 52.0 % 58.7(H)  Platelets 150 - 400 K/uL 256     Assessment and plan- Patient is a [age] y.o. male referred for polycythemia likely secondary to testosterone replacement therapy  Discussed difference between polycythemia vera and secondary polycythemia.  Patient is a non-smoker and although he has obstructive sleep apnea which can cause polycythemia he remains compliant with CPAP and I therefore do not think that that would be the reason for his polycythemia.  Patient has been on weekly testosterone replacement therapy for hypogonadism over the last 1 year which I feel is the reason for his polycythemia.  Today I will check CBC with differential, CMP, JAK2 mutation testing, EPO levels and urinalysis  I recommend the patient should stop his testosterone supplements until his hematocrit is less than 50.  At that time he can restart his testosterone if it provides symptomatic relief but I would recommend that he should be put back on the lowest dose of testosterone required to provide symptomatic benefit in order to prevent worsening of his polycythemia which has been associated with increased risk of cardiovascular events and thromboembolic episodes.  I will proceed with phlebotomy 500 cc every 2 weeks with the goal to maintain hematocrit less than 50.  Also recommended that patient should keep  up with his oral hydration after phlebotomy To avoid symptoms  such as dizziness and low blood pressure.  Patient verbalized understanding.  He will continue getting phlebotomies every 2 weeks and I will see him back in 2 months for video visit   Thank you for this kind referral and the opportunity to participate in the care of this patient   Visit Diagnosis 1. Polycythemia     Dr. Randa Evens, MD, MPH [facility] at [facility] [phone] 07/29/2019 1:28 PM

## 2019-07-29 NOTE — Progress Notes (Signed)
Patient is here today to establish care polycythemia vera.

## 2019-07-30 ENCOUNTER — Other Ambulatory Visit: Payer: Self-pay

## 2019-07-30 ENCOUNTER — Telehealth: Payer: Self-pay | Admitting: *Deleted

## 2019-07-30 ENCOUNTER — Inpatient Hospital Stay: Payer: 59

## 2019-07-30 VITALS — BP 136/84 | HR 91 | Temp 97.0°F | Resp 18

## 2019-07-30 DIAGNOSIS — D751 Secondary polycythemia: Secondary | ICD-10-CM

## 2019-07-30 LAB — ERYTHROPOIETIN: Erythropoietin: 13.4 m[IU]/mL (ref 2.6–18.5)

## 2019-07-30 NOTE — Telephone Encounter (Signed)
Even seeing the whole entire thing it so long as well called patient today to let him know that his creatinine was 1.4.  I know he is coming today to get his phlebotomy and he really needs to be hydrated.  I have asked the patient that he drink at least 8 glasses of water a day not including any other liquids that he would like to do.  It is very important on the day of the phlebotomy that he be well-hydrated so hopefully that they would find a vein and everything will go well with his phlebotomy.  Agreeable to plan

## 2019-08-01 LAB — JAK2 GENOTYPR

## 2019-08-13 ENCOUNTER — Other Ambulatory Visit: Payer: Self-pay

## 2019-08-13 ENCOUNTER — Inpatient Hospital Stay: Payer: 59

## 2019-08-13 VITALS — BP 144/73 | HR 79 | Temp 97.0°F | Resp 18

## 2019-08-13 DIAGNOSIS — D751 Secondary polycythemia: Secondary | ICD-10-CM

## 2019-08-13 LAB — CBC
HCT: 53.6 % — ABNORMAL HIGH (ref 39.0–52.0)
Hemoglobin: 17.1 g/dL — ABNORMAL HIGH (ref 13.0–17.0)
MCH: 26.3 pg (ref 26.0–34.0)
MCHC: 31.9 g/dL (ref 30.0–36.0)
MCV: 82.6 fL (ref 80.0–100.0)
Platelets: 235 10*3/uL (ref 150–400)
RBC: 6.49 MIL/uL — ABNORMAL HIGH (ref 4.22–5.81)
RDW: 12.7 % (ref 11.5–15.5)
WBC: 7.5 10*3/uL (ref 4.0–10.5)
nRBC: 0 % (ref 0.0–0.2)

## 2019-08-26 ENCOUNTER — Other Ambulatory Visit: Payer: Self-pay

## 2019-08-27 ENCOUNTER — Inpatient Hospital Stay: Payer: 59

## 2019-08-27 ENCOUNTER — Other Ambulatory Visit: Payer: Self-pay

## 2019-08-27 ENCOUNTER — Inpatient Hospital Stay: Payer: 59 | Attending: Oncology

## 2019-08-27 VITALS — BP 131/78 | HR 68 | Temp 98.1°F | Resp 18

## 2019-08-27 DIAGNOSIS — D751 Secondary polycythemia: Secondary | ICD-10-CM

## 2019-08-27 LAB — CBC
HCT: 50.3 % (ref 39.0–52.0)
Hemoglobin: 16.1 g/dL (ref 13.0–17.0)
MCH: 26.2 pg (ref 26.0–34.0)
MCHC: 32 g/dL (ref 30.0–36.0)
MCV: 81.9 fL (ref 80.0–100.0)
Platelets: 275 10*3/uL (ref 150–400)
RBC: 6.14 MIL/uL — ABNORMAL HIGH (ref 4.22–5.81)
RDW: 12.5 % (ref 11.5–15.5)
WBC: 7.3 10*3/uL (ref 4.0–10.5)
nRBC: 0 % (ref 0.0–0.2)

## 2019-09-06 ENCOUNTER — Other Ambulatory Visit: Payer: Self-pay

## 2019-09-09 ENCOUNTER — Inpatient Hospital Stay: Payer: 59

## 2019-09-09 ENCOUNTER — Other Ambulatory Visit: Payer: Self-pay

## 2019-09-09 DIAGNOSIS — D751 Secondary polycythemia: Secondary | ICD-10-CM

## 2019-09-09 LAB — CBC
HCT: 47.6 % (ref 39.0–52.0)
Hemoglobin: 15.4 g/dL (ref 13.0–17.0)
MCH: 26.6 pg (ref 26.0–34.0)
MCHC: 32.4 g/dL (ref 30.0–36.0)
MCV: 82.4 fL (ref 80.0–100.0)
Platelets: 217 10*3/uL (ref 150–400)
RBC: 5.78 MIL/uL (ref 4.22–5.81)
RDW: 12.4 % (ref 11.5–15.5)
WBC: 5.3 10*3/uL (ref 4.0–10.5)
nRBC: 0 % (ref 0.0–0.2)

## 2019-09-10 ENCOUNTER — Inpatient Hospital Stay: Payer: 59

## 2019-09-24 ENCOUNTER — Inpatient Hospital Stay: Payer: 59 | Attending: Oncology

## 2019-09-24 ENCOUNTER — Encounter: Payer: Self-pay | Admitting: Oncology

## 2019-09-24 ENCOUNTER — Inpatient Hospital Stay: Payer: 59

## 2019-09-24 ENCOUNTER — Other Ambulatory Visit: Payer: Self-pay

## 2019-09-24 ENCOUNTER — Inpatient Hospital Stay (HOSPITAL_BASED_OUTPATIENT_CLINIC_OR_DEPARTMENT_OTHER): Payer: 59 | Admitting: Oncology

## 2019-09-24 VITALS — BP 135/91 | HR 88 | Temp 97.2°F | Resp 16 | Wt 287.0 lb

## 2019-09-24 DIAGNOSIS — Z79899 Other long term (current) drug therapy: Secondary | ICD-10-CM | POA: Diagnosis not present

## 2019-09-24 DIAGNOSIS — Z808 Family history of malignant neoplasm of other organs or systems: Secondary | ICD-10-CM | POA: Diagnosis not present

## 2019-09-24 DIAGNOSIS — N189 Chronic kidney disease, unspecified: Secondary | ICD-10-CM | POA: Diagnosis not present

## 2019-09-24 DIAGNOSIS — E7800 Pure hypercholesterolemia, unspecified: Secondary | ICD-10-CM | POA: Diagnosis not present

## 2019-09-24 DIAGNOSIS — Z8042 Family history of malignant neoplasm of prostate: Secondary | ICD-10-CM | POA: Insufficient documentation

## 2019-09-24 DIAGNOSIS — Z8349 Family history of other endocrine, nutritional and metabolic diseases: Secondary | ICD-10-CM | POA: Insufficient documentation

## 2019-09-24 DIAGNOSIS — Z791 Long term (current) use of non-steroidal anti-inflammatories (NSAID): Secondary | ICD-10-CM | POA: Insufficient documentation

## 2019-09-24 DIAGNOSIS — Z7951 Long term (current) use of inhaled steroids: Secondary | ICD-10-CM | POA: Diagnosis not present

## 2019-09-24 DIAGNOSIS — J45909 Unspecified asthma, uncomplicated: Secondary | ICD-10-CM | POA: Insufficient documentation

## 2019-09-24 DIAGNOSIS — D751 Secondary polycythemia: Secondary | ICD-10-CM | POA: Diagnosis present

## 2019-09-24 DIAGNOSIS — Z833 Family history of diabetes mellitus: Secondary | ICD-10-CM | POA: Diagnosis not present

## 2019-09-24 DIAGNOSIS — G4730 Sleep apnea, unspecified: Secondary | ICD-10-CM | POA: Insufficient documentation

## 2019-09-24 DIAGNOSIS — Z803 Family history of malignant neoplasm of breast: Secondary | ICD-10-CM | POA: Insufficient documentation

## 2019-09-24 LAB — CBC
HCT: 46.4 % (ref 39.0–52.0)
Hemoglobin: 14.9 g/dL (ref 13.0–17.0)
MCH: 26.3 pg (ref 26.0–34.0)
MCHC: 32.1 g/dL (ref 30.0–36.0)
MCV: 81.8 fL (ref 80.0–100.0)
Platelets: 248 10*3/uL (ref 150–400)
RBC: 5.67 MIL/uL (ref 4.22–5.81)
RDW: 12.9 % (ref 11.5–15.5)
WBC: 8.1 10*3/uL (ref 4.0–10.5)
nRBC: 0 % (ref 0.0–0.2)

## 2019-09-24 NOTE — Progress Notes (Signed)
Pt states since stopping the testosterone he feels like he has 1/2 the energy he had before. He also wears cpap and does well this. Wears it each night for about 6 to 6 1/2 hours .

## 2019-09-27 NOTE — Progress Notes (Signed)
Hematology/Oncology Consult note [facility]  Telephone:([phone] [phone]  Patient Care Team: Corky Downs, MD as PCP - General (Internal Medicine) Corky Downs, MD (Internal Medicine) Kieth Brightly, MD (General Surgery) Creig Hines, MD as Consulting Physician (Hematology and Oncology)   Name of the patient: Christopher Burns  [id number]  [birth year]   Date of visit: 09/27/19  Diagnosis-secondary polycythemia from testosterone replacement therapy  Chief complaint/ Reason for visit-routine follow-up of polycythemia for possible phlebotomy  Heme/Onc history: patient is a 49 year old male with a past medical history significant for hypercholesterolemia, obstructive sleep apnea, hypogonad is him and is on testosterone replacement therapy since about a year.  He has been referred to [place] for polycythemia.  I do not have his recent CBC in our system but patient states that his hemoglobin has been close to 18.  Testosterone was mainly started for fatigue.  He remains compliant with CPAP and uses it every night.  He has been a lifetime non-smoker.  No prior history of any DVT PE, heart attacks or strokes.    Interval history-testosterone has been on hold since December 2020.  Patient reports feeling more fatigued since his testosterone was stopped.  Denies other complaints at this time  ECOG PS- 0 Pain scale- 0   Review of systems- Review of Systems  Constitutional: Positive for malaise/fatigue. Negative for chills, fever and weight loss.  HENT: Negative for congestion, ear discharge and nosebleeds.   Eyes: Negative for blurred vision.  Respiratory: Negative for cough, hemoptysis, sputum production, shortness of breath and wheezing.   Cardiovascular: Negative for chest pain, palpitations, orthopnea and claudication.  Gastrointestinal: Negative for abdominal pain, blood in stool, constipation, diarrhea, heartburn, melena, nausea and  vomiting.  Genitourinary: Negative for dysuria, flank pain, frequency, hematuria and urgency.  Musculoskeletal: Negative for back pain, joint pain and myalgias.  Skin: Negative for rash.  Neurological: Negative for dizziness, tingling, focal weakness, seizures, weakness and headaches.  Endo/Heme/Allergies: Does not bruise/bleed easily.  Psychiatric/Behavioral: Negative for depression and suicidal ideas. The patient does not have insomnia.       Allergies  Allergen Reactions  . Other Itching and Rash    Pt was tested and positive for feline, canine and cockroaches. Pt has rash, itching, watery eyes. And is on allergy injections since 2017     Past Medical History:  Diagnosis Date  . Asthma   . Chronic kidney disease   . Complication of anesthesia    HICCUPS FOR 1 WEEK AFTER BICEP SURGERY  . Dizziness   . GERD (gastroesophageal reflux disease)   . HA (headache)   . Hemorrhoid   . High cholesterol   . Hypercholesteremia   . Polycythemia   . Sleep apnea    NO CPAP     Past Surgical History:  Procedure Laterality Date  . bisep Left 2007  . COLONOSCOPY WITH PROPOFOL N/A 03/01/2019   Procedure: COLONOSCOPY WITH PROPOFOL;  Surgeon: Wyline Mood, MD;  Location: [facility] ENDOSCOPY;  Service: Gastroenterology;  Laterality: N/A;  . KNEE SURGERY Right   . LASIK    . TONSILLECTOMY    . UMBILICAL HERNIA REPAIR N/A 07/29/2015   Procedure: HERNIA REPAIR UMBILICAL ADULT;  Surgeon: Kieth Brightly, MD;  Location: [facility] ORS;  Service: General;  Laterality: N/A;  . VASOTOMY      Social History   Socioeconomic History  . Marital status: Married    Spouse name: [name]  . Number of children: 1  .  Years of education: 66  . Highest education level: Not on file  Occupational History    Comment: [employer]  Tobacco Use  . Smoking status: Never Smoker  . Smokeless tobacco: Never Used  Substance and Sexual Activity  . Alcohol use: Not Currently  . Drug use: No  . Sexual  activity: Yes  Other Topics Concern  . Not on file  Social History Narrative   Patient lives at home  with his wife [name]).  Patient works full time for [employer].   Education some college.   Right handed.   Caffeine one shot of colombian  coffee.   Social Determinants of Health   Financial Resource Strain:   . Difficulty of Paying Living Expenses: Not on file  Food Insecurity:   . Worried About Charity fundraiser in the Last Year: Not on file  . Ran Out of Food in the Last Year: Not on file  Transportation Needs:   . Lack of Transportation (Medical): Not on file  . Lack of Transportation (Non-Medical): Not on file  Physical Activity:   . Days of Exercise per Week: Not on file  . Minutes of Exercise per Session: Not on file  Stress:   . Feeling of Stress : Not on file  Social Connections:   . Frequency of Communication with Friends and Family: Not on file  . Frequency of Social Gatherings with Friends and Family: Not on file  . Attends Religious Services: Not on file  . Active Member of Clubs or Organizations: Not on file  . Attends Archivist Meetings: Not on file  . Marital Status: Not on file  Intimate Partner Violence:   . Fear of Current or Ex-Partner: Not on file  . Emotionally Abused: Not on file  . Physically Abused: Not on file  . Sexually Abused: Not on file    Family History  Problem Relation Age of Onset  . Cancer Mother        breast  . Brain cancer Mother   . High Cholesterol Mother   . Diabetes Father   . High blood pressure Father   . High Cholesterol Father   . Prostate cancer Paternal Grandfather   . Cancer Maternal Grandmother   . Cancer Maternal Grandfather      Current Outpatient Medications:  .  Cholecalciferol (VITAMIN D) 2000 UNITS tablet, Take 2,000 Units by mouth daily., Disp: , Rfl:  .  EPIPEN 2-PAK 0.3 MG/0.3ML SOAJ injection, See admin instructions., Disp: , Rfl: 1 .  esomeprazole (NEXIUM) 40 MG capsule,  Take 40 mg by mouth daily. , Disp: , Rfl:  .  Fluticasone Furoate-Vilanterol (BREO ELLIPTA) 200-25 MCG/INH AEPB, Inhale 1 puff into the lungs every morning. , Disp: , Rfl:  .  hydrocortisone 2.5 % cream, Apply 1 application topically as needed. , Disp: , Rfl:  .  ibuprofen (ADVIL,MOTRIN) 200 MG tablet, Take 600 mg by mouth every 6 (six) hours as needed for headache or mild pain., Disp: , Rfl:  .  L-ARGININE PO, Take 1 tablet by mouth daily. , Disp: , Rfl:  .  lisinopril (PRINIVIL,ZESTRIL) 10 MG tablet, Take 10 mg daily by mouth., Disp: , Rfl: 4 .  lisinopril-hydrochlorothiazide (ZESTORETIC) 20-12.5 MG tablet, Take 2 tablets by mouth daily., Disp: , Rfl:  .  montelukast (SINGULAIR) 10 MG tablet, Take 10 mg by mouth at bedtime. , Disp: , Rfl: 11 .  Multiple Vitamins-Minerals (MULTIVITAMIN WITH MINERALS) tablet, Take 1  tablet by mouth daily., Disp: , Rfl:  .  omega-3 acid ethyl esters (LOVAZA) 1 G capsule, Take 1 g by mouth daily., Disp: , Rfl:  .  simvastatin (ZOCOR) 40 MG tablet, Take 40 mg by mouth at bedtime. , Disp: , Rfl:  .  VENTOLIN HFA 108 (90 BASE) MCG/ACT inhaler, USE 2 PUFFS 4 TIMES A DAY AS NEEDED, Disp: , Rfl: 0 .  NONFORMULARY OR COMPOUNDED ITEM,  [facility]:  Antifungal topical - Terbinafine 3%, Fluconazole 2%, Tea Tree Oil 5%, Urea 10%, Ibuprofen 2%, in DMSO suspension #21ml. Apply to affected toenail(s) once daily (at bedtime) or twice daily., Disp: 30 each, Rfl: 5 .  testosterone cypionate (DEPOTESTOSTERONE CYPIONATE) 200 MG/ML injection, INJECT 1 ML INTO MUSCLE ONCE A WEEK, Disp: , Rfl:   Physical exam:  Vitals:   09/24/19 1436  BP: (!) 135/91  Pulse: 88  Resp: 16  Temp: (!) 97.2 F (36.2 C)  TempSrc: Tympanic  Weight: 287 lb (130.2 kg)   Physical Exam HENT:     Head: Normocephalic and atraumatic.  Eyes:     Pupils: Pupils are equal, round, and reactive to light.  Cardiovascular:     Rate and Rhythm: Normal rate and regular rhythm.     Heart sounds: Normal  heart sounds.  Pulmonary:     Effort: Pulmonary effort is normal.     Breath sounds: Normal breath sounds.  Abdominal:     General: Bowel sounds are normal.     Palpations: Abdomen is soft.  Musculoskeletal:     Cervical back: Normal range of motion.  Skin:    General: Skin is warm and dry.  Neurological:     Mental Status: He is alert and oriented to person, place, and time.      CMP Latest Ref Rng & Units 07/29/2019  Glucose 70 - 99 mg/dL 712(W)  BUN 6 - 20 mg/dL 19  Creatinine 5.80 - 9.98 mg/dL 3.38(S)  Sodium 505 - 397 mmol/L 138  Potassium 3.5 - 5.1 mmol/L 4.3  Chloride 98 - 111 mmol/L 100  CO2 22 - 32 mmol/L 29  Calcium 8.9 - 10.3 mg/dL 9.5  Total Protein 6.5 - 8.1 g/dL 7.5  Total Bilirubin 0.3 - 1.2 mg/dL 0.6  Alkaline Phos 38 - 126 U/L 37(L)  AST 15 - 41 U/L 33  ALT 0 - 44 U/L 41   CBC Latest Ref Rng & Units 09/24/2019  WBC 4.0 - 10.5 K/uL 8.1  Hemoglobin 13.0 - 17.0 g/dL 67.3  Hematocrit 41.9 - 52.0 % 46.4  Platelets 150 - 400 K/uL 248      Assessment and plan- Patient is a 49 y.o. male with secondary polycythemia due to testosterone replacement therapy here for routine follow-up  Patient has undergone phlebotomy to bring his hematocrit down to less than 50 and has stopped his testosterone as well.  Today his hematocrit is 46 and he does not require a phlebotomy at this time.  However patient does report feeling significantly fatigued since stopping his testosterone.  It would be okay to restart testosterone at this time but I would recommend that he should be started on the lowest doses possible for symptomatic relief that can hopefully keep his hematocrit less than 50.  However if his hematocrit starts trending up after starting testosterone he will need to come for periodic phlebotomy  Patient will be discussing this with his primary care doctor next week and will likely restart testosterone in a week.  Will check CBC  monthly in 1 month 2 months in 3 months and I  will see him back in 3 months for a possible phlebotomy   Visit Diagnosis 1. Polycythemia, secondary      Dr. Owens Shark, MD, MPH [facility] [phone] 09/27/2019 12:21 PM

## 2019-10-29 ENCOUNTER — Inpatient Hospital Stay: Payer: 59

## 2019-10-31 ENCOUNTER — Inpatient Hospital Stay: Payer: 59 | Attending: Oncology

## 2019-10-31 ENCOUNTER — Inpatient Hospital Stay: Payer: 59

## 2019-10-31 DIAGNOSIS — D751 Secondary polycythemia: Secondary | ICD-10-CM | POA: Insufficient documentation

## 2019-11-04 ENCOUNTER — Inpatient Hospital Stay: Payer: 59

## 2019-11-04 ENCOUNTER — Other Ambulatory Visit: Payer: Self-pay

## 2019-11-04 DIAGNOSIS — D751 Secondary polycythemia: Secondary | ICD-10-CM | POA: Diagnosis present

## 2019-11-04 LAB — CBC
HCT: 43 % (ref 39.0–52.0)
Hemoglobin: 14.7 g/dL (ref 13.0–17.0)
MCH: 27.3 pg (ref 26.0–34.0)
MCHC: 34.2 g/dL (ref 30.0–36.0)
MCV: 79.8 fL — ABNORMAL LOW (ref 80.0–100.0)
Platelets: 263 10*3/uL (ref 150–400)
RBC: 5.39 MIL/uL (ref 4.22–5.81)
RDW: 13.6 % (ref 11.5–15.5)
WBC: 8 10*3/uL (ref 4.0–10.5)
nRBC: 0 % (ref 0.0–0.2)

## 2019-11-04 NOTE — Progress Notes (Signed)
No phlebotomy needed today since [name]'s HCT is 43.0 today. Patient is aware and was sent home.

## 2019-11-26 ENCOUNTER — Inpatient Hospital Stay: Payer: 59 | Attending: Oncology

## 2019-11-26 ENCOUNTER — Inpatient Hospital Stay: Payer: 59

## 2019-12-24 ENCOUNTER — Inpatient Hospital Stay (HOSPITAL_BASED_OUTPATIENT_CLINIC_OR_DEPARTMENT_OTHER): Payer: 59 | Admitting: Oncology

## 2019-12-24 ENCOUNTER — Inpatient Hospital Stay: Payer: 59

## 2019-12-24 ENCOUNTER — Other Ambulatory Visit: Payer: Self-pay

## 2019-12-24 ENCOUNTER — Encounter: Payer: Self-pay | Admitting: Oncology

## 2019-12-24 ENCOUNTER — Inpatient Hospital Stay: Payer: 59 | Attending: Oncology

## 2019-12-24 VITALS — BP 154/77 | HR 66 | Temp 97.8°F | Resp 16 | Wt 305.1 lb

## 2019-12-24 DIAGNOSIS — D751 Secondary polycythemia: Secondary | ICD-10-CM

## 2019-12-24 LAB — CBC
HCT: 42.2 % (ref 39.0–52.0)
Hemoglobin: 14.3 g/dL (ref 13.0–17.0)
MCH: 27.1 pg (ref 26.0–34.0)
MCHC: 33.9 g/dL (ref 30.0–36.0)
MCV: 80.1 fL (ref 80.0–100.0)
Platelets: 258 10*3/uL (ref 150–400)
RBC: 5.27 MIL/uL (ref 4.22–5.81)
RDW: 12.9 % (ref 11.5–15.5)
WBC: 7.5 10*3/uL (ref 4.0–10.5)
nRBC: 0 % (ref 0.0–0.2)

## 2019-12-24 NOTE — Progress Notes (Signed)
Patient here for oncology follow-up appointment, expresses no complaints or concerns at this time.    

## 2019-12-27 NOTE — Progress Notes (Signed)
Hematology/Oncology Consult note [facility]  Telephone:([phone] [phone]  Patient Care Team: Corky Downs, MD as PCP - General (Internal Medicine) Corky Downs, MD (Internal Medicine) Kieth Brightly, MD (General Surgery) Creig Hines, MD as Consulting Physician (Hematology and Oncology)   Name of the patient: Christopher Burns  [id number]  01-21-1971   Date of visit: 12/27/19  Diagnosis-polycythemia secondary to testosterone replacement therapy  Chief complaint/ Reason for visit-routine follow-up of polycythemia  Heme/Onc history: patient is a 49 year old male with a past medical history significant for hypercholesterolemia, obstructive sleep apnea, hypogonad is him and is on testosterone replacement therapy since about a year. He has been referred to [place] for polycythemia. I do not have his recent CBC in our system but patient states that his hemoglobin has been close to 18. Testosterone was mainly started for fatigue.He remains compliant with CPAP and uses it every night. He has been a lifetime non-smoker. No prior history of any DVT PE, heart attacks or strokes  Results of blood work from 07/29/2019 showed H&H of 18.8/58.7.  JAK2 mutation was negative.  EPO levels were normal.  Urinalysis was negative for hematuria.  CMP showed mildly elevated creatinine of 1.4.  Interval history-patient states that he has not gone back to be starting testosterone.  Overall he feels well and does not feel the need to go back on replacement therapy.  ECOG PS- 0 Pain scale- 0   Review of systems- Review of Systems  Constitutional: Negative for chills, fever, malaise/fatigue and weight loss.  HENT: Negative for congestion, ear discharge and nosebleeds.   Eyes: Negative for blurred vision.  Respiratory: Negative for cough, hemoptysis, sputum production, shortness of breath and wheezing.   Cardiovascular: Negative for chest pain, palpitations,  orthopnea and claudication.  Gastrointestinal: Negative for abdominal pain, blood in stool, constipation, diarrhea, heartburn, melena, nausea and vomiting.  Genitourinary: Negative for dysuria, flank pain, frequency, hematuria and urgency.  Musculoskeletal: Negative for back pain, joint pain and myalgias.  Skin: Negative for rash.  Neurological: Negative for dizziness, tingling, focal weakness, seizures, weakness and headaches.  Endo/Heme/Allergies: Does not bruise/bleed easily.  Psychiatric/Behavioral: Negative for depression and suicidal ideas. The patient does not have insomnia.       Allergies  Allergen Reactions  . Other Itching and Rash    Pt was tested and positive for feline, canine and cockroaches. Pt has rash, itching, watery eyes. And is on allergy injections since 2017     Past Medical History:  Diagnosis Date  . Asthma   . Chronic kidney disease   . Complication of anesthesia    HICCUPS FOR 1 WEEK AFTER BICEP SURGERY  . Dizziness   . GERD (gastroesophageal reflux disease)   . HA (headache)   . Hemorrhoid   . High cholesterol   . Hypercholesteremia   . Polycythemia   . Sleep apnea    NO CPAP     Past Surgical History:  Procedure Laterality Date  . bisep Left 2007  . COLONOSCOPY WITH PROPOFOL N/A 03/01/2019   Procedure: COLONOSCOPY WITH PROPOFOL;  Surgeon: Wyline Mood, MD;  [facility] END[facility]COPY;  Service: Gastroenterology;  Laterality: N/A;  . KNEE SURGERY Right   . LASIK    . TONSILLECTOMY    . UMBILICAL HERNIA REPAIR N/A 07/29/2015   Procedure: HERNIA REPAIR UMBILICAL ADULT;  Surgeon: Kieth Brightly, MD;  Location: [facility] ORS;  Service: General;  Laterality: N/A;  . VASOTOMY      Social  History   Socioeconomic History  . Marital status: Married    Spouse name: [name]  . Number of children: 1  . Years of education: 59  . Highest education level: Not on file  Occupational History    Comment: [employer]  Tobacco Use  . Smoking  status: Never Smoker  . Smokeless tobacco: Never Used  Substance and Sexual Activity  . Alcohol use: Not Currently  . Drug use: No  . Sexual activity: Yes  Other Topics Concern  . Not on file  Social History Narrative   Patient lives at home  with his wife [name]).  Patient works full time for [employer].   Education some college.   Right handed.   Caffeine one shot of colombian  coffee.   Social Determinants of Health   Financial Resource Strain:   . Difficulty of Paying Living Expenses:   Food Insecurity:   . Worried About Programme researcher, broadcasting/film/video in the Last Year:   . Barista in the Last Year:   Transportation Needs:   . Freight forwarder (Medical):   Marland Kitchen Lack of Transportation (Non-Medical):   Physical Activity:   . Days of Exercise per Week:   . Minutes of Exercise per Session:   Stress:   . Feeling of Stress :   Social Connections:   . Frequency of Communication with Friends and Family:   . Frequency of Social Gatherings with Friends and Family:   . Attends Religious Services:   . Active Member of Clubs or Organizations:   . Attends Banker Meetings:   Marland Kitchen Marital Status:   Intimate Partner Violence:   . Fear of Current or Ex-Partner:   . Emotionally Abused:   Marland Kitchen Physically Abused:   . Sexually Abused:     Family History  Problem Relation Age of Onset  . Cancer Mother        breast  . Brain cancer Mother   . High Cholesterol Mother   . Diabetes Father   . High blood pressure Father   . High Cholesterol Father   . Prostate cancer Paternal Grandfather   . Cancer Maternal Grandmother   . Cancer Maternal Grandfather      Current Outpatient Medications:  .  Cholecalciferol (VITAMIN D) 2000 UNITS tablet, Take 2,000 Units by mouth daily., Disp: , Rfl:  .  esomeprazole (NEXIUM) 40 MG capsule, Take 40 mg by mouth daily. , Disp: , Rfl:  .  Fluticasone Furoate-Vilanterol (BREO ELLIPTA) 200-25 MCG/INH AEPB, Inhale 1 puff into the  lungs every morning. , Disp: , Rfl:  .  lisinopril-hydrochlorothiazide (ZESTORETIC) 20-12.5 MG tablet, Take 2 tablets by mouth daily., Disp: , Rfl:  .  montelukast (SINGULAIR) 10 MG tablet, Take 10 mg by mouth at bedtime. , Disp: , Rfl: 11 .  Multiple Vitamins-Minerals (MULTIVITAMIN WITH MINERALS) tablet, Take 1 tablet by mouth daily., Disp: , Rfl:  .  NONFORMULARY OR COMPOUNDED ITEM, [facility]:  Antifungal topical - Terbinafine 3%, Fluconazole 2%, Tea Tree Oil 5%, Urea 10%, Ibuprofen 2%, in DMSO suspension #42ml. Apply to affected toenail(s) once daily (at bedtime) or twice daily., Disp: 30 each, Rfl: 5 .  omega-3 acid ethyl esters (LOVAZA) 1 G capsule, Take 1 g by mouth daily., Disp: , Rfl:  .  simvastatin (ZOCOR) 40 MG tablet, Take 40 mg by mouth at bedtime. , Disp: , Rfl:  .  VENTOLIN HFA 108 (90 BASE) MCG/ACT inhaler, USE 2 PUFFS  4 TIMES A DAY AS NEEDED, Disp: , Rfl: 0 .  EPIPEN 2-PAK 0.3 MG/0.3ML SOAJ injection, See admin instructions., Disp: , Rfl: 1 .  hydrocortisone 2.5 % cream, Apply 1 application topically as needed. , Disp: , Rfl:  .  ibuprofen (ADVIL,MOTRIN) 200 MG tablet, Take 600 mg by mouth every 6 (six) hours as needed for headache or mild pain., Disp: , Rfl:  .  L-ARGININE PO, Take 1 tablet by mouth daily. , Disp: , Rfl:  .  lisinopril (PRINIVIL,ZESTRIL) 10 MG tablet, Take 10 mg daily by mouth., Disp: , Rfl: 4 .  simvastatin (ZOCOR) 10 MG tablet, Take 10 mg by mouth at bedtime., Disp: , Rfl:  .  testosterone cypionate (DEPOTESTOSTERONE CYPIONATE) 200 MG/ML injection, INJECT 1 ML INTO MUSCLE ONCE A WEEK, Disp: , Rfl:   Physical exam:  Vitals:   12/24/19 1453  BP: (!) 154/77  Pulse: 66  Resp: 16  Temp: 97.8 F (36.6 C)  TempSrc: Tympanic  SpO2: 95%  Weight: (!) 305 lb 1.6 oz (138.4 kg)   Physical Exam Constitutional:      General: He is not in acute distress. Skin:    General: Skin is warm and dry.  Neurological:     Mental Status: He is alert and oriented  to person, place, and time.      CMP Latest Ref Rng & Units 07/29/2019  Glucose 70 - 99 mg/dL 105(H)  BUN 6 - 20 mg/dL 19  Creatinine 0.61 - 1.24 mg/dL 1.40(H)  Sodium 135 - 145 mmol/L 138  Potassium 3.5 - 5.1 mmol/L 4.3  Chloride 98 - 111 mmol/L 100  CO2 22 - 32 mmol/L 29  Calcium 8.9 - 10.3 mg/dL 9.5  Total Protein 6.5 - 8.1 g/dL 7.5  Total Bilirubin 0.3 - 1.2 mg/dL 0.6  Alkaline Phos 38 - 126 U/L 37(L)  AST 15 - 41 U/L 33  ALT 0 - 44 U/L 41   CBC Latest Ref Rng & Units 12/24/2019  WBC 4.0 - 10.5 K/uL 7.5  Hemoglobin 13.0 - 17.0 g/dL 14.3  Hematocrit 39.0 - 52.0 % 42.2  Platelets 150 - 400 K/uL 258     Assessment and plan- Patient is a 49 y.o. male with secondary polycythemia from testosterone replacement therapy  After patient stopped taking testosterone in December 2020 his H&H has gone down to 14.3/42.2.  White cell count and platelets are normal.  His prior labs did not show show any evidence of primary polycythemia vera.  As long as he stays off testosterone I do not expect him to have polycythemia.  He therefore does not require any follow-up with me at this time.  If he decides to go back on testosterone replacement therapy he will need periodic phlebotomies and will need to follow-up with me at that time.  Patient verbalized understanding   Visit Diagnosis 1. Polycythemia, secondary      Dr. Randa Evens, MD, MPH [facility] at [facility] [phone] 12/27/2019 10:50 AM

## 2020-01-20 ENCOUNTER — Ambulatory Visit: Payer: 59 | Admitting: Internal Medicine

## 2020-01-20 ENCOUNTER — Other Ambulatory Visit: Payer: Self-pay

## 2020-03-02 ENCOUNTER — Other Ambulatory Visit: Payer: Self-pay

## 2020-03-02 MED ORDER — SIMVASTATIN 10 MG PO TABS: 10.0000 mg | ORAL_TABLET | Freq: Every day | ORAL | 3 refills | Status: DC

## 2020-04-27 ENCOUNTER — Ambulatory Visit: Payer: 59 | Admitting: Internal Medicine

## 2020-04-27 NOTE — Progress Notes (Signed)
Reshedule

## 2020-05-04 ENCOUNTER — Other Ambulatory Visit: Payer: Self-pay

## 2020-05-04 ENCOUNTER — Ambulatory Visit (INDEPENDENT_AMBULATORY_CARE_PROVIDER_SITE_OTHER): Payer: 59 | Admitting: Internal Medicine

## 2020-05-04 VITALS — BP 123/75 | HR 82 | Ht 71.0 in | Wt 285.0 lb

## 2020-05-04 DIAGNOSIS — Z9989 Dependence on other enabling machines and devices: Secondary | ICD-10-CM | POA: Diagnosis not present

## 2020-05-04 DIAGNOSIS — G4733 Obstructive sleep apnea (adult) (pediatric): Secondary | ICD-10-CM

## 2020-05-04 DIAGNOSIS — Z7189 Other specified counseling: Secondary | ICD-10-CM | POA: Diagnosis not present

## 2020-05-04 NOTE — Patient Instructions (Signed)

## 2020-05-04 NOTE — Progress Notes (Signed)
[facility] [address] [place], [place] [address]  Pulmonary Sleep Medicine   Office Visit Note  Patient Name: Christopher Burns DOB: 06/17/1971 MRN [id number]    Chief Complaint: Obstructive Sleep Apnea visit  Brief History: Floyde is seen today for follow up The patient has a 3 year history of sleep apnea. Patient is using PAP nightly.  The patient feels more rested after sleeping with PAP. He goes to bed 11:30-midnight and gets up at 7 a.m. On call he gets less sleep. He is caring for his disabled dad along with his children. The patient reports benefiting from PAP use. Reported sleepiness is  improved and the Epworth Sleepiness Score is 6 out of 24. The patient does not take naps. The patient complains of the following: dry lips.  The compliance download shows excellent  compliance with an average use time of 7 hours. The AHI is 2.1  The patient does not complain of limb movements disrupting sleep.  ROS  General: (-) fever, (-) chills, (-) night sweat Nose and Sinuses: (-) nasal stuffiness or itchiness, (-) postnasal drip, (-) nosebleeds, (-) sinus trouble. Mouth and Throat: (-) sore throat, (-) hoarseness. Neck: (-) swollen glands, (-) enlarged thyroid, (-) neck pain. Respiratory: - cough, - shortness of breath, - wheezing. Neurologic: - numbness, - tingling. Psychiatric: - anxiety, - depression   Current Medication: Outpatient Encounter Medications as of 05/04/2020  Medication Sig Note  . Cholecalciferol (VITAMIN D) 2000 UNITS tablet Take 2,000 Units by mouth daily.   Marland Kitchen EPIPEN 2-PAK 0.3 MG/0.3ML SOAJ injection See admin instructions. 02/24/2015: Received from: External Pharmacy  . esomeprazole (NEXIUM) 40 MG capsule Take 40 mg by mouth daily.    . Fluticasone Furoate-Vilanterol (BREO ELLIPTA) 200-25 MCG/INH AEPB Inhale 1 puff into the lungs every morning.    . hydrocortisone 2.5 % cream Apply 1 application topically as needed.    Marland Kitchen ibuprofen (ADVIL,MOTRIN) 200  MG tablet Take 600 mg by mouth every 6 (six) hours as needed for headache or mild pain.   . L-ARGININE PO Take 1 tablet by mouth daily.    Marland Kitchen lisinopril (PRINIVIL,ZESTRIL) 10 MG tablet Take 10 mg daily by mouth.   Marland Kitchen lisinopril-hydrochlorothiazide (ZESTORETIC) 20-12.5 MG tablet Take 2 tablets by mouth daily.   . montelukast (SINGULAIR) 10 MG tablet Take 10 mg by mouth at bedtime.  07/10/2014: ....  . Multiple Vitamins-Minerals (MULTIVITAMIN WITH MINERALS) tablet Take 1 tablet by mouth daily.   . NONFORMULARY OR COMPOUNDED ITEM [facility]:  Antifungal topical - Terbinafine 3%, Fluconazole 2%, Tea Tree Oil 5%, Urea 10%, Ibuprofen 2%, in DMSO suspension #71ml. Apply to affected toenail(s) once daily (at bedtime) or twice daily.   Marland Kitchen omega-3 acid ethyl esters (LOVAZA) 1 G capsule Take 1 g by mouth daily.   . simvastatin (ZOCOR) 10 MG tablet Take 1 tablet (10 mg total) by mouth at bedtime.   Marland Kitchen testosterone cypionate (DEPOTESTOSTERONE CYPIONATE) 200 MG/ML injection INJECT 1 ML INTO MUSCLE ONCE A WEEK   . VENTOLIN HFA 108 (90 BASE) MCG/ACT inhaler USE 2 PUFFS 4 TIMES A DAY AS NEEDED 06/09/2015: Received from: External Pharmacy   No facility-administered encounter medications on file as of 05/04/2020.    Surgical History: Past Surgical History:  Procedure Laterality Date  . bisep Left 2007  . COLONOSCOPY WITH PROPOFOL N/A 03/01/2019   Procedure: COLONOSCOPY WITH PROPOFOL;  Surgeon: Wyline Mood, MD;  Location: [facility] ENDOSCOPY;  Service: Gastroenterology;  Laterality: N/A;  . KNEE SURGERY Right   .  LASIK    . TONSILLECTOMY    . UMBILICAL HERNIA REPAIR N/A 07/29/2015   Procedure: HERNIA REPAIR UMBILICAL ADULT;  Surgeon: Kieth Brightly, MD;  Location: [facility] ORS;  Service: General;  Laterality: N/A;  . VASOTOMY      Medical History: Past Medical History:  Diagnosis Date  . Asthma   . Chronic kidney disease   . Complication of anesthesia    HICCUPS FOR 1 WEEK AFTER BICEP SURGERY  .  Dizziness   . GERD (gastroesophageal reflux disease)   . HA (headache)   . Hemorrhoid   . High cholesterol   . Hypercholesteremia   . Polycythemia   . Sleep apnea    NO CPAP    Family History: Non contributory to the present illness  Social History: Social History   Socioeconomic History  . Marital status: Married    Spouse name: [name]  . Number of children: 1  . Years of education: 65  . Highest education level: Not on file  Occupational History    Comment: [employer]  Tobacco Use  . Smoking status: Never Smoker  . Smokeless tobacco: Never Used  Vaping Use  . Vaping Use: Never used  Substance and Sexual Activity  . Alcohol use: Not Currently  . Drug use: No  . Sexual activity: Yes  Other Topics Concern  . Not on file  Social History Narrative   Patient lives at home  with his wife [name]).  Patient works full time for [employer].   Education some college.   Right handed.   Caffeine one shot of colombian  coffee.   Social Determinants of Health   Financial Resource Strain:   . Difficulty of Paying Living Expenses: Not on file  Food Insecurity:   . Worried About Programme researcher, broadcasting/film/video in the Last Year: Not on file  . Ran Out of Food in the Last Year: Not on file  Transportation Needs:   . Lack of Transportation (Medical): Not on file  . Lack of Transportation (Non-Medical): Not on file  Physical Activity:   . Days of Exercise per Week: Not on file  . Minutes of Exercise per Session: Not on file  Stress:   . Feeling of Stress : Not on file  Social Connections:   . Frequency of Communication with Friends and Family: Not on file  . Frequency of Social Gatherings with Friends and Family: Not on file  . Attends Religious Services: Not on file  . Active Member of Clubs or Organizations: Not on file  . Attends Banker Meetings: Not on file  . Marital Status: Not on file  Intimate Partner Violence:   . Fear of Current or  Ex-Partner: Not on file  . Emotionally Abused: Not on file  . Physically Abused: Not on file  . Sexually Abused: Not on file    Vital Signs: There were no vitals taken for this visit.  Examina2tion: General Appearance: The patient is well-developed, well-nourished, and in no distress. Neck Circumference: 50 Skin: Gross inspection of skin unremarkable. Head: normocephalic, no gross deformities. Eyes: no gross deformities noted. ENT: ears appear grossly normal Neurologic: Alert and oriented. No involuntary movements.    EPWORTH SLEEPINESS SCALE:  Scale:  (0)= no chance of dozing; (1)= slight chance of dozing; (2)= moderate chance of dozing; (3)= high chance of dozing  Chance  Situtation    Sitting and reading: 0    Watching TV: 2  Sitting Inactive in public: 1    As a passenger in car: 1      Lying down to rest: 2    Sitting and talking: 0    Sitting quielty after lunch: 0    In a car, stopped in traffic: 0   TOTAL SCORE:   6 out of 24    SLEEP STUDIES:  1. PSG 05/08/17 AHI 67 SpO63min 53%   CPAP COMPLIANCE DATA:  Date Range: 04/30/19-04/28/20  Average Daily Use: 7 hours  Median Use: 7  Compliance for > 4 Hours: 100%  AHI: 23.1 respiratory events per hour  Days Used: 365/365  Mask Leak: 19.8  95th Percentile Pressure: 14  LABS: No results found for this or any previous visit (from the past 2160 hour(s)).  Radiology: No results found.  Assessment and Plan: Patient Active Problem List   Diagnosis Date Noted  . Polycythemia 07/29/2019  . High cholesterol   . Dizziness       The patient does tolerate PAP and reports benetfit benefit from PAP use. The patient was reminded how to clean his unit and advised to stop using the SoClean. His sleep time is limited but this is not something which can be increased at this time. The patient was also counselled on increasing his exercise. The compliance is excellent. The apnea is very well  controlled.   1. OSA- continue excellent compliance. Increase sleep time if able and discontinue the Soclean. 2. CPAP counseling: Discussed the importance of adhering to CPAP therapy. Discussed proper care and cleaning techniques for CPAP machine. 3. Obesity diet and exercise counseling provided  General Counseling: I have discussed the findings of the evaluation and examination with Remi Deter.  I have also discussed any further diagnostic evaluation thatmay be needed or ordered today. Rawley verbalizes understanding of the findings of todays visit. We also reviewed his medications today and discussed drug interactions and side effects including but not limited excessive drowsiness and altered mental states. We also discussed that there is always a risk not just to him but also people around him. he has been encouraged to call the office with any questions or concerns that should arise related to todays visit.  I have personally obtained a history, examined the patient, evaluated laboratory and imaging results, formulated the assessment and plan and placed orders.  This patient was seen by Leeanne Deed AGNP-C in Collaboration with Dr. Freda Munro as a part of collaborative care agreement.  Valentino Hue Sol Blazing, PhD, FAASM  Diplomate, American Board of Sleep Medicine    Yevonne Pax, MD [facility] Diplomate ABMS Pulmonary and Critical Care Medicine Sleep medicine

## 2020-05-21 ENCOUNTER — Other Ambulatory Visit: Payer: Self-pay

## 2020-05-21 ENCOUNTER — Ambulatory Visit (INDEPENDENT_AMBULATORY_CARE_PROVIDER_SITE_OTHER): Payer: 59 | Admitting: Family Medicine

## 2020-05-21 ENCOUNTER — Encounter: Payer: Self-pay | Admitting: Family Medicine

## 2020-05-21 VITALS — BP 141/91 | HR 88 | Ht 71.0 in | Wt 281.0 lb

## 2020-05-21 DIAGNOSIS — E782 Mixed hyperlipidemia: Secondary | ICD-10-CM | POA: Diagnosis not present

## 2020-05-21 DIAGNOSIS — I10 Essential (primary) hypertension: Secondary | ICD-10-CM | POA: Insufficient documentation

## 2020-05-21 DIAGNOSIS — E662 Morbid (severe) obesity with alveolar hypoventilation: Secondary | ICD-10-CM | POA: Diagnosis not present

## 2020-05-21 NOTE — Progress Notes (Signed)
Established Patient Office Visit  SUBJECTIVE:  Subjective  Patient ID: Christopher Burns, male    DOB: 03/13/1971  Age: 49 y.o. MRN: [id number]  CC:  Chief Complaint  Patient presents with  . Annual Exam    HPI Christopher Burns is a 49 y.o. male presenting today for an annual exam.   He continues to try and lose weight. He is down 24 lbs since his visit in 12/2019 with Dr. Smith Robert. He is still interested in bariatric surgery. His primary weight loss method is through dietary changes; he has cut carbohydrates out of his diet mostly. His blood pressure today is 141/91.   He feels well overall. He notes that he feels like he has some chest tightness since he had COVID19 last year. He denies chest pain.     Past Medical History:  Diagnosis Date  . Asthma   . Chronic kidney disease   . Complication of anesthesia    HICCUPS FOR 1 WEEK AFTER BICEP SURGERY  . Dizziness   . GERD (gastroesophageal reflux disease)   . HA (headache)   . Hemorrhoid   . High cholesterol   . Hypercholesteremia   . Polycythemia   . Sleep apnea    NO CPAP    Past Surgical History:  Procedure Laterality Date  . bisep Left 2007  . COLONOSCOPY WITH PROPOFOL N/A 03/01/2019   Procedure: COLONOSCOPY WITH PROPOFOL;  Surgeon: Wyline Mood, MD;  Location: [facility] ENDOSCOPY;  Service: Gastroenterology;  Laterality: N/A;  . KNEE SURGERY Right   . LASIK    . TONSILLECTOMY    . UMBILICAL HERNIA REPAIR N/A 07/29/2015   Procedure: HERNIA REPAIR UMBILICAL ADULT;  Surgeon: Kieth Brightly, MD;  Location: [facility] ORS;  Service: General;  Laterality: N/A;  . VASOTOMY      Family History  Problem Relation Age of Onset  . Cancer Mother        breast  . Brain cancer Mother   . High Cholesterol Mother   . Diabetes Father   . High blood pressure Father   . High Cholesterol Father   . Prostate cancer Paternal Grandfather   . Cancer Maternal Grandmother   . Cancer Maternal Grandfather     Social History    Socioeconomic History  . Marital status: Married    Spouse name: [name]  . Number of children: 1  . Years of education: 21  . Highest education level: Not on file  Occupational History    Comment: [employer]  Tobacco Use  . Smoking status: Never Smoker  . Smokeless tobacco: Never Used  Vaping Use  . Vaping Use: Never used  Substance and Sexual Activity  . Alcohol use: Not Currently  . Drug use: No  . Sexual activity: Yes  Other Topics Concern  . Not on file  Social History Narrative   Patient lives at home  with his wife [name]).  Patient works full time for [employer].   Education some college.   Right handed.   Caffeine one shot of colombian  coffee.   Social Determinants of Health   Financial Resource Strain:   . Difficulty of Paying Living Expenses: Not on file  Food Insecurity:   . Worried About Programme researcher, broadcasting/film/video in the Last Year: Not on file  . Ran Out of Food in the Last Year: Not on file  Transportation Needs:   . Lack of Transportation (Medical): Not on file  . Lack of Transportation (Non-Medical):  Not on file  Physical Activity:   . Days of Exercise per Week: Not on file  . Minutes of Exercise per Session: Not on file  Stress:   . Feeling of Stress : Not on file  Social Connections:   . Frequency of Communication with Friends and Family: Not on file  . Frequency of Social Gatherings with Friends and Family: Not on file  . Attends Religious Services: Not on file  . Active Member of Clubs or Organizations: Not on file  . Attends Banker Meetings: Not on file  . Marital Status: Not on file  Intimate Partner Violence:   . Fear of Current or Ex-Partner: Not on file  . Emotionally Abused: Not on file  . Physically Abused: Not on file  . Sexually Abused: Not on file     Current Outpatient Medications:  .  Cholecalciferol (VITAMIN D) 2000 UNITS tablet, Take 2,000 Units by mouth daily., Disp: , Rfl:  .  EPIPEN 2-PAK 0.3  MG/0.3ML SOAJ injection, See admin instructions., Disp: , Rfl: 1 .  esomeprazole (NEXIUM) 40 MG capsule, Take 40 mg by mouth daily. , Disp: , Rfl:  .  lisinopril-hydrochlorothiazide (ZESTORETIC) 20-12.5 MG tablet, Take 2 tablets by mouth daily., Disp: , Rfl:  .  montelukast (SINGULAIR) 10 MG tablet, Take 10 mg by mouth at bedtime. , Disp: , Rfl: 11 .  Multiple Vitamins-Minerals (MULTIVITAMIN WITH MINERALS) tablet, Take 1 tablet by mouth daily., Disp: , Rfl:  .  omega-3 acid ethyl esters (LOVAZA) 1 G capsule, Take 1 g by mouth daily., Disp: , Rfl:  .  simvastatin (ZOCOR) 10 MG tablet, Take 1 tablet (10 mg total) by mouth at bedtime., Disp: 30 tablet, Rfl: 3 .  VENTOLIN HFA 108 (90 BASE) MCG/ACT inhaler, USE 2 PUFFS 4 TIMES A DAY AS NEEDED, Disp: , Rfl: 0   Allergies  Allergen Reactions  . Other Itching and Rash    Pt was tested and positive for feline, canine and cockroaches. Pt has rash, itching, watery eyes. And is on allergy injections since 2017    ROS Review of Systems  Constitutional: Negative.   HENT: Negative.   Eyes: Negative.   Respiratory: Positive for chest tightness. Negative for cough and shortness of breath.   Cardiovascular: Negative.  Negative for chest pain and palpitations.  Gastrointestinal: Negative.   Endocrine: Negative.   Genitourinary: Negative.   Musculoskeletal: Negative.   Skin: Negative.   Allergic/Immunologic: Negative.   Neurological: Negative.   Hematological: Negative.   Psychiatric/Behavioral: Negative.   All other systems reviewed and are negative.    OBJECTIVE:    Physical Exam Vitals reviewed.  Constitutional:      General: He is not in acute distress.    Appearance: Normal appearance. He is obese.  HENT:     Mouth/Throat:     Mouth: Mucous membranes are moist.  Eyes:     Pupils: Pupils are equal, round, and reactive to light.  Neck:     Vascular: No carotid bruit.  Cardiovascular:     Rate and Rhythm: Normal rate and regular  rhythm.     Pulses: Normal pulses.     Heart sounds: Normal heart sounds.  Pulmonary:     Effort: Pulmonary effort is normal.     Breath sounds: Normal breath sounds.  Abdominal:     General: Bowel sounds are normal.     Palpations: Abdomen is soft. There is no hepatomegaly, splenomegaly or mass.     Tenderness:  There is no abdominal tenderness.     Hernia: No hernia is present.  Musculoskeletal:     Cervical back: Neck supple.     Right lower leg: No edema.     Left lower leg: No edema.  Skin:    Findings: No rash.  Neurological:     Mental Status: He is alert and oriented to person, place, and time.     Motor: No weakness.  Psychiatric:        Mood and Affect: Mood normal.        Behavior: Behavior normal.     BP (!) 141/91   Pulse 88   Ht 5\' 11"  (1.803 m)   Wt 281 lb (127.5 kg)   BMI 39.19 kg/m  Wt Readings from Last 3 Encounters:  05/21/20 281 lb (127.5 kg)  05/04/20 285 lb (129.3 kg)  12/24/19 (!) 305 lb 1.6 oz (138.4 kg)    Health Maintenance Due  Topic Date Due  . Hepatitis C Screening  Never done  . COVID-19 Vaccine (1) Never done  . HIV Screening  Never done  . TETANUS/TDAP  Never done  . INFLUENZA VACCINE  Never done    There are no preventive care reminders to display for this patient.  CBC Latest Ref Rng & Units 12/24/2019 11/04/2019 09/24/2019  WBC 4.0 - 10.5 K/uL 7.5 8.0 8.1  Hemoglobin 13.0 - 17.0 g/dL 11/22/2019 27.5 17.0  Hematocrit 39 - 52 % 42.2 43.0 46.4  Platelets 150 - 400 K/uL 258 263 248   CMP Latest Ref Rng & Units 07/29/2019 07/10/2014  Glucose 70 - 99 mg/dL 07/12/2014) 494(W)  BUN 6 - 20 mg/dL 19 17  Creatinine 967(R - 1.24 mg/dL 9.16) 3.84(Y  Sodium 6.59 - 145 mmol/L 138 139  Potassium 3.5 - 5.1 mmol/L 4.3 4.3  Chloride 98 - 111 mmol/L 100 102  CO2 22 - 32 mmol/L 29 23  Calcium 8.9 - 10.3 mg/dL 9.5 9.6  Total Protein 6.5 - 8.1 g/dL 7.5 7.2  Total Bilirubin 0.3 - 1.2 mg/dL 0.6 0.3  Alkaline Phos 38 - 126 U/L 37(L) 39  AST 15 - 41 U/L 33 35    ALT 0 - 44 U/L 41 47    Lab Results  Component Value Date   TSH 2.720 07/21/2014   Lab Results  Component Value Date   ALBUMIN 4.3 07/29/2019   ANIONGAP 9 07/29/2019   No results found for: CHOL, HDL, LDLCALC, CHOLHDL No results found for: TRIG No results found for: HGBA1C    ASSESSMENT & PLAN:   Problem List Items Addressed This Visit      Cardiovascular and Mediastinum   Hypertension    Patient's blood pressure is within the desired range. Medication side effects include: no side effects noted Continue current treatment regimen.         Other   Mixed hyperlipidemia    - Pt's HLD is stable on simvistatin (Zocor). - Education regarding hyperlipidemia was given. Questions answered.       Morbid obesity with alveolar hypoventilation (HCC) - Primary    - Pt is morbid obesity II = 35-39.9 - activation or motivation to change monitored - mutual realistic weight loss goal set - patient perspective on previous weight loss attempts identified - success praised  He is wanting bariatric surgery due to his years of dieting and exercising without meeting healthy weight guidelines. He also wants the weight loss for disease management and joint health.  No orders of the defined types were placed in this encounter.   Follow-up: Return in about 7 weeks (around 07/09/2020) for Lab Visit.    Irish LackKANADY, Shermar Friedland, FNP [facility] [address], [place], [place] [id number]   By signing my name below, I, [employer], attest that this documentation has been prepared under the direction and in the presence of Irish LackJarrod Shaneen Reeser, FNP Electronically Signed: Irish LackKANADY, Aaryn Parrilla, FNP 05/21/20, 2:49 PM  I personally performed the services described in this documentation, which was SCRIBED in my presence. The recorded information has been reviewed and considered accurate. It has been edited as necessary during review. Irish LackJarrod Mickaela Starlin, FNP

## 2020-05-21 NOTE — Assessment & Plan Note (Signed)
-   Pt's HLD is stable on simvistatin (Zocor). - Education regarding hyperlipidemia was given. Questions answered.

## 2020-05-21 NOTE — Assessment & Plan Note (Signed)
Patient's blood pressure is within the desired range. Medication side effects include: no side effects noted Continue current treatment regimen.  

## 2020-05-21 NOTE — Assessment & Plan Note (Signed)
-   Pt is morbid obesity II = 35-39.9 - activation or motivation to change monitored - mutual realistic weight loss goal set - patient perspective on previous weight loss attempts identified - success praised  He is wanting bariatric surgery due to his years of dieting and exercising without meeting healthy weight guidelines. He also wants the weight loss for disease management and joint health.

## 2020-05-21 NOTE — Patient Instructions (Signed)
Calorie Counting for Weight Loss °Calories are units of energy. Your body needs a certain amount of calories from food to keep you going throughout the day. When you eat more calories than your body needs, your body stores the extra calories as fat. When you eat fewer calories than your body needs, your body burns fat to get the energy it needs. °Calorie counting means keeping track of how many calories you eat and drink each day. Calorie counting can be helpful if you need to lose weight. If you make sure to eat fewer calories than your body needs, you should lose weight. Ask your health care provider what a healthy weight is for you. °For calorie counting to work, you will need to eat the right number of calories in a day in order to lose a healthy amount of weight per week. A dietitian can help you determine how many calories you need in a day and will give you suggestions on how to reach your calorie goal. °· A healthy amount of weight to lose per week is usually 1-2 lb (0.5-0.9 kg). This usually means that your daily calorie intake should be reduced by 500-750 calories. °· Eating 1,200 - 1,500 calories per day can help most women lose weight. °· Eating 1,500 - 1,800 calories per day can help most men lose weight. °What is my plan? °My goal is to have __________ calories per day. °If I have this many calories per day, I should lose around __________ pounds per week. °What do I need to know about calorie counting? °In order to meet your daily calorie goal, you will need to: °· Find out how many calories are in each food you would like to eat. Try to do this before you eat. °· Decide how much of the food you plan to eat. °· Write down what you ate and how many calories it had. Doing this is called keeping a food log. °To successfully lose weight, it is important to balance calorie counting with a healthy lifestyle that includes regular activity. Aim for 150 minutes of moderate exercise (such as walking) or 75  minutes of vigorous exercise (such as running) each week. °Where do I find calorie information? ° °The number of calories in a food can be found on a Nutrition Facts label. If a food does not have a Nutrition Facts label, try to look up the calories online or ask your dietitian for help. °Remember that calories are listed per serving. If you choose to have more than one serving of a food, you will have to multiply the calories per serving by the amount of servings you plan to eat. For example, the label on a package of bread might say that a serving size is 1 slice and that there are 90 calories in a serving. If you eat 1 slice, you will have eaten 90 calories. If you eat 2 slices, you will have eaten 180 calories. °How do I keep a food log? °Immediately after each meal, record the following information in your food log: °· What you ate. Don't forget to include toppings, sauces, and other extras on the food. °· How much you ate. This can be measured in cups, ounces, or number of items. °· How many calories each food and drink had. °· The total number of calories in the meal. °Keep your food log near you, such as in a small notebook in your pocket, or use a mobile app or website. Some programs will calculate   calories for you and show you how many calories you have left for the day to meet your goal. °What are some calorie counting tips? ° °1. Use your calories on foods and drinks that will fill you up and not leave you hungry: °? Some examples of foods that fill you up are nuts and nut butters, vegetables, lean proteins, and high-fiber foods like whole grains. High-fiber foods are foods with more than 5 g fiber per serving. °? Drinks such as sodas, specialty coffee drinks, alcohol, and juices have a lot of calories, yet do not fill you up. °2. Eat nutritious foods and avoid empty calories. Empty calories are calories you get from foods or beverages that do not have many vitamins or protein, such as candy, sweets, and  soda. It is better to have a nutritious high-calorie food (such as an avocado) than a food with few nutrients (such as a bag of chips). °3. Know how many calories are in the foods you eat most often. This will help you calculate calorie counts faster. °4. Pay attention to calories in drinks. Low-calorie drinks include water and unsweetened drinks. °5. Pay attention to nutrition labels for "low fat" or "fat free" foods. These foods sometimes have the same amount of calories or more calories than the full fat versions. They also often have added sugar, starch, or salt, to make up for flavor that was removed with the fat. °6. Find a way of tracking calories that works for you. Get creative. Try different apps or programs if writing down calories does not work for you. °What are some portion control tips? °· Know how many calories are in a serving. This will help you know how many servings of a certain food you can have. °· Use a measuring cup to measure serving sizes. You could also try weighing out portions on a kitchen scale. With time, you will be able to estimate serving sizes for some foods. °· Take some time to put servings of different foods on your favorite plates, bowls, and cups so you know what a serving looks like. °· Try not to eat straight from a bag or box. Doing this can lead to overeating. Put the amount you would like to eat in a cup or on a plate to make sure you are eating the right portion. °· Use smaller plates, glasses, and bowls to prevent overeating. °· Try not to multitask (for example, watch TV or use your computer) while eating. If it is time to eat, sit down at a table and enjoy your food. This will help you to know when you are full. It will also help you to be aware of what you are eating and how much you are eating. °What are tips for following this plan? °Reading food labels °· Check the calorie count compared to the serving size. The serving size may be smaller than what you are used  to eating. °· Check the source of the calories. Make sure the food you are eating is high in vitamins and protein and low in saturated and trans fats. °Shopping °· Read nutrition labels while you shop. This will help you make healthy decisions before you decide to purchase your food. °· Make a grocery list and stick to it. °Cooking °· Try to cook your favorite foods in a healthier way. For example, try baking instead of frying. °· Use low-fat dairy products. °Meal planning °· Use more fruits and vegetables. Half of your plate should be fruits   and vegetables. °· Include lean proteins like poultry and fish. °How do I count calories when eating out? °1. Ask for smaller portion sizes. °2. Consider sharing an entree and sides instead of getting your own entree. °3. If you get your own entree, eat only half. Ask for a box at the beginning of your meal and put the rest of your entree in it so you are not tempted to eat it. °4. If calories are listed on the menu, choose the lower calorie options. °5. Choose dishes that include vegetables, fruits, whole grains, low-fat dairy products, and lean protein. °6. Choose items that are boiled, broiled, grilled, or steamed. Stay away from items that are buttered, battered, fried, or served with cream sauce. Items labeled "crispy" are usually fried, unless stated otherwise. °7. Choose water, low-fat milk, unsweetened iced tea, or other drinks without added sugar. If you want an alcoholic beverage, choose a lower calorie option such as a glass of wine or light beer. °8. Ask for dressings, sauces, and syrups on the side. These are usually high in calories, so you should limit the amount you eat. °9. If you want a salad, choose a garden salad and ask for grilled meats. Avoid extra toppings like bacon, cheese, or fried items. Ask for the dressing on the side, or ask for olive oil and vinegar or lemon to use as dressing. °10. Estimate how many servings of a food you are given. For example,  a serving of cooked rice is ½ cup or about the size of half a baseball. Knowing serving sizes will help you be aware of how much food you are eating at restaurants. The list below tells you how big or small some common portion sizes are based on everyday objects: °? 1 oz--4 stacked dice. °? 3 oz--1 deck of cards. °? 1 tsp--1 die. °? 1 Tbsp--½ a ping-pong ball. °? 2 Tbsp--1 ping-pong ball. °? ½ cup--½ baseball. °? 1 cup--1 baseball. °Summary °· Calorie counting means keeping track of how many calories you eat and drink each day. If you eat fewer calories than your body needs, you should lose weight. °· A healthy amount of weight to lose per week is usually 1-2 lb (0.5-0.9 kg). This usually means reducing your daily calorie intake by 500-750 calories. °· The number of calories in a food can be found on a Nutrition Facts label. If a food does not have a Nutrition Facts label, try to look up the calories online or ask your dietitian for help. °· Use your calories on foods and drinks that will fill you up, and not on foods and drinks that will leave you hungry. °· Use smaller plates, glasses, and bowls to prevent overeating. °This information is not intended to replace advice given to you by your health care provider. Make sure you discuss any questions you have with your health care provider. ° °Document Revised: 04/20/2018 Document Reviewed: 07/01/2016 °Elsevier Patient Education © 2020 [employer] Inc. ° ° °

## 2020-07-01 ENCOUNTER — Other Ambulatory Visit: Payer: Self-pay | Admitting: Internal Medicine

## 2020-07-06 ENCOUNTER — Other Ambulatory Visit: Payer: Self-pay | Admitting: *Deleted

## 2020-07-06 MED ORDER — VENTOLIN HFA 108 (90 BASE) MCG/ACT IN AERS: INHALATION_SPRAY | RESPIRATORY_TRACT | 3 refills | Status: DC

## 2020-07-17 ENCOUNTER — Ambulatory Visit: Payer: 59 | Admitting: Internal Medicine

## 2020-08-04 ENCOUNTER — Other Ambulatory Visit: Payer: Self-pay | Admitting: Internal Medicine

## 2020-08-04 DIAGNOSIS — Z0000 Encounter for general adult medical examination without abnormal findings: Secondary | ICD-10-CM

## 2020-09-03 ENCOUNTER — Ambulatory Visit: Payer: 59 | Admitting: Podiatry

## 2020-09-07 ENCOUNTER — Ambulatory Visit: Payer: 59 | Admitting: Podiatry

## 2020-09-09 ENCOUNTER — Other Ambulatory Visit: Payer: Self-pay

## 2020-09-09 ENCOUNTER — Encounter: Payer: Self-pay | Admitting: Podiatry

## 2020-09-09 ENCOUNTER — Ambulatory Visit: Payer: 59 | Admitting: Podiatry

## 2020-09-09 DIAGNOSIS — M779 Enthesopathy, unspecified: Secondary | ICD-10-CM | POA: Diagnosis not present

## 2020-09-09 MED ORDER — TRIAMCINOLONE ACETONIDE 10 MG/ML IJ SUSP
10.0000 mg | Freq: Once | INTRAMUSCULAR | Status: AC
  Administered 2020-09-09: 10 mg

## 2020-09-13 NOTE — Progress Notes (Signed)
Subjective:   Patient ID: Gaetano Net, male   DOB: 50 y.o.   MRN: [id number]   HPI Patient presents with pain in the right foot stating that this is reoccurred and its been about 2 years since this happened   ROS      Objective:  Physical Exam  Neurovascular status intact negative Denna Haggard' sign noted with patient found to have painful area around the fifth MPJ with fluid buildup and lesion formation     Assessment:  Capsulitis fifth MPJ right with pain     Plan:  Sterile prep injected the capsule 3 mg dexamethasone Kenalog 5 mg Xylocaine and went ahead today and debrided lesion advised on rigid bottom shoes reappoint as needed  X-rays were negative for signs of any bone changes associated with condition or indications of arthritis stress fracture

## 2020-10-22 ENCOUNTER — Other Ambulatory Visit: Payer: Self-pay

## 2020-10-22 ENCOUNTER — Emergency Department: Payer: 59

## 2020-10-22 ENCOUNTER — Inpatient Hospital Stay
Admission: EM | Admit: 2020-10-22 | Discharge: 2020-10-25 | DRG: 163 | Disposition: A | Discharge status: Home / Self Care | Payer: 59 | Source: Other Acute Inpatient Hospital | Attending: Internal Medicine | Admitting: Internal Medicine

## 2020-10-22 ENCOUNTER — Encounter: Payer: Self-pay | Admitting: Emergency Medicine

## 2020-10-22 ENCOUNTER — Encounter: Payer: Self-pay | Admitting: Family Medicine

## 2020-10-22 ENCOUNTER — Ambulatory Visit (INDEPENDENT_AMBULATORY_CARE_PROVIDER_SITE_OTHER): Payer: 59 | Admitting: Family Medicine

## 2020-10-22 VITALS — BP 116/77 | HR 104 | Resp 30 | Ht 71.0 in | Wt 289.1 lb

## 2020-10-22 DIAGNOSIS — Z9989 Dependence on other enabling machines and devices: Secondary | ICD-10-CM

## 2020-10-22 DIAGNOSIS — I82431 Acute embolism and thrombosis of right popliteal vein: Secondary | ICD-10-CM | POA: Diagnosis present

## 2020-10-22 DIAGNOSIS — Z6839 Body mass index (BMI) 39.0-39.9, adult: Secondary | ICD-10-CM

## 2020-10-22 DIAGNOSIS — I10 Essential (primary) hypertension: Secondary | ICD-10-CM | POA: Diagnosis not present

## 2020-10-22 DIAGNOSIS — J9601 Acute respiratory failure with hypoxia: Principal | ICD-10-CM | POA: Diagnosis present

## 2020-10-22 DIAGNOSIS — Z8249 Family history of ischemic heart disease and other diseases of the circulatory system: Secondary | ICD-10-CM

## 2020-10-22 DIAGNOSIS — I2699 Other pulmonary embolism without acute cor pulmonale: Secondary | ICD-10-CM | POA: Diagnosis not present

## 2020-10-22 DIAGNOSIS — T502X5A Adverse effect of carbonic-anhydrase inhibitors, benzothiadiazides and other diuretics, initial encounter: Secondary | ICD-10-CM | POA: Diagnosis present

## 2020-10-22 DIAGNOSIS — I259 Chronic ischemic heart disease, unspecified: Secondary | ICD-10-CM | POA: Diagnosis not present

## 2020-10-22 DIAGNOSIS — Z79899 Other long term (current) drug therapy: Secondary | ICD-10-CM

## 2020-10-22 DIAGNOSIS — E782 Mixed hyperlipidemia: Secondary | ICD-10-CM | POA: Diagnosis present

## 2020-10-22 DIAGNOSIS — Z8616 Personal history of COVID-19: Secondary | ICD-10-CM

## 2020-10-22 DIAGNOSIS — Z803 Family history of malignant neoplasm of breast: Secondary | ICD-10-CM

## 2020-10-22 DIAGNOSIS — N179 Acute kidney failure, unspecified: Secondary | ICD-10-CM | POA: Diagnosis present

## 2020-10-22 DIAGNOSIS — K648 Other hemorrhoids: Secondary | ICD-10-CM | POA: Diagnosis present

## 2020-10-22 DIAGNOSIS — E291 Testicular hypofunction: Secondary | ICD-10-CM | POA: Diagnosis present

## 2020-10-22 DIAGNOSIS — G4733 Obstructive sleep apnea (adult) (pediatric): Secondary | ICD-10-CM | POA: Diagnosis present

## 2020-10-22 DIAGNOSIS — K644 Residual hemorrhoidal skin tags: Secondary | ICD-10-CM | POA: Diagnosis present

## 2020-10-22 DIAGNOSIS — K219 Gastro-esophageal reflux disease without esophagitis: Secondary | ICD-10-CM | POA: Diagnosis present

## 2020-10-22 DIAGNOSIS — Z808 Family history of malignant neoplasm of other organs or systems: Secondary | ICD-10-CM

## 2020-10-22 DIAGNOSIS — Z8042 Family history of malignant neoplasm of prostate: Secondary | ICD-10-CM

## 2020-10-22 DIAGNOSIS — I2602 Saddle embolus of pulmonary artery with acute cor pulmonale: Secondary | ICD-10-CM

## 2020-10-22 DIAGNOSIS — I2609 Other pulmonary embolism with acute cor pulmonale: Secondary | ICD-10-CM | POA: Diagnosis present

## 2020-10-22 DIAGNOSIS — Z833 Family history of diabetes mellitus: Secondary | ICD-10-CM

## 2020-10-22 DIAGNOSIS — I129 Hypertensive chronic kidney disease with stage 1 through stage 4 chronic kidney disease, or unspecified chronic kidney disease: Secondary | ICD-10-CM | POA: Diagnosis present

## 2020-10-22 DIAGNOSIS — N182 Chronic kidney disease, stage 2 (mild): Secondary | ICD-10-CM | POA: Diagnosis present

## 2020-10-22 DIAGNOSIS — Z91048 Other nonmedicinal substance allergy status: Secondary | ICD-10-CM

## 2020-10-22 DIAGNOSIS — Z83438 Family history of other disorder of lipoprotein metabolism and other lipidemia: Secondary | ICD-10-CM

## 2020-10-22 DIAGNOSIS — D751 Secondary polycythemia: Secondary | ICD-10-CM | POA: Diagnosis present

## 2020-10-22 DIAGNOSIS — J45909 Unspecified asthma, uncomplicated: Secondary | ICD-10-CM | POA: Diagnosis present

## 2020-10-22 LAB — PROTIME-INR
INR: 1 (ref 0.8–1.2)
Prothrombin Time: 13.1 seconds (ref 11.4–15.2)

## 2020-10-22 LAB — RESP PANEL BY RT-PCR (FLU A&B, COVID) ARPGX2
Influenza A by PCR: NEGATIVE
Influenza B by PCR: NEGATIVE
SARS Coronavirus 2 by RT PCR: NEGATIVE

## 2020-10-22 LAB — CBC
HCT: 49.1 % (ref 39.0–52.0)
Hemoglobin: 16.7 g/dL (ref 13.0–17.0)
MCH: 27 pg (ref 26.0–34.0)
MCHC: 34 g/dL (ref 30.0–36.0)
MCV: 79.3 fL — ABNORMAL LOW (ref 80.0–100.0)
Platelets: 205 10*3/uL (ref 150–400)
RBC: 6.19 MIL/uL — ABNORMAL HIGH (ref 4.22–5.81)
RDW: 13.9 % (ref 11.5–15.5)
WBC: 9 10*3/uL (ref 4.0–10.5)
nRBC: 0 % (ref 0.0–0.2)

## 2020-10-22 LAB — BASIC METABOLIC PANEL
Anion gap: 10 (ref 5–15)
BUN: 24 mg/dL — ABNORMAL HIGH (ref 6–20)
CO2: 21 mmol/L — ABNORMAL LOW (ref 22–32)
Calcium: 9.2 mg/dL (ref 8.9–10.3)
Chloride: 107 mmol/L (ref 98–111)
Creatinine, Ser: 1.26 mg/dL — ABNORMAL HIGH (ref 0.61–1.24)
GFR, Estimated: >60 mL/min (ref >60)
Glucose, Bld: 100 mg/dL — ABNORMAL HIGH (ref 70–99)
Potassium: 4.1 mmol/L (ref 3.5–5.1)
Sodium: 138 mmol/L (ref 135–145)

## 2020-10-22 LAB — APTT: aPTT: 27 seconds (ref 24–36)

## 2020-10-22 LAB — TROPONIN I (HIGH SENSITIVITY)
Troponin I (High Sensitivity): 47 ng/L — ABNORMAL HIGH (ref <18)
Troponin I (High Sensitivity): 59 ng/L — ABNORMAL HIGH (ref <18)

## 2020-10-22 LAB — HEPARIN LEVEL (UNFRACTIONATED): Heparin Unfractionated: 0.46 IU/mL (ref 0.30–0.70)

## 2020-10-22 MED ORDER — ASPIRIN 81 MG PO CHEW: 324.0000 mg | CHEWABLE_TABLET | Freq: Once | ORAL | Status: DC

## 2020-10-22 MED ORDER — ALBUTEROL SULFATE HFA 108 (90 BASE) MCG/ACT IN AERS
2.0000 | INHALATION_SPRAY | Freq: Four times a day (QID) | RESPIRATORY_TRACT | Status: DC | PRN
  Filled 2020-10-22: qty 6.7

## 2020-10-22 MED ORDER — VITAMIN D 25 MCG (1000 UNIT) PO TABS
2000.0000 [IU] | ORAL_TABLET | Freq: Every day | ORAL | Status: DC
  Administered 2020-10-23 – 2020-10-25 (×3): 2000 [IU] via ORAL
  Filled 2020-10-22 (×3): qty 2

## 2020-10-22 MED ORDER — ACETAMINOPHEN 650 MG RE SUPP: 325.0000 mg | Freq: Four times a day (QID) | RECTAL | Status: DC | PRN

## 2020-10-22 MED ORDER — LISINOPRIL-HYDROCHLOROTHIAZIDE 20-12.5 MG PO TABS: 2.0000 | ORAL_TABLET | Freq: Every day | ORAL | Status: DC

## 2020-10-22 MED ORDER — ONDANSETRON HCL 4 MG PO TABS: 4.0000 mg | ORAL_TABLET | Freq: Four times a day (QID) | ORAL | Status: DC | PRN

## 2020-10-22 MED ORDER — HEPARIN BOLUS VIA INFUSION
6000.0000 [IU] | Freq: Once | INTRAVENOUS | Status: AC
  Administered 2020-10-22: 6000 [IU] via INTRAVENOUS
  Filled 2020-10-22: qty 6000

## 2020-10-22 MED ORDER — LISINOPRIL 20 MG PO TABS: 40.0000 mg | ORAL_TABLET | Freq: Every day | ORAL | Status: DC

## 2020-10-22 MED ORDER — OMEGA-3-ACID ETHYL ESTERS 1 G PO CAPS
1.0000 g | ORAL_CAPSULE | Freq: Every day | ORAL | Status: DC
  Administered 2020-10-23 – 2020-10-25 (×3): 1 g via ORAL
  Filled 2020-10-22 (×4): qty 1

## 2020-10-22 MED ORDER — ONDANSETRON HCL 4 MG/2ML IJ SOLN: 4.0000 mg | Freq: Four times a day (QID) | INTRAMUSCULAR | Status: DC | PRN

## 2020-10-22 MED ORDER — ACETAMINOPHEN 325 MG PO TABS
325.0000 mg | ORAL_TABLET | Freq: Four times a day (QID) | ORAL | Status: DC | PRN
  Administered 2020-10-24: 325 mg via ORAL
  Filled 2020-10-22: qty 1

## 2020-10-22 MED ORDER — VITAMIN B-12 100 MCG PO TABS
100.0000 ug | ORAL_TABLET | Freq: Every day | ORAL | Status: DC
  Administered 2020-10-23 – 2020-10-25 (×3): 100 ug via ORAL
  Filled 2020-10-22 (×3): qty 1

## 2020-10-22 MED ORDER — PANTOPRAZOLE SODIUM 40 MG PO TBEC
80.0000 mg | DELAYED_RELEASE_TABLET | Freq: Two times a day (BID) | ORAL | Status: DC
  Administered 2020-10-22 – 2020-10-25 (×5): 80 mg via ORAL
  Filled 2020-10-22 (×5): qty 2

## 2020-10-22 MED ORDER — MONTELUKAST SODIUM 10 MG PO TABS
10.0000 mg | ORAL_TABLET | Freq: Every day | ORAL | Status: DC
  Administered 2020-10-22 – 2020-10-24 (×3): 10 mg via ORAL
  Filled 2020-10-22 (×3): qty 1

## 2020-10-22 MED ORDER — SIMVASTATIN 20 MG PO TABS
10.0000 mg | ORAL_TABLET | Freq: Every day | ORAL | Status: DC
  Administered 2020-10-22 – 2020-10-24 (×3): 10 mg via ORAL
  Filled 2020-10-22 (×3): qty 1

## 2020-10-22 MED ORDER — IOHEXOL 350 MG/ML SOLN
75.0000 mL | Freq: Once | INTRAVENOUS | Status: AC | PRN
  Administered 2020-10-22: 75 mL via INTRAVENOUS

## 2020-10-22 MED ORDER — ADULT MULTIVITAMIN W/MINERALS CH
1.0000 | ORAL_TABLET | Freq: Every day | ORAL | Status: DC
  Administered 2020-10-23 – 2020-10-25 (×3): 1 via ORAL
  Filled 2020-10-22 (×3): qty 1

## 2020-10-22 MED ORDER — HEPARIN (PORCINE) 25000 UT/250ML-% IV SOLN
1650.0000 [IU]/h | INTRAVENOUS | Status: AC
  Administered 2020-10-22 – 2020-10-23 (×2): 1650 [IU]/h via INTRAVENOUS
  Filled 2020-10-22 (×3): qty 250

## 2020-10-22 MED ORDER — HYDROCHLOROTHIAZIDE 25 MG PO TABS: 25.0000 mg | ORAL_TABLET | Freq: Every day | ORAL | Status: DC

## 2020-10-22 MED ORDER — FUROSEMIDE 10 MG/ML IJ SOLN
60.0000 mg | Freq: Once | INTRAMUSCULAR | Status: AC
  Administered 2020-10-22: 60 mg via INTRAVENOUS
  Filled 2020-10-22: qty 8

## 2020-10-22 NOTE — Consult Note (Signed)
ANTICOAGULATION CONSULT NOTE  Pharmacy Consult for heparin Indication: pulmonary embolus  Allergies  Allergen Reactions  . Other Itching and Rash    Pt was tested and positive for feline, canine and cockroaches. Pt has rash, itching, watery eyes. And is on allergy injections since 2017    Patient Measurements: Height: 5' 10.5" (179.1 cm) Weight: 127 kg (280 lb) IBW/kg (Calculated) : 74.15 Heparin Dosing Weight: 103 kg  Vital Signs: Temp: 98.3 F (36.8 C) (03/10 1829) Temp Source: Oral (03/10 1647) BP: 131/84 (03/10 1829) Pulse Rate: 100 (03/10 1829)  Labs: Recent Labs    10/22/20 1129 10/22/20 1343 10/22/20 1418 10/22/20 2117  HGB 16.7  --   --   --   HCT 49.1  --   --   --   PLT 205  --   --   --   APTT  --   --  27  --   LABPROT  --   --  13.1  --   INR  --   --  1.0  --   HEPARINUNFRC  --   --   --  0.46  CREATININE 1.26*  --   --   --   TROPONINIHS 47* 59*  --   --     Estimated Creatinine Clearance: 95.6 mL/min (A) (by C-G formula based on SCr of 1.26 mg/dL (H)).   Medical History: Past Medical History:  Diagnosis Date  . Asthma   . Chronic kidney disease   . Complication of anesthesia    HICCUPS FOR 1 WEEK AFTER BICEP SURGERY  . Dizziness   . GERD (gastroesophageal reflux disease)   . HA (headache)   . Hemorrhoid   . High cholesterol   . Hypercholesteremia   . Polycythemia   . Sleep apnea    NO CPAP    Medications:  No PTA APT or AC  Assessment: 50 y.o. male with a past medical history of asthma, CKD, HTN, HLD presents to the emergency department for shortness of breath. CT shows bilateral PE with a large clot burden and findings consistent with right heart strain. Pharmacy has been consulted for heparin dosing. H/H and plts WNL  Heparin Dosing Weight: 103 kg  Goal of Therapy:  Heparin level 0.3-0.7 units/ml Monitor platelets by anticoagulation protocol: Yes   Plan:   3/10 2117 HL 0.46, therapeutic x 1. Continue heparin drip at 1650  units/hr. Recheck HL 3/11 at 0300 to confirm. CBC daily while on heparin drip.  Pricilla Riffle, PharmD 10/22/2020,9:43 PM

## 2020-10-22 NOTE — Assessment & Plan Note (Signed)
Patient in today with 2 week history of chest tightness and sob. No diaphoresis or radiation of pain. He has asthma and says that his SOB decreases somewhat with the Inh. Denies family history of CAD.  Plan- After abnormal ECG and review with Dr Juel Burrow we have activated EMS and given 4 baby Asa. Patient has contacted his spouse to meet her at the ED. At time of d/c from Clinic patient was stable with Resp of 30 BP- 104/76.

## 2020-10-22 NOTE — ED Triage Notes (Signed)
Pt comes into the ED via [facility] from his MD appt where the patient complained of increased SHOB and chest tightness.  Pt states that this has been ongoing x 2 weeks and he has had increased lightheadedness.  Pt has been using home inhaler with no relief.  MD sent patient over with concern he may have a blockage.

## 2020-10-22 NOTE — ED Notes (Signed)
Covid swab performed and sent to lab

## 2020-10-22 NOTE — ED Triage Notes (Signed)
First RN Note: pt to ED via [facility] with c/o SOB worse with exertion, per EMS pt from [facility], per EMS staff at [facility] center and per staff they spoke with cardiologist and staff told EMS pt is having a STEMI, per EMS pt does not meet STEMI criteria and patient denies CP, per EMS pt c/o exertional SOB, per EMS c/o dizziness, SOB, and increasing fatigue. Per EMS pt is currently on 3L and is currently 90% on 3L via [place].   4 ASA given PTA 20g R AC

## 2020-10-22 NOTE — ED Provider Notes (Signed)
[facility] Emergency Department Provider Note  Time seen: 1:36 PM  I have reviewed the triage vital signs and the nursing notes.   HISTORY  Chief Complaint Chest Pain   HPI Christopher Burns is a 50 y.o. male with a past medical history of asthma, CKD, gastric reflux, hypertension, hyperlipidemia presents to the emergency department for shortness of breath.  According to the patient over the past 2 weeks or so patient has been noticing shortness of breath worse with exertion.  States he has been experiencing intermittent chest tightness but denies any chest "pain."  Upon arrival patient requiring 2 to 3 L of oxygen to maintain sats in the low 90s.  No history of pulmonary disease besides asthma, has tried his inhaler with some relief.  Patient went to his PCP today and was referred to the emergency department for further work-up.  Past Medical History:  Diagnosis Date  . Asthma   . Chronic kidney disease   . Complication of anesthesia    HICCUPS FOR 1 WEEK AFTER BICEP SURGERY  . Dizziness   . GERD (gastroesophageal reflux disease)   . HA (headache)   . Hemorrhoid   . High cholesterol   . Hypercholesteremia   . Polycythemia   . Sleep apnea    NO CPAP    Patient Active Problem List   Diagnosis Date Noted  . Chest pain due to myocardial ischemia 10/22/2020  . Mixed hyperlipidemia 05/21/2020  . Hypertension 05/21/2020  . Morbid obesity with alveolar hypoventilation (HCC) 05/21/2020  . Polycythemia 07/29/2019  . High cholesterol   . Dizziness     Past Surgical History:  Procedure Laterality Date  . bisep Left 2007  . COLONOSCOPY WITH PROPOFOL N/A 03/01/2019   Procedure: COLONOSCOPY WITH PROPOFOL;  Surgeon: Wyline Mood, MD;  Location: [facility] ENDOSCOPY;  Service: Gastroenterology;  Laterality: N/A;  . KNEE SURGERY Right   . LASIK    . TONSILLECTOMY    . UMBILICAL HERNIA REPAIR N/A 07/29/2015   Procedure: HERNIA REPAIR UMBILICAL ADULT;  Surgeon:  Kieth Brightly, MD;  Location: [facility] ORS;  Service: General;  Laterality: N/A;  . VASOTOMY      Prior to Admission medications   Medication Sig Start Date End Date Taking? Authorizing Provider  Cholecalciferol (VITAMIN D) 2000 UNITS tablet Take 2,000 Units by mouth daily.    [provider]  EPIPEN 2-PAK 0.3 MG/0.3ML SOAJ injection See admin instructions. 01/08/15   [provider]  esomeprazole (NEXIUM) 40 MG capsule Take 40 mg by mouth daily.     [provider]  lisinopril-hydrochlorothiazide (ZESTORETIC) 20-12.5 MG tablet TAKE 2 TABLETS BY MOUTH EVERY DAY 08/04/20   Corky Downs, MD  montelukast (SINGULAIR) 10 MG tablet Take 10 mg by mouth at bedtime.  06/12/14   [provider]  Multiple Vitamins-Minerals (MULTIVITAMIN WITH MINERALS) tablet Take 1 tablet by mouth daily.    [provider]  omega-3 acid ethyl esters (LOVAZA) 1 G capsule Take 1 g by mouth daily.    [provider]  simvastatin (ZOCOR) 10 MG tablet TAKE 1 TABLET BY MOUTH EVERYDAY AT BEDTIME 07/01/20   Corky Downs, MD  VENTOLIN HFA 108 (90 Base) MCG/ACT inhaler USE 2 PUFFS 4 TIMES A DAY AS NEEDED 07/06/20   Corky Downs, MD    Allergies  Allergen Reactions  . Other Itching and Rash    Pt was tested and positive for feline, canine and cockroaches. Pt has rash, itching, watery eyes. And is on  allergy injections since 2017    Family History  Problem Relation Age of Onset  . Cancer Mother        breast  . Brain cancer Mother   . High Cholesterol Mother   . Diabetes Father   . High blood pressure Father   . High Cholesterol Father   . Prostate cancer Paternal Grandfather   . Cancer Maternal Grandmother   . Cancer Maternal Grandfather     Social History Social History   Tobacco Use  . Smoking status: Never Smoker  . Smokeless tobacco: Never Used  Vaping Use  . Vaping Use: Never used  Substance Use Topics  . Alcohol use: Not Currently  . Drug use:  No    Review of Systems Constitutional: Negative for fever. Cardiovascular: Mild chest tightness. Respiratory: Positive for shortness of breath x2 weeks Gastrointestinal: Negative for abdominal pain, vomiting Musculoskeletal: Negative for musculoskeletal complaints Neurological: Negative for headache All other ROS negative  ____________________________________________   PHYSICAL EXAM:  VITAL SIGNS: ED Triage Vitals  Enc Vitals Group     BP 10/22/20 1132 112/83     Pulse Rate 10/22/20 1132 98     Resp 10/22/20 1132 20     Temp 10/22/20 1133 98.2 F (36.8 C)     Temp Source 10/22/20 1132 Oral     SpO2 10/22/20 1132 96 %     Weight 10/22/20 1126 280 lb (127 kg)     Height 10/22/20 1126 5' 10.5" (1.791 m)     Head Circumference --      Peak Flow --      Pain Score 10/22/20 1125 3     Pain Loc --      Pain Edu? --      Excl. in GC? --     Constitutional: Alert and oriented. Well appearing and in no distress. Eyes: Normal exam ENT      Head: Normocephalic and atraumatic.      Mouth/Throat: Mucous membranes are moist. Cardiovascular: Normal rate, regular rhythm.  Respiratory: Normal respiratory effort without tachypnea nor retractions. Breath sounds are clear  Gastrointestinal: Soft and nontender. No distention.   Musculoskeletal: Nontender with normal range of motion in all extremities. No lower extremity tenderness.  Mild pedal edema bilaterally. Neurologic:  Normal speech and language. No gross focal neurologic deficits  Skin:  Skin is warm, dry and intact.  Psychiatric: Mood and affect are normal.   ____________________________________________    EKG  EKG viewed and interpreted by myself shows a normal sinus rhythm at 99 bpm with a narrow QRS, left axis deviation, largely normal intervals with nonspecific ST changes without ST elevation.  ____________________________________________    RADIOLOGY  Chest x-ray shows cardiomegaly with pulmonary venous  congestion.  ____________________________________________   INITIAL IMPRESSION / ASSESSMENT AND PLAN / ED COURSE  Pertinent labs & imaging results that were available during my care of the patient were reviewed by me and considered in my medical decision making (see chart for details).   Patient presents emergency department for shortness of breath and chest tightness ongoing over the past 2 weeks.  Patient denies any "chest pain."  On examination patient has mild pedal edema.  Patient is hypoxic on room air, satting in the low 90s on 2 to 3 L of nasal cannula oxygen.  Chest x-ray consistent with venous congestion likely causing his shortness of breath and hypoxia.  We will dose IV Lasix.  Patient's troponin is mildly elevated at 47.  Patient states  he does take testosterone pills but no longer takes testosterone injections.  We will obtain a CTA to rule out pulmonary embolism.  No history of DVT previously.  Patient agreeable to plan of care.  Patient will require admission once his emergency department work-up is been completed.  CTA images reviewed by myself appears to be consistent with moderate clot burden bilaterally right greater than left including right main pulmonary artery.  We will order heparin infusion for the patient.  He will require admission to the hospital service.  Spoke to vascular states patient will likely be a candidate for lysis probably tomorrow.  Patient will be admitted to the hospital service.  Christopher Burns was evaluated in Emergency Department on 10/22/2020 for the symptoms described in the history of present illness. He was evaluated in the context of the global COVID-19 pandemic, which necessitated consideration that the patient might be at risk for infection with the SARS-CoV-2 virus that causes COVID-19. Institutional protocols and algorithms that pertain to the evaluation of patients at risk for COVID-19 are in a state of rapid change based on information released  by regulatory bodies including the CDC and federal and state organizations. These policies and algorithms were followed during the patient's care in the ED.  CRITICAL CARE Performed by: Minna Antis   Total critical care time: 45 minutes  Critical care time was exclusive of separately billable procedures and treating other patients.  Critical care was necessary to treat or prevent imminent or life-threatening deterioration.  Critical care was time spent personally by me on the following activities: development of treatment plan with patient and/or surrogate as well as nursing, discussions with consultants, evaluation of patient's response to treatment, examination of patient, obtaining history from patient or surrogate, ordering and performing treatments and interventions, ordering and review of laboratory studies, ordering and review of radiographic studies, pulse oximetry and re-evaluation of patient's condition.  ____________________________________________   FINAL CLINICAL IMPRESSION(S) / ED DIAGNOSES  Dyspnea Chest pain Pulmonary embolism   Minna Antis, MD 10/22/20 1523

## 2020-10-22 NOTE — ED Notes (Signed)
Pt transported via ED Nurse

## 2020-10-22 NOTE — Progress Notes (Signed)
Established Patient Office Visit  SUBJECTIVE:  Subjective  Patient ID: Christopher Burns, male    DOB: 12-Jun-1971  Age: 50 y.o. MRN: [id number]  CC:  Chief Complaint  Patient presents with  . Chest Pain    Patient having chest tightness and feels sob. Patient having to use his inhaler more frequently. Patient also complains of acid reflux and gas/bloating.     HPI Christopher Burns is a 50 y.o. male presenting today for     Past Medical History:  Diagnosis Date  . Asthma   . Chronic kidney disease   . Complication of anesthesia    HICCUPS FOR 1 WEEK AFTER BICEP SURGERY  . Dizziness   . GERD (gastroesophageal reflux disease)   . HA (headache)   . Hemorrhoid   . High cholesterol   . Hypercholesteremia   . Polycythemia   . Sleep apnea    NO CPAP    Past Surgical History:  Procedure Laterality Date  . bisep Left 2007  . COLONOSCOPY WITH PROPOFOL N/A 03/01/2019   Procedure: COLONOSCOPY WITH PROPOFOL;  Surgeon: Wyline Mood, MD;  Location: [facility] ENDOSCOPY;  Service: Gastroenterology;  Laterality: N/A;  . KNEE SURGERY Right   . LASIK    . TONSILLECTOMY    . UMBILICAL HERNIA REPAIR N/A 07/29/2015   Procedure: HERNIA REPAIR UMBILICAL ADULT;  Surgeon: Kieth Brightly, MD;  Location: [facility] ORS;  Service: General;  Laterality: N/A;  . VASOTOMY      Family History  Problem Relation Age of Onset  . Cancer Mother        breast  . Brain cancer Mother   . High Cholesterol Mother   . Diabetes Father   . High blood pressure Father   . High Cholesterol Father   . Prostate cancer Paternal Grandfather   . Cancer Maternal Grandmother   . Cancer Maternal Grandfather     Social History   Socioeconomic History  . Marital status: Married    Spouse name: [name]  . Number of children: 1  . Years of education: 20  . Highest education level: Not on file  Occupational History    Comment: [employer]  Tobacco Use  . Smoking status: Never Smoker  . Smokeless tobacco:  Never Used  Vaping Use  . Vaping Use: Never used  Substance and Sexual Activity  . Alcohol use: Not Currently  . Drug use: No  . Sexual activity: Yes  Other Topics Concern  . Not on file  Social History Narrative   Patient lives at home  with his wife [name]).  Patient works full time for [employer].   Education some college.   Right handed.   Caffeine one shot of colombian  coffee.   Social Determinants of Health   Financial Resource Strain: Not on file  Food Insecurity: Not on file  Transportation Needs: Not on file  Physical Activity: Not on file  Stress: Not on file  Social Connections: Not on file  Intimate Partner Violence: Not on file     Current Outpatient Medications:  .  Cholecalciferol (VITAMIN D) 2000 UNITS tablet, Take 2,000 Units by mouth daily., Disp: , Rfl:  .  EPIPEN 2-PAK 0.3 MG/0.3ML SOAJ injection, See admin instructions., Disp: , Rfl: 1 .  esomeprazole (NEXIUM) 40 MG capsule, Take 40 mg by mouth daily. , Disp: , Rfl:  .  lisinopril-hydrochlorothiazide (ZESTORETIC) 20-12.5 MG tablet, TAKE 2 TABLETS BY MOUTH EVERY DAY, Disp: 60 tablet, Rfl: 14 .  montelukast (SINGULAIR) 10 MG tablet, Take 10 mg by mouth at bedtime. , Disp: , Rfl: 11 .  Multiple Vitamins-Minerals (MULTIVITAMIN WITH MINERALS) tablet, Take 1 tablet by mouth daily., Disp: , Rfl:  .  omega-3 acid ethyl esters (LOVAZA) 1 G capsule, Take 1 g by mouth daily., Disp: , Rfl:  .  simvastatin (ZOCOR) 10 MG tablet, TAKE 1 TABLET BY MOUTH EVERYDAY AT BEDTIME, Disp: 30 tablet, Rfl: 3 .  VENTOLIN HFA 108 (90 Base) MCG/ACT inhaler, USE 2 PUFFS 4 TIMES A DAY AS NEEDED, Disp: 3 each, Rfl: 3   Allergies  Allergen Reactions  . Other Itching and Rash    Pt was tested and positive for feline, canine and cockroaches. Pt has rash, itching, watery eyes. And is on allergy injections since 2017    ROS Review of Systems  Constitutional: Negative.   HENT: Negative.   Respiratory: Positive for chest  tightness and shortness of breath.   Cardiovascular: Positive for chest pain.  Genitourinary: Negative.   Neurological: Negative.   Psychiatric/Behavioral: Negative.      OBJECTIVE:    Physical Exam Vitals and nursing note reviewed.  HENT:     Head: Normocephalic.  Cardiovascular:     Rate and Rhythm: Normal rate and regular rhythm.  Pulmonary:     Effort: Tachypnea present.     Breath sounds: Normal breath sounds.  Musculoskeletal:        General: Normal range of motion.  Skin:    General: Skin is warm.  Neurological:     Mental Status: He is alert.     BP 116/77   Pulse (!) 104   Resp (!) 30   Ht 5\' 11"  (1.803 m)   Wt 289 lb 1.6 oz (131.1 kg)   SpO2 98%   BMI 40.32 kg/m  Wt Readings from Last 3 Encounters:  10/22/20 289 lb 1.6 oz (131.1 kg)  05/21/20 281 lb (127.5 kg)  05/04/20 285 lb (129.3 kg)    Health Maintenance Due  Topic Date Due  . Hepatitis C Screening  Never done  . COVID-19 Vaccine (1) Never done  . HIV Screening  Never done  . TETANUS/TDAP  Never done  . INFLUENZA VACCINE  Never done    There are no preventive care reminders to display for this patient.  CBC Latest Ref Rng & Units 12/24/2019 11/04/2019 09/24/2019  WBC 4.0 - 10.5 K/uL 7.5 8.0 8.1  Hemoglobin 13.0 - 17.0 g/dL 11/22/2019 17.5 10.2  Hematocrit 39.0 - 52.0 % 42.2 43.0 46.4  Platelets 150 - 400 K/uL 258 263 248   CMP Latest Ref Rng & Units 07/29/2019 07/10/2014  Glucose 70 - 99 mg/dL 07/12/2014) 277(O)  BUN 6 - 20 mg/dL 19 17  Creatinine 242(P - 1.24 mg/dL 5.36) 1.44(R  Sodium 1.54 - 145 mmol/L 138 139  Potassium 3.5 - 5.1 mmol/L 4.3 4.3  Chloride 98 - 111 mmol/L 100 102  CO2 22 - 32 mmol/L 29 23  Calcium 8.9 - 10.3 mg/dL 9.5 9.6  Total Protein 6.5 - 8.1 g/dL 7.5 7.2  Total Bilirubin 0.3 - 1.2 mg/dL 0.6 0.3  Alkaline Phos 38 - 126 U/L 37(L) 39  AST 15 - 41 U/L 33 35  ALT 0 - 44 U/L 41 47    Lab Results  Component Value Date   TSH 2.720 07/21/2014   Lab Results  Component Value  Date   ALBUMIN 4.3 07/29/2019   ANIONGAP 9 07/29/2019   No results found for: CHOL,  HDL, LDLCALC, CHOLHDL No results found for: TRIG No results found for: HGBA1C    ASSESSMENT & PLAN:   Problem List Items Addressed This Visit      Other   Chest pain due to myocardial ischemia - Primary    Patient in today with 2 week history of chest tightness and sob. No diaphoresis or radiation of pain. He has asthma and says that his SOB decreases somewhat with the Inh. Denies family history of CAD.  Plan- After abnormal ECG and review with Dr Juel Burrow we have activated EMS and given 4 baby Asa. Patient has contacted his spouse to meet her at the ED. At time of d/c from Clinic patient was stable with Resp of 30 BP- 104/76.          No orders of the defined types were placed in this encounter.     Follow-up: No follow-ups on file.    Irish Lack, FNP [facility] [address], [place], [place] [address]

## 2020-10-22 NOTE — ED Notes (Signed)
Patient transported to Ultrasound 

## 2020-10-22 NOTE — ED Notes (Signed)
Pharmacy at bedside

## 2020-10-22 NOTE — ED Notes (Signed)
Ultrasound still at bedside   ED Nurse at Bedside attempting to IV

## 2020-10-22 NOTE — ED Notes (Addendum)
2 failed IV attempts. ?

## 2020-10-22 NOTE — Consult Note (Signed)
ANTICOAGULATION CONSULT NOTE  Pharmacy Consult for heparin Indication: pulmonary embolus  Allergies  Allergen Reactions  . Other Itching and Rash    Pt was tested and positive for feline, canine and cockroaches. Pt has rash, itching, watery eyes. And is on allergy injections since 2017    Patient Measurements: Height: 5' 10.5" (179.1 cm) Weight: 127 kg (280 lb) IBW/kg (Calculated) : 74.15 Heparin Dosing Weight: 103 kg  Vital Signs: Temp: 98.2 F (36.8 C) (03/10 1133) Temp Source: Oral (03/10 1133) BP: 112/83 (03/10 1132) Pulse Rate: 91 (03/10 1245)  Labs: Recent Labs    10/22/20 1129  HGB 16.7  HCT 49.1  PLT 205  CREATININE 1.26*  TROPONINIHS 47*    Estimated Creatinine Clearance: 95.6 mL/min (A) (by C-G formula based on SCr of 1.26 mg/dL (H)).   Medical History: Past Medical History:  Diagnosis Date  . Asthma   . Chronic kidney disease   . Complication of anesthesia    HICCUPS FOR 1 WEEK AFTER BICEP SURGERY  . Dizziness   . GERD (gastroesophageal reflux disease)   . HA (headache)   . Hemorrhoid   . High cholesterol   . Hypercholesteremia   . Polycythemia   . Sleep apnea    NO CPAP    Medications:  No PTA APT or AC  Assessment: 50 y.o. male with a past medical history of asthma, CKD, HTN, HLD presents to the emergency department for shortness of breath. CT shows bilateral PE with a large clot burden and findings consistent with right heart strain. Pharmacy has been consulted for heparin dosing. H/H and plts WNL  Heparin Dosing Weight: 103 kg  Goal of Therapy:  Heparin level 0.3-0.7 units/ml Monitor platelets by anticoagulation protocol: Yes   Plan:   Give 6000 units bolus x 1 Start heparin infusion at 1650 units/hr Check anti-Xa level in 6 hours and daily while on heparin Continue to monitor H&H and platelets  Derrek Gu, PharmD 10/22/2020,2:08 PM

## 2020-10-22 NOTE — H&P (Signed)
History and Physical   Christopher NetSamuel Januszewski ZOX:[id number] DOB: Aug 14, 1971 DOA: 10/22/2020  PCP: Corky DownsMasoud, Javed, MD  Outpatient Specialists: Dr. Smith Robertao, oncology Patient coming from: PCP clinic  I have personally briefly reviewed patient's old medical [facility].  Chief Concern: Chest tightness and shortness of breath  HPI: Christopher Burns is a 50 y.o. male with medical history significant for secondary polycythemia, history of testosterone replacement therapy, hypercholesteremia, obstructive sleep apnea on CPAP, hypogonad on testosterone replacement therapy, hypertension, GERD, nonbleeding external hemorrhoids, presented to the emergency department via primary PCP medical clinic for chief concerns of chest tightness and shortness of breath.  He was sent to the ED by his PCP for further evaluation.  He reports the chest tightness started about 2 weeks ago while he was walking.  He endorses that he use his albuterol inhaler with improvement.  He further endorses that with rest the chest tightness improved.  He endorses associated shortness of breath.  He endorses compliance with CPAP machine nightly.  He states that he has never felt this way before.  He is not able to localize the chest tightness to one specific area in his chest and states that it is bilateral parasternal.  He denies fever, chills, nausea, vomiting, dysuria, diarrhea.  He states that he does infrequently have some bright red blood in his stool however he has been evaluated for this in the past and was told that he had internal hemorrhoids.  Social history: He lives with his wife. He denies tobacco, EtOH, recreational drug use.  He works as a Emergency planning/management officerpolice officer.  Vaccinations: covid, has had two doses of covid vaccine with the second one in March 2021  ROS: Constitutional: no weight change, no fever ENT/Mouth: no sore throat, no rhinorrhea Eyes: no eye pain, no vision changes Cardiovascular: no chest pain, + dyspnea,  no  edema, no palpitations, + chest tightness Respiratory: no cough, no sputum, no wheezing Gastrointestinal: no nausea, no vomiting, no diarrhea, no constipation Genitourinary: no urinary incontinence, no dysuria, no hematuria Musculoskeletal: no arthralgias, no myalgias Skin: no skin lesions, no pruritus, Neuro: no weakness, no loss of consciousness, no syncope Psych: no anxiety, no depression, no decrease appetite Heme/Lymph: no bruising, no bleeding  ED Course: Discussed with ED provider, patient required hospitalization due to bilateral pulmonary embolism with right heart strain.  Vitals in the ED was remarkable for afebrile 98.2, respiration rate of 24, heart rate 95, blood pressure 121/75, satting at 92% on 3 L nasal cannula.  Labs in the ED was markable for serum creatinine of 1.26 and previous serum creatinine 1 year ago was 1.40.  Serum sodium is 138, potassium 4.1, chloride 107, bicarb 21, BUN 24, nonfasting glucose 100, troponin was initially 47 and increased to 59.  WBC was not elevated.  Hemoglobin 16, platelets 201.  CTA of the chest revealed bilateral pulmonary embolism.  ED provider started patient on heparin GTT, and consulted vascular, Dr. Wyn Quakerew.  EDP also ordered bilateral lower extremity DVT ultrasound.  Assessment/Plan  Principal Problem:   Pulmonary embolism (HCC) Active Problems:   Polycythemia   Mixed hyperlipidemia   Hypertension   Hypogonadism in male   # Bilateral pulmonary embolism -Patient reports that formally he received testosterone injections however has discontinued this about 1 year ago when he was diagnosed with with polycythemia secondary to the testosterone injections -Patient states that he takes over-the-counter supplements marketed as over-the-counter testosterone medication, tribulus terrestris 8650 mg daily -Bilateral ultrasound of the lower extremity was  read as nonocclusive partially compressive thrombus in the right popliteal vein -Vascular, Dr.  Wyn Quaker has been consulted and will see the patient -Complete cardiac echo has been ordered to assess for cardiac function  # Obstructive sleep apnea on CPAP -Epworth sleepiness score is 6 out of 24 -Per pulmonology note, compliance is average of 7 hours, -AHI is 2.1 -CPAP nightly ordered  # Hypertension-controlled -Patient takes his lisinopril-hydrochlorothiazide 20-12 0.5 nightly  # History of COVID-19 positive result, 09/10/2019  # Covid test PCR was negative   Chart reviewed.   Colonoscopy on 03/01/2019 revealed nonbleeding external hemorrhoids otherwise normal exam  DVT prophylaxis: Heparin GTT Code Status: Full code Diet: N.p.o. pending vascular evaluation Family Communication: Updated spouse at bedside Disposition Plan: Pending clinical course Consults called: Vascular Admission status: Observation to progressive cardiac  Past Medical History:  Diagnosis Date  . Asthma   . Chronic kidney disease   . Complication of anesthesia    HICCUPS FOR 1 WEEK AFTER BICEP SURGERY  . Dizziness   . GERD (gastroesophageal reflux disease)   . HA (headache)   . Hemorrhoid   . High cholesterol   . Hypercholesteremia   . Polycythemia   . Sleep apnea    NO CPAP   Past Surgical History:  Procedure Laterality Date  . bisep Left 2007  . COLONOSCOPY WITH PROPOFOL N/A 03/01/2019   Procedure: COLONOSCOPY WITH PROPOFOL;  Surgeon: Wyline Mood, MD;  Location: [facility] ENDOSCOPY;  Service: Gastroenterology;  Laterality: N/A;  . KNEE SURGERY Right   . LASIK    . TONSILLECTOMY    . UMBILICAL HERNIA REPAIR N/A 07/29/2015   Procedure: HERNIA REPAIR UMBILICAL ADULT;  Surgeon: Kieth Brightly, MD;  Location: [facility] ORS;  Service: General;  Laterality: N/A;  . VASOTOMY     Social History:  reports that he has never smoked. He has never used smokeless tobacco. He reports previous alcohol use. He reports that he does not use drugs.  Allergies  Allergen Reactions  . Other Itching and Rash    Pt  was tested and positive for feline, canine and cockroaches. Pt has rash, itching, watery eyes. And is on allergy injections since 2017   Family History  Problem Relation Age of Onset  . Cancer Mother        breast  . Brain cancer Mother   . High Cholesterol Mother   . Diabetes Father   . High blood pressure Father   . High Cholesterol Father   . Prostate cancer Paternal Grandfather   . Cancer Maternal Grandmother   . Cancer Maternal Grandfather    Family history: Family history reviewed and not pertinent  Prior to Admission medications   Medication Sig Start Date End Date Taking? Authorizing Provider  Cholecalciferol (VITAMIN D) 2000 UNITS tablet Take 2,000 Units by mouth daily.    [provider]  EPIPEN 2-PAK 0.3 MG/0.3ML SOAJ injection See admin instructions. 01/08/15   [provider]  esomeprazole (NEXIUM) 40 MG capsule Take 40 mg by mouth daily.     [provider]  lisinopril-hydrochlorothiazide (ZESTORETIC) 20-12.5 MG tablet TAKE 2 TABLETS BY MOUTH EVERY DAY 08/04/20   Corky Downs, MD  montelukast (SINGULAIR) 10 MG tablet Take 10 mg by mouth at bedtime.  06/12/14   [provider]  Multiple Vitamins-Minerals (MULTIVITAMIN WITH MINERALS) tablet Take 1 tablet by mouth daily.    [provider]  omega-3 acid ethyl esters (LOVAZA) 1 G capsule Take 1 g by mouth daily.  [provider]  simvastatin (ZOCOR) 10 MG tablet TAKE 1 TABLET BY MOUTH EVERYDAY AT BEDTIME 07/01/20   Corky Downs, MD  VENTOLIN HFA 108 (90 Base) MCG/ACT inhaler USE 2 PUFFS 4 TIMES A DAY AS NEEDED 07/06/20   Corky Downs, MD   Physical Exam: Vitals:   10/22/20 1132 10/22/20 1133 10/22/20 1245 10/22/20 1647  BP: 112/83   128/87  Pulse: 98  91 96  Resp: 20  16 18   Temp:  98.2 F (36.8 C)  98.4 F (36.9 C)  TempSrc: Oral Oral  Oral  SpO2: 96%  92% 92%  Weight:      Height:       Constitutional: appears age appropriate, NAD, calm,  comfortable Eyes: PERRL, lids and conjunctivae normal ENMT: Mucous membranes are moist. Posterior pharynx clear of any exudate or lesions. Age-appropriate dentition. Hearing appropriate Neck: normal, supple, no masses, no thyromegaly Respiratory: clear to auscultation bilaterally, no wheezing, no crackles. Normal respiratory effort. No accessory muscle use.  Cardiovascular: Regular rate and rhythm, no murmurs / rubs / gallops. No extremity edema. 2+ pedal pulses. No carotid bruits.  Abdomen: Obese abdomen, no tenderness, no masses palpated, no hepatosplenomegaly. Bowel sounds positive.  Musculoskeletal: no clubbing / cyanosis. No joint deformity upper and lower extremities. Good ROM, no contractures, no atrophy. Normal muscle tone.  Skin: no rashes, lesions, ulcers. No induration Neurologic: Sensation intact. Strength 5/5 in all 4.  Psychiatric: Normal judgment and insight. Alert and oriented x 3. Normal mood.   EKG: independently reviewed, showing normal sinus rhythm with rate of 99, QTc 469  Chest x-ray on Admission: I personally reviewed and I agree with radiologist reading as below.  DG Chest 2 View  Result Date: 10/22/2020 CLINICAL DATA:  Chest tightness.  Shortness of breath. EXAM: CHEST - 2 VIEW COMPARISON:  01/04/2011. FINDINGS: Borderline cardiomegaly and pulmonary venous congestion. Low lung volumes. Mild infiltrate left upper lung cannot be excluded. No pleural effusion or pneumothorax. No acute bony abnormality. IMPRESSION: 1.  Borderline cardiomegaly and pulmonary venous congestion. 2. Low lung volumes. Mild infiltrate left upper lung cannot be excluded. Electronically Signed   By: 01/06/2011  Register   On: 10/22/2020 12:32   CT Angio Chest PE W and/or Wo Contrast  Result Date: 10/22/2020 CLINICAL DATA:  Shortness of breath and chest tightness for 2 days. EXAM: CT ANGIOGRAPHY CHEST WITH CONTRAST TECHNIQUE: Multidetector CT imaging of the chest was performed using the standard protocol  during bolus administration of intravenous contrast. Multiplanar CT image reconstructions and MIPs were obtained to evaluate the vascular anatomy. CONTRAST:  75 mL OMNIPAQUE IOHEXOL 350 MG/ML SOLN COMPARISON:  PA and lateral chest 10/22/2020. FINDINGS: Cardiovascular: A large embolus is seen in the right main pulmonary artery. Emboli are also seen in branches of the left main pulmonary artery. The RV to LV ratio is 1.27 consistent with right heart strain. There is bowing of the interventricular septum into the left ventricle. Heart size is normal. No pericardial effusion. Mediastinum/Nodes: No enlarged mediastinal, hilar, or axillary lymph nodes. Thyroid gland, trachea, and esophagus demonstrate no significant findings. Lungs/Pleura: No pleural effusion. There is ground-glass attenuating airspace disease in the left upper lobe. The lungs are otherwise clear. Upper Abdomen: Marked fatty infiltration of the liver. Otherwise negative. Musculoskeletal: No acute or focal abnormality. Review of the MIP images confirms the above findings. IMPRESSION: The examination is positive for bilateral pulmonary emboli with a large clot burden and findings consistent with right heart strain. Hazy airspace opacity in left  upper lobe is most worrisome for pneumonia rather than pulmonary infarct. Critical Value/emergent results were called by telephone at the time of interpretation on 10/22/2020 at 2:08 pm to provider [facility] , who verbally acknowledged these results. Electronically Signed   By: Drusilla Kanner M.D.   On: 10/22/2020 14:09   US Venous Img Lower Bilateral  Result Date: 10/22/2020 CLINICAL DATA:  Left calf pain and cramping. EXAM: Right LOWER EXTREMITY VENOUS DOPPLER ULTRASOUND TECHNIQUE: Gray-scale sonography with graded compression, as well as color Doppler and duplex ultrasound were performed to evaluate the lower extremity deep venous systems from the level of the common femoral vein and including the  common femoral, femoral, profunda femoral, popliteal and calf veins including the posterior tibial, peroneal and gastrocnemius veins when visible. The superficial great saphenous vein was also interrogated. Spectral Doppler was utilized to evaluate flow at rest and with distal augmentation maneuvers in the common femoral, femoral and popliteal veins. COMPARISON:  None. FINDINGS: Contralateral Common Femoral Vein: Respiratory phasicity is normal and symmetric with the symptomatic side. No evidence of thrombus. Normal compressibility. Common Femoral Vein: No evidence of thrombus. Normal compressibility, respiratory phasicity and response to augmentation. Saphenofemoral Junction: No evidence of thrombus. Normal compressibility and flow on color Doppler imaging. Profunda Femoral Vein: No evidence of thrombus. Normal compressibility and flow on color Doppler imaging. Femoral Vein: No evidence of thrombus. Normal compressibility, respiratory phasicity and response to augmentation. Popliteal Vein: There is nonocclusive partially compressible thrombus in the popliteal vein. Calf Veins: No evidence of thrombus. Normal compressibility and flow on color Doppler imaging. Superficial Great Saphenous Vein: No evidence of thrombus. Normal compressibility. Venous Reflux:  None. Other Findings:  None. IMPRESSION: Nonocclusive partially compressive thrombus in the right popliteal vein. These results will be called to the ordering clinician or representative by the Radiologist Assistant, and communication documented in the PACS or [employer]. Electronically Signed   By: Rudie Meyer M.D.   On: 10/22/2020 15:04   Labs on Admission: I have personally reviewed following labs  CBC: Recent Labs  Lab 10/22/20 1129  WBC 9.0  HGB 16.7  HCT 49.1  MCV 79.3*  PLT 205   Basic Metabolic Panel: Recent Labs  Lab 10/22/20 1129  NA 138  K 4.1  CL 107  CO2 21*  GLUCOSE 100*  BUN 24*  CREATININE 1.26*  CALCIUM 9.2    GFR: Estimated Creatinine Clearance: 95.6 mL/min (A) (by C-G formula based on SCr of 1.26 mg/dL (H)).  Urine analysis:    Component Value Date/Time   COLORURINE YELLOW (A) 07/29/2019 1041   APPEARANCEUR CLEAR (A) 07/29/2019 1041   LABSPEC 1.016 07/29/2019 1041   PHURINE 6.0 07/29/2019 1041   GLUCOSEU NEGATIVE 07/29/2019 1041   HGBUR NEGATIVE 07/29/2019 1041   BILIRUBINUR NEGATIVE 07/29/2019 1041   KETONESUR NEGATIVE 07/29/2019 1041   PROTEINUR NEGATIVE 07/29/2019 1041   NITRITE NEGATIVE 07/29/2019 1041   LEUKOCYTESUR NEGATIVE 07/29/2019 1041   Srijan Givan N Bernd Crom D.O. [facility]  If 7PM-7AM, please contact overnight-coverage provider If 7AM-7PM, please contact day coverage provider [email or web address]  10/22/2020, 5:44 PM

## 2020-10-23 ENCOUNTER — Encounter
Admission: EM | Disposition: A | Discharge status: Home / Self Care | Payer: Self-pay | Source: Other Acute Inpatient Hospital | Attending: Internal Medicine

## 2020-10-23 ENCOUNTER — Other Ambulatory Visit (INDEPENDENT_AMBULATORY_CARE_PROVIDER_SITE_OTHER): Payer: Self-pay | Admitting: Vascular Surgery

## 2020-10-23 ENCOUNTER — Inpatient Hospital Stay (HOSPITAL_COMMUNITY)
Admit: 2020-10-23 | Discharge: 2020-10-23 | Disposition: A | Discharge status: Home / Self Care | Payer: 59 | Attending: Internal Medicine | Admitting: Internal Medicine

## 2020-10-23 ENCOUNTER — Institutional Professional Consult (permissible substitution): Payer: 59 | Admitting: Pulmonary Disease

## 2020-10-23 DIAGNOSIS — N182 Chronic kidney disease, stage 2 (mild): Secondary | ICD-10-CM | POA: Diagnosis present

## 2020-10-23 DIAGNOSIS — D751 Secondary polycythemia: Secondary | ICD-10-CM | POA: Diagnosis present

## 2020-10-23 DIAGNOSIS — Z6839 Body mass index (BMI) 39.0-39.9, adult: Secondary | ICD-10-CM | POA: Diagnosis not present

## 2020-10-23 DIAGNOSIS — Z833 Family history of diabetes mellitus: Secondary | ICD-10-CM | POA: Diagnosis not present

## 2020-10-23 DIAGNOSIS — Z803 Family history of malignant neoplasm of breast: Secondary | ICD-10-CM | POA: Diagnosis not present

## 2020-10-23 DIAGNOSIS — R0600 Dyspnea, unspecified: Secondary | ICD-10-CM

## 2020-10-23 DIAGNOSIS — K219 Gastro-esophageal reflux disease without esophagitis: Secondary | ICD-10-CM | POA: Diagnosis present

## 2020-10-23 DIAGNOSIS — I2699 Other pulmonary embolism without acute cor pulmonale: Secondary | ICD-10-CM

## 2020-10-23 DIAGNOSIS — I2609 Other pulmonary embolism with acute cor pulmonale: Secondary | ICD-10-CM

## 2020-10-23 DIAGNOSIS — I129 Hypertensive chronic kidney disease with stage 1 through stage 4 chronic kidney disease, or unspecified chronic kidney disease: Secondary | ICD-10-CM | POA: Diagnosis present

## 2020-10-23 DIAGNOSIS — J9601 Acute respiratory failure with hypoxia: Secondary | ICD-10-CM | POA: Diagnosis present

## 2020-10-23 DIAGNOSIS — J45909 Unspecified asthma, uncomplicated: Secondary | ICD-10-CM | POA: Diagnosis present

## 2020-10-23 DIAGNOSIS — N179 Acute kidney failure, unspecified: Secondary | ICD-10-CM | POA: Diagnosis present

## 2020-10-23 DIAGNOSIS — Z808 Family history of malignant neoplasm of other organs or systems: Secondary | ICD-10-CM | POA: Diagnosis not present

## 2020-10-23 DIAGNOSIS — E291 Testicular hypofunction: Secondary | ICD-10-CM | POA: Diagnosis present

## 2020-10-23 DIAGNOSIS — Z8042 Family history of malignant neoplasm of prostate: Secondary | ICD-10-CM | POA: Diagnosis not present

## 2020-10-23 DIAGNOSIS — K644 Residual hemorrhoidal skin tags: Secondary | ICD-10-CM | POA: Diagnosis present

## 2020-10-23 DIAGNOSIS — Z83438 Family history of other disorder of lipoprotein metabolism and other lipidemia: Secondary | ICD-10-CM | POA: Diagnosis not present

## 2020-10-23 DIAGNOSIS — Z91048 Other nonmedicinal substance allergy status: Secondary | ICD-10-CM | POA: Diagnosis not present

## 2020-10-23 DIAGNOSIS — T502X5A Adverse effect of carbonic-anhydrase inhibitors, benzothiadiazides and other diuretics, initial encounter: Secondary | ICD-10-CM | POA: Diagnosis present

## 2020-10-23 DIAGNOSIS — I2602 Saddle embolus of pulmonary artery with acute cor pulmonale: Secondary | ICD-10-CM | POA: Diagnosis not present

## 2020-10-23 DIAGNOSIS — E782 Mixed hyperlipidemia: Secondary | ICD-10-CM | POA: Diagnosis present

## 2020-10-23 DIAGNOSIS — I82431 Acute embolism and thrombosis of right popliteal vein: Secondary | ICD-10-CM | POA: Diagnosis present

## 2020-10-23 DIAGNOSIS — K648 Other hemorrhoids: Secondary | ICD-10-CM | POA: Diagnosis present

## 2020-10-23 DIAGNOSIS — Z8616 Personal history of COVID-19: Secondary | ICD-10-CM | POA: Diagnosis not present

## 2020-10-23 DIAGNOSIS — G4733 Obstructive sleep apnea (adult) (pediatric): Secondary | ICD-10-CM | POA: Diagnosis present

## 2020-10-23 HISTORY — PX: PULMONARY THROMBECTOMY: CATH118295

## 2020-10-23 LAB — CBC
HCT: 49.5 % (ref 39.0–52.0)
Hemoglobin: 16.5 g/dL (ref 13.0–17.0)
MCH: 26.7 pg (ref 26.0–34.0)
MCHC: 33.3 g/dL (ref 30.0–36.0)
MCV: 80 fL (ref 80.0–100.0)
Platelets: 221 10*3/uL (ref 150–400)
RBC: 6.19 MIL/uL — ABNORMAL HIGH (ref 4.22–5.81)
RDW: 13.9 % (ref 11.5–15.5)
WBC: 9.8 10*3/uL (ref 4.0–10.5)
nRBC: 0 % (ref 0.0–0.2)

## 2020-10-23 LAB — BASIC METABOLIC PANEL
Anion gap: 11 (ref 5–15)
BUN: 28 mg/dL — ABNORMAL HIGH (ref 6–20)
CO2: 21 mmol/L — ABNORMAL LOW (ref 22–32)
Calcium: 9.4 mg/dL (ref 8.9–10.3)
Chloride: 105 mmol/L (ref 98–111)
Creatinine, Ser: 1.56 mg/dL — ABNORMAL HIGH (ref 0.61–1.24)
GFR, Estimated: 54 mL/min — ABNORMAL LOW (ref >60)
Glucose, Bld: 123 mg/dL — ABNORMAL HIGH (ref 70–99)
Potassium: 4.2 mmol/L (ref 3.5–5.1)
Sodium: 137 mmol/L (ref 135–145)

## 2020-10-23 LAB — ECHOCARDIOGRAM COMPLETE
AR max vel: 2.44 cm2
AV Area VTI: 2.88 cm2
AV Area mean vel: 2.25 cm2
AV Mean grad: 2 mmHg
AV Peak grad: 3.4 mmHg
Ao pk vel: 0.92 m/s
Area-P 1/2: 4.74 cm2
Height: 70.5 in
MV VTI: 2.4 cm2
S' Lateral: 2.2 cm
Weight: 4468.8 oz

## 2020-10-23 LAB — HEPARIN LEVEL (UNFRACTIONATED): Heparin Unfractionated: 0.42 IU/mL (ref 0.30–0.70)

## 2020-10-23 LAB — HIV ANTIBODY (ROUTINE TESTING W REFLEX): HIV Screen 4th Generation wRfx: NONREACTIVE

## 2020-10-23 SURGERY — PULMONARY THROMBECTOMY
Anesthesia: Moderate Sedation | Laterality: Bilateral

## 2020-10-23 MED ORDER — HYDROMORPHONE HCL 1 MG/ML IJ SOLN
1.0000 mg | Freq: Once | INTRAMUSCULAR | Status: DC | PRN
Start: 2020-10-23 — End: 2020-10-25

## 2020-10-23 MED ORDER — MIDAZOLAM HCL 2 MG/ML PO SYRP: 8.0000 mg | ORAL_SOLUTION | Freq: Once | ORAL | Status: DC | PRN

## 2020-10-23 MED ORDER — ONDANSETRON HCL 4 MG/2ML IJ SOLN: 4.0000 mg | Freq: Four times a day (QID) | INTRAMUSCULAR | Status: DC | PRN

## 2020-10-23 MED ORDER — HYDROCHLOROTHIAZIDE 25 MG PO TABS: 25.0000 mg | ORAL_TABLET | Freq: Every day | ORAL | Status: DC

## 2020-10-23 MED ORDER — GUAIFENESIN-DM 100-10 MG/5ML PO SYRP: 5.0000 mL | ORAL_SOLUTION | ORAL | Status: DC | PRN

## 2020-10-23 MED ORDER — HEPARIN SODIUM (PORCINE) 1000 UNIT/ML IJ SOLN
INTRAMUSCULAR | Status: AC
  Filled 2020-10-23: qty 1

## 2020-10-23 MED ORDER — ALTEPLASE 2 MG IJ SOLR
INTRAMUSCULAR | Status: AC
  Filled 2020-10-23: qty 8

## 2020-10-23 MED ORDER — SODIUM CHLORIDE 0.9 % IV SOLN: INTRAVENOUS | Status: DC

## 2020-10-23 MED ORDER — ALTEPLASE 2 MG IJ SOLR: INTRAMUSCULAR | Status: DC | PRN

## 2020-10-23 MED ORDER — DIPHENHYDRAMINE HCL 50 MG/ML IJ SOLN: 50.0000 mg | Freq: Once | INTRAMUSCULAR | Status: DC | PRN

## 2020-10-23 MED ORDER — CEFAZOLIN SODIUM-DEXTROSE 1-4 GM/50ML-% IV SOLN
INTRAVENOUS | Status: AC
  Administered 2020-10-23: 1 g via INTRAVENOUS
  Filled 2020-10-23: qty 50

## 2020-10-23 MED ORDER — IODIXANOL 320 MG/ML IV SOLN
INTRAVENOUS | Status: DC | PRN
  Administered 2020-10-23: 45 mL

## 2020-10-23 MED ORDER — FENTANYL CITRATE (PF) 100 MCG/2ML IJ SOLN
INTRAMUSCULAR | Status: DC | PRN
  Administered 2020-10-23: 50 ug via INTRAVENOUS

## 2020-10-23 MED ORDER — LISINOPRIL 20 MG PO TABS: 40.0000 mg | ORAL_TABLET | Freq: Every day | ORAL | Status: DC

## 2020-10-23 MED ORDER — FENTANYL CITRATE (PF) 100 MCG/2ML IJ SOLN
INTRAMUSCULAR | Status: AC
  Filled 2020-10-23: qty 2

## 2020-10-23 MED ORDER — FAMOTIDINE 20 MG PO TABS: 40.0000 mg | ORAL_TABLET | Freq: Once | ORAL | Status: DC | PRN

## 2020-10-23 MED ORDER — PERFLUTREN LIPID MICROSPHERE
1.0000 mL | INTRAVENOUS | Status: AC | PRN
  Administered 2020-10-23: 3 mL via INTRAVENOUS
  Filled 2020-10-23: qty 10

## 2020-10-23 MED ORDER — MIDAZOLAM HCL 2 MG/2ML IJ SOLN
INTRAMUSCULAR | Status: DC | PRN
  Administered 2020-10-23: 2 mg via INTRAVENOUS

## 2020-10-23 MED ORDER — CEFAZOLIN SODIUM-DEXTROSE 1-4 GM/50ML-% IV SOLN
1.0000 g | Freq: Once | INTRAVENOUS | Status: AC
  Filled 2020-10-23: qty 50

## 2020-10-23 MED ORDER — MIDAZOLAM HCL 5 MG/5ML IJ SOLN
INTRAMUSCULAR | Status: AC
  Filled 2020-10-23: qty 5

## 2020-10-23 MED ORDER — METHYLPREDNISOLONE SODIUM SUCC 125 MG IJ SOLR: 125.0000 mg | Freq: Once | INTRAMUSCULAR | Status: DC | PRN

## 2020-10-23 SURGICAL SUPPLY — 12 items
CANISTER PENUMBRA ENGINE (MISCELLANEOUS) ×1 IMPLANT
CATH ANGIO 5F PIGTAIL 100CM (CATHETERS) ×1 IMPLANT
CATH INDIGO SEP 8 (CATHETERS) ×1 IMPLANT
CATH INFINITI JR4 5F (CATHETERS) ×1 IMPLANT
CATH LIGHTNING 8 XTORQ 115 (CATHETERS) ×1 IMPLANT
DEVICE SAFEGUARD 24CM (GAUZE/BANDAGES/DRESSINGS) ×1 IMPLANT
DEVICE TORQUE (MISCELLANEOUS) ×1 IMPLANT
GLIDEWIRE ADV .035X260CM (WIRE) ×1 IMPLANT
PACK ANGIOGRAPHY (CUSTOM PROCEDURE TRAY) ×2 IMPLANT
SHEATH PINNACLE 11FRX10 (SHEATH) ×1 IMPLANT
TUBING CONTRAST HIGH PRESS 72 (TUBING) ×1 IMPLANT
WIRE GUIDERIGHT .035X150 (WIRE) ×2 IMPLANT

## 2020-10-23 NOTE — Op Note (Signed)
[facility]  Percutaneous Study/Intervention Procedural Note   Date of Surgery: 10/23/2020,3:46 PM  Surgeon: Festus Barren  Pre-operative Diagnosis: Symptomatic bilateral pulmonary emboli  Post-operative diagnosis:  Same  Procedure(s) Performed:  1.  Contrast injection right heart  2.  Thrombolysis with 5 mg of TPA in the right pulmonary arteries and 3 mg TPA in the left pulmonary arteries  3.  Mechanical thrombectomy left lower lobe and left upper lobe pulmonary arteries with the penumbra CAT 8 catheter.  Mechanical thrombectomy to the right lower lobe, right middle lobe, and right upper lobe pulmonary arteries with the penumbra CAT 8 catheter  4.  Selective catheter placement right lower lobe, middle lobe, and upper lobe pulmonary artery  5.  Selective catheter placement left lower lobe and upper lobe pulmonary arteries    Anesthesia: Conscious sedation was administered under my direct supervision by the interventional radiology RN. IV Versed plus fentanyl were utilized. Continuous ECG, pulse oximetry and blood pressure was monitored throughout the entire procedure.  Versed and fentanyl were administered intravenously.  Conscious sedation was administered for a total of 35 minutes using 2 of Versed and 50 mcg of Fentanyl.  EBL: 450 cc  Sheath: 11 French right femoral vein  Contrast: 45 cc   Fluoroscopy Time: 13.6 minutes  Indications:  Patient presents with pulmonary emboli. The patient is symptomatic with hypoxemia and dyspnea on exertion.  There is evidence of right heart strain on the CT angiogram. The patient is otherwise a good candidate for intervention and even the long-term benefits pulmonary angiography with thrombolysis is offered. The risks and benefits are reviewed long-term benefits are discussed. All questions are answered patient agrees to proceed.  Procedure:  Christopher Burns a 50 y.o. male who was identified and appropriate procedural time out  was performed.  The patient was then placed supine on the table and prepped and draped in the usual sterile fashion.  Ultrasound was used to evaluate the right common femoral vein.  It was patent, as it was echolucent and compressible.  A digital ultrasound image was acquired for the permanent record.  A Seldinger needle was used to access the right common femoral vein under direct ultrasound guidance.  A 0.035 J wire was advanced without resistance and a 5Fr sheath was placed and then upsized to an 11 [place] sheath.    The wire and pigtail catheter were then negotiated into the right atrium and bolus injection of contrast was utilized to demonstrate the right ventricle and the pulmonary artery outflow. The wire and catheter were then negotiated into the main pulmonary artery where hand injection of contrast was utilized to demonstrate the pulmonary arteries and confirm the locations of the pulmonary emboli.  Image quality is fairly poor, and I initially advanced to the left lower lobe pulmonary artery performed imaging showing a moderate to large amount of thrombus.  I then advanced the catheter to the left upper lobe pulmonary artery and a moderate amount of thrombus was seen in the left upper lobe pulmonary artery as well.  I then went to the right side with a JR4 catheter and the advantage wire.  The right lower lobe was cannulated first and imaging showed a moderate amount of thrombus in the right lower lobe pulmonary artery.  The right middle lobe was then cannulated and selective imaging showed a large amount of thrombus in the right middle lobe pulmonary artery.  JR4 catheter was then used to cannulate the right upper lobe pulmonary artery  and selective imaging showed a moderate amount of thrombus in the right upper lobe pulmonary artery  TPA was reconstituted and delivered onto the table. A total of 8 milligrams of TPA was utilized.  3 mg was administered on the left side and 5 mg was administered on  the right side. This was then allowed to dwell.  The Penumbra Cat 8 catheter was then advanced up into the pulmonary vasculature. The left lung was addressed first. Catheter was negotiated into the left lower lobe pulmonary artery and mechanical thrombectomy was performed with the penumbra CAT 8 catheter using both the catheter alone as well as with the separator. Follow-up imaging demonstrated a good result and therefore the catheter was renegotiated into the left upper lobe pulmonary artery and again mechanical thrombectomy was performed. Passes were made with both the Penumbra catheter itself as well as introducing the separator. Follow-up imaging was then performed.  There is a small amount of residual thrombus in the left lower lobe and a small amount of residual thrombus in the left upper lobe.  The Penumbra Cat 8 catheter was then negotiated to the opposite side. The right lung was then addressed. Catheter was negotiated into the right lower lobe pulmonary artery and mechanical thrombectomy was performed using the penumbra CAT 8 catheter in the separator. Follow-up imaging demonstrated a good result and therefore the catheter was renegotiated into the right middle lobe pulmonary artery and again mechanical thrombectomy was performed with the separator and the penumbra CAT 8 catheter.  Catheter was then redirected into the right upper lobe pulmonary artery using the advantage wire and mechanical thrombectomy is performed with the penumbra CAT 8 catheter in the right upper lobe pulmonary artery. Passes were made with both the Penumbra catheter itself as well as introducing the separator. Follow-up imaging was then performed.  Following this there was a small to medium amount of thrombus in the right upper lobe pulmonary artery and only a small amount of residual thrombus in the right lower lobe pulmonary artery and right middle lobe pulmonary artery.  After review these images wires were reintroduced and  the catheters removed. Then, the sheath is then pulled and pressures held. A safeguard is placed.    Findings:   Right heart imaging:  Right atrium and right ventricle and the pulmonary outflow tract appears quite dilated  Right lung: Clot was present in the right lower lobe pulmonary artery, right middle lobe pulmonary artery, and right upper lobe pulmonary artery and at least moderate amounts in all 3 lobes as well as the distal main right pulmonary artery.  Left lung: A large to medium amount of thrombus present the left lower lobe pulmonary artery and a medium amount of thrombus is present in the left upper lobe pulmonary artery.    Disposition: Patient was taken to the recovery room in stable condition having tolerated the procedure well.  Festus Barren 10/23/2020,3:46 PM

## 2020-10-23 NOTE — Progress Notes (Signed)
*  PRELIMINARY RESULTS* Echocardiogram 2D Echocardiogram has been performed.  Christopher Burns 10/23/2020, 3:06 PM

## 2020-10-23 NOTE — Progress Notes (Signed)
[facility] Progress Note  Patient: Christopher Burns    IDP:[id number]  DOA: 10/22/2020     Date of Service: the patient was seen and examined on 10/23/2020  Chief Complaint  Patient presents with  . Chest Pain   Brief hospital course: Jeron Grahn is a 50 y.o. male with medical history significant for secondary polycythemia, history of testosterone replacement therapy, hypercholesteremia, obstructive sleep apnea on CPAP, hypogonad on testosterone replacement therapy, hypertension, GERD, nonbleeding external hemorrhoids, presented to the emergency department via primary PCP medical clinic for chief concerns of chest tightness and shortness of breath.  He was sent to the ED by his PCP for further evaluation.  He reports the chest tightness started about 2 weeks ago while he was walking.  He endorses that he use his albuterol inhaler with improvement.  He further endorses that with rest the chest tightness improved.  He endorses associated shortness of breath.  He endorses compliance with CPAP machine nightly.  He states that he has never felt this way before.  He is not able to localize the chest tightness to one specific area in his chest and states that it is bilateral parasternal.  He denies fever, chills, nausea, vomiting, dysuria, diarrhea.  He states that he does infrequently have some bright red blood in his stool however he has been evaluated for this in the past and was told that he had internal hemorrhoids.  Social history: He lives with his wife. He denies tobacco, EtOH, recreational drug use.  He works as a Emergency planning/management officer.  Vaccinations: covid, has had two doses of covid vaccine with the second one in March 2021Samuel Vonbehren is a 50 y.o. male with medical history significant for secondary polycythemia, history of testosterone replacement therapy, hypercholesteremia, obstructive sleep apnea on CPAP, hypogonad on testosterone replacement therapy, hypertension, GERD,  nonbleeding external hemorrhoids, presented to the emergency department via primary PCP medical clinic for chief concerns of chest tightness and shortness of breath.  He was sent to the ED by his PCP for further evaluation.  He reports the chest tightness started about 2 weeks ago while he was walking.  He endorses that he use his albuterol inhaler with improvement.  He further endorses that with rest the chest tightness improved.  He endorses associated shortness of breath.  He endorses compliance with CPAP machine nightly.  He states that he has never felt this way before.  He is not able to localize the chest tightness to one specific area in his chest and states that it is bilateral parasternal.  He denies fever, chills, nausea, vomiting, dysuria, diarrhea.  He states that he does infrequently have some bright red blood in his stool however he has been evaluated for this in the past and was told that he had internal hemorrhoids.  Social history: He lives with his wife. He denies tobacco, EtOH, recreational drug use.  He works as a Emergency planning/management officer.  Vaccinations: covid, has had two doses of covid vaccine with the second one in March 2021  ED Course: Discussed with ED provider, patient required hospitalization due to bilateral pulmonary embolism with right heart strain.  Vitals in the ED was remarkable for afebrile 98.2, respiration rate of 24, heart rate 95, blood pressure 121/75, satting at 92% on 3 L nasal cannula.  Labs in the ED was markable for serum creatinine of 1.26 and previous serum creatinine 1 year ago was 1.40.  Serum sodium is 138, potassium 4.1, chloride 107, bicarb 21, BUN 24, nonfasting  glucose 100, troponin was initially 47 and increased to 59.  WBC was not elevated.  Hemoglobin 16, platelets 201.  CTA of the chest revealed bilateral pulmonary embolism.  ED provider started patient on heparin GTT, and consulted vascular, Dr. Wyn Quaker.  EDP also ordered bilateral lower extremity  DVT ultrasound.   Assessment and Plan: Principal Problem:   Pulmonary embolism (HCC) Active Problems:   Polycythemia   Mixed hyperlipidemia   Hypertension   Hypogonadism in male   # Bilateral pulmonary embolism -Patient reports that formally he received testosterone injections however has discontinued this about 1 year ago when he was diagnosed with with polycythemia secondary to the testosterone injections -Patient states that he takes over-the-counter supplements marketed as over-the-counter testosterone medication, tribulus terrestris 8650 mg daily -Bilateral ultrasound of the lower extremity was read as nonocclusive partially compressive thrombus in the right popliteal vein --Continue IV heparin infusion and transition to oral anticoagulation on discharge -Vascular, Dr. Wyn Quaker has been consulted, Rec thrombectomy planned today, keep n.p.o. -Complete cardiac echo has been ordered to assess for cardiac function   # Obstructive sleep apnea on CPAP -Epworth sleepiness score is 6 out of 24 -Per pulmonology note, compliance is average of 7 hours, -AHI is 2.1 -CPAP nightly ordered   # Hypertension-controlled and Soft BP -Patient takes his lisinopril-hydrochlorothiazide 20-12 0.5 nightly Skip dose today due to soft blood pressure and AKI   #AKI most likely due to diuresis Held lisinopril and Continue IV fluid   # History of COVID-19 positive result, 09/10/2019  # Covid test PCR was negative   Chart reviewed.   Colonoscopy on 03/01/2019 revealed nonbleeding external hemorrhoids otherwise normal exam   Body mass index is 39.51 kg/m.       Diet: Currently n.p.o. for thrombectomy, will resume heart healthy diet after the procedure DVT Prophylaxis: Heparin IV infusion  Advance goals of care discussion: Full code  Family Communication: family was present at bedside, at the time of interview.  The pt provided permission to discuss medical plan with the family.  Opportunity was given to ask question and all questions were answered satisfactorily.   Disposition:  Pt is from home, admitted with pulmonary embolism, still has PE, which precludes a safe discharge. Discharge to home, when thrombectomy will be done and patient will be stable to discharge most likely in 1 to 2 days.  Subjective: No overnight issues, patient is still having some chest pressure and cough with some phlegm, denied any hemoptysis.  Patient denied any significant chest pain, no palpitations.  Denied any abdominal pain, no nausea vomiting or diarrhea.    Physical Exam: General:  alert oriented to time, place, and person.  Appear in mild distress, affect appropriate Eyes: PERRLA ENT: Oral Mucosa Clear, moist  Neck: no JVD,  Cardiovascular: S1 and S2 Present, no Murmur,  Respiratory: good respiratory effort, Bilateral Air entry equal and Decreased, no Crackles, no wheezes Abdomen: Bowel Sound present, Soft and no tenderness,  Skin: no rashes Extremities: mild RLE edema, no calf tenderness Neurologic: without any new focal findings Gait not checked due to patient safety concerns  Vitals:   10/23/20 0447 10/23/20 0709 10/23/20 1003 10/23/20 1156  BP: 101/75 100/74  124/76  Pulse: 89 87  90  Resp: 18 20    Temp: 98.3 F (36.8 C) 98.6 F (37 C)  98.7 F (37.1 C)  TempSrc:  Oral  Oral  SpO2: 92% 94% 93% 97%  Weight:      Height:  Intake/Output Summary (Last 24 hours) at 10/23/2020 1300 Last data filed at 10/23/2020 [id number] Gross per 24 hour  Intake 428.43 ml  Output 2350 ml  Net -1921.57 ml   Filed Weights   10/22/20 1126 10/23/20 0044  Weight: 127 kg 126.7 kg    Data Reviewed: I have personally reviewed and interpreted daily labs, tele strips, imagings as discussed above. I reviewed all nursing notes, pharmacy notes, vitals, pertinent old records I have discussed plan of care as described above with RN and patient/family.  CBC: Recent Labs  Lab  10/22/20 1129 10/23/20 0355  WBC 9.0 9.8  HGB 16.7 16.5  HCT 49.1 49.5  MCV 79.3* 80.0  PLT 205 221   Basic Metabolic Panel: Recent Labs  Lab 10/22/20 1129 10/23/20 0355  NA 138 137  K 4.1 4.2  CL 107 105  CO2 21* 21*  GLUCOSE 100* 123*  BUN 24* 28*  CREATININE 1.26* 1.56*  CALCIUM 9.2 9.4    Studies: CT Angio Chest PE W and/or Wo Contrast  Result Date: 10/22/2020 CLINICAL DATA:  Shortness of breath and chest tightness for 2 days. EXAM: CT ANGIOGRAPHY CHEST WITH CONTRAST TECHNIQUE: Multidetector CT imaging of the chest was performed using the standard protocol during bolus administration of intravenous contrast. Multiplanar CT image reconstructions and MIPs were obtained to evaluate the vascular anatomy. CONTRAST:  75 mL OMNIPAQUE IOHEXOL 350 MG/ML SOLN COMPARISON:  PA and lateral chest 10/22/2020. FINDINGS: Cardiovascular: A large embolus is seen in the right main pulmonary artery. Emboli are also seen in branches of the left main pulmonary artery. The RV to LV ratio is 1.27 consistent with right heart strain. There is bowing of the interventricular septum into the left ventricle. Heart size is normal. No pericardial effusion. Mediastinum/Nodes: No enlarged mediastinal, hilar, or axillary lymph nodes. Thyroid gland, trachea, and esophagus demonstrate no significant findings. Lungs/Pleura: No pleural effusion. There is ground-glass attenuating airspace disease in the left upper lobe. The lungs are otherwise clear. Upper Abdomen: Marked fatty infiltration of the liver. Otherwise negative. Musculoskeletal: No acute or focal abnormality. Review of the MIP images confirms the above findings. IMPRESSION: The examination is positive for bilateral pulmonary emboli with a large clot burden and findings consistent with right heart strain. Hazy airspace opacity in left upper lobe is most worrisome for pneumonia rather than pulmonary infarct. Critical Value/emergent results were called by telephone  at the time of interpretation on 10/22/2020 at 2:08 pm to provider [facility] Surgery[facility]VIN PADUCHOWSKI , who verbally acknowledged these results. Electronically Signed   By: Drusilla Kannerhomas  Dalessio M.D.   On: 10/22/2020 14:09   US Venous Img Lower Bilateral  Result Date: 10/22/2020 CLINICAL DATA:  Left calf pain and cramping. EXAM: Right LOWER EXTREMITY VENOUS DOPPLER ULTRASOUND TECHNIQUE: Gray-scale sonography with graded compression, as well as color Doppler and duplex ultrasound were performed to evaluate the lower extremity deep venous systems from the level of the common femoral vein and including the common femoral, femoral, profunda femoral, popliteal and calf veins including the posterior tibial, peroneal and gastrocnemius veins when visible. The superficial great saphenous vein was also interrogated. Spectral Doppler was utilized to evaluate flow at rest and with distal augmentation maneuvers in the common femoral, femoral and popliteal veins. COMPARISON:  None. FINDINGS: Contralateral Common Femoral Vein: Respiratory phasicity is normal and symmetric with the symptomatic side. No evidence of thrombus. Normal compressibility. Common Femoral Vein: No evidence of thrombus. Normal compressibility, respiratory phasicity and response to augmentation. Saphenofemoral Junction: No evidence of thrombus. Normal  compressibility and flow on color Doppler imaging. Profunda Femoral Vein: No evidence of thrombus. Normal compressibility and flow on color Doppler imaging. Femoral Vein: No evidence of thrombus. Normal compressibility, respiratory phasicity and response to augmentation. Popliteal Vein: There is nonocclusive partially compressible thrombus in the popliteal vein. Calf Veins: No evidence of thrombus. Normal compressibility and flow on color Doppler imaging. Superficial Great Saphenous Vein: No evidence of thrombus. Normal compressibility. Venous Reflux:  None. Other Findings:  None. IMPRESSION: Nonocclusive partially compressive  thrombus in the right popliteal vein. These results will be called to the ordering clinician or representative by the Radiologist Assistant, and communication documented in the PACS or [employer]. Electronically Signed   By: Rudie Meyer M.D.   On: 10/22/2020 15:04    Scheduled Meds: . cholecalciferol  2,000 Units Oral Daily  . [START ON 10/24/2020] lisinopril  40 mg Oral Daily   And  . [START ON 10/24/2020] hydrochlorothiazide  25 mg Oral Daily  . montelukast  10 mg Oral QHS  . multivitamin with minerals  1 tablet Oral Daily  . omega-3 acid ethyl esters  1 g Oral Daily  . pantoprazole  80 mg Oral BID AC  . simvastatin  10 mg Oral q1800  . vitamin B-12  100 mcg Oral Daily   Continuous Infusions: . sodium chloride 125 mL/hr at 10/23/20 1144  . heparin 1,650 Units/hr (10/22/20 1511)   PRN Meds: acetaminophen **OR** acetaminophen, albuterol, guaiFENesin-dextromethorphan, ondansetron **OR** ondansetron (ZOFRAN) IV  Time spent: 35 minutes  Author: Gillis Santa. MD [facility] 10/23/2020 1:00 PM  To reach On-call, see care teams to locate the attending and reach out to them via [email or web address]. If 7PM-7AM, please contact night-coverage If you still have difficulty reaching the attending provider, please page the [facility] (Director on Call) for [facility] on amion for assistance.

## 2020-10-23 NOTE — Interval H&P Note (Signed)
History and Physical Interval Note:  10/23/2020 2:50 PM  Christopher Burns  has presented today for surgery, with the diagnosis of Pulmonary embolism.  The various methods of treatment have been discussed with the patient and family. After consideration of risks, benefits and other options for treatment, the patient has consented to  Procedure(s): PULMONARY THROMBECTOMY (Bilateral) as a surgical intervention.  The patient's history has been reviewed, patient examined, no change in status, stable for surgery.  I have reviewed the patient's chart and labs.  Questions were answered to the patient's satisfaction.     Festus Barren

## 2020-10-23 NOTE — Consult Note (Signed)
ANTICOAGULATION CONSULT NOTE  Pharmacy Consult for heparin Indication: pulmonary embolus  Allergies  Allergen Reactions  . Other Itching and Rash    Pt was tested and positive for feline, canine and cockroaches. Pt has rash, itching, watery eyes. And is on allergy injections since 2017    Patient Measurements: Height: 5' 10.5" (179.1 cm) Weight: 126.7 kg (279 lb 4.8 oz) IBW/kg (Calculated) : 74.15 Heparin Dosing Weight: 103 kg  Vital Signs: Temp: 98.3 F (36.8 C) (03/11 0447) BP: 101/75 (03/11 0447) Pulse Rate: 89 (03/11 0447)  Labs: Recent Labs    10/22/20 1129 10/22/20 1343 10/22/20 1418 10/22/20 2117 10/23/20 0355  HGB 16.7  --   --   --  16.5  HCT 49.1  --   --   --  49.5  PLT 205  --   --   --  221  APTT  --   --  27  --   --   LABPROT  --   --  13.1  --   --   INR  --   --  1.0  --   --   HEPARINUNFRC  --   --   --  0.46 0.42  CREATININE 1.26*  --   --   --  1.56*  TROPONINIHS 47* 59*  --   --   --     Estimated Creatinine Clearance: 77.1 mL/min (A) (by C-G formula based on SCr of 1.56 mg/dL (H)).   Medical History: Past Medical History:  Diagnosis Date  . Asthma   . Chronic kidney disease   . Complication of anesthesia    HICCUPS FOR 1 WEEK AFTER BICEP SURGERY  . Dizziness   . GERD (gastroesophageal reflux disease)   . HA (headache)   . Hemorrhoid   . High cholesterol   . Hypercholesteremia   . Polycythemia   . Sleep apnea    NO CPAP    Medications:  No PTA APT or AC  Assessment: 50 y.o. male with a past medical history of asthma, CKD, HTN, HLD presents to the emergency department for shortness of breath. CT shows bilateral PE with a large clot burden and findings consistent with right heart strain. Pharmacy has been consulted for heparin dosing. H/H and plts WNL  Heparin Dosing Weight: 103 kg  Goal of Therapy:  Heparin level 0.3-0.7 units/ml Monitor platelets by anticoagulation protocol: Yes   Plan:   3/10 2117 HL 0.46, therapeutic x  1 3/11 0355 HL 0.42, therapeutic x 2  Continue heparin drip at 1650 units/hr. HL and CBC daily while on heparin drip.  Otelia Sergeant, PharmD, [facility] 10/23/2020 5:02 AM

## 2020-10-23 NOTE — H&P (View-Only) (Signed)
[facility] Vascular Consult Note  MRN : [id number]  Christopher Burns is a 50 y.o. (March 30, 1971) male who presents with chief complaint of  Chief Complaint  Patient presents with  . Chest Pain   History of Present Illness: Christopher Burns a 50 y.o.malewith medical history significant forsecondary polycythemia, hypercholesteremia, obstructive sleep apnea on CPAP, hypogonad on testosterone replacement therapy, hypertension, GERD,nonbleeding external hemorrhoids,presented to the emergency department via primary PCP medical clinic for chief concerns of chest tightness and shortness of breath.  He was sent to the ED by his PCP for further evaluation.  He reports the chest tightness started about 2 weeks ago while he was walking. He endorses that he use his albuterol inhaler with improvement. He further endorses that with rest the chest tightness improved. He endorses associated shortness of breath. He endorses compliance with CPAP machine nightly. He states that he has never felt this way before. He is not able to localize the chest tightness to one specific area in his chest and states that it is bilateral parasternal.  He denies fever, chills, nausea, vomiting, dysuria, diarrhea. He states that he does infrequently have some bright red blood in his stool however he has been evaluated for this in the past and was told that he had internal hemorrhoids.  Vascular surgery was consulted by Dr. Lucianne Muss for possible endovascular intervention.  Current Facility-Administered Medications  Medication Dose Route Frequency Provider Last Rate Last Admin  . ceFAZolin (ANCEF) 1-4 GM/50ML-% IVPB           . 0.9 %  sodium chloride infusion   Intravenous Continuous Javed Cotto A, PA-C 125 mL/hr at 10/23/20 1144 New Bag at 10/23/20 1144  . 0.9 %  sodium chloride infusion   Intravenous Continuous Aemon Koeller A, PA-C      . acetaminophen (TYLENOL) tablet 325 mg   325 mg Oral Q6H PRN Cox, Amy N, DO       Or  . acetaminophen (TYLENOL) suppository 325 mg  325 mg Rectal Q6H PRN Cox, Amy N, DO      . albuterol (VENTOLIN HFA) 108 (90 Base) MCG/ACT inhaler 2 puff  2 puff Inhalation Q6H PRN Cox, Amy N, DO      . [START ON 10/24/2020] ceFAZolin (ANCEF) IVPB 1 g/50 mL premix  1 g Intravenous Once Marcell Chavarin A, PA-C      . cholecalciferol (VITAMIN D3) tablet 2,000 Units  2,000 Units Oral Daily Cox, Amy N, DO   2,000 Units at 10/23/20 0918  . diphenhydrAMINE (BENADRYL) injection 50 mg  50 mg Intravenous Once PRN Michaelpaul Apo A, PA-C      . famotidine (PEPCID) tablet 40 mg  40 mg Oral Once PRN Solan Vosler A, PA-C      . guaiFENesin-dextromethorphan (ROBITUSSIN DM) 100-10 MG/5ML syrup 5 mL  5 mL Oral Q4H PRN Gillis Santa, MD      . heparin ADULT infusion 100 units/mL (25000 units/29mL)  1,650 Units/hr Intravenous Continuous Cox, Amy N, DO 16.5 mL/hr at 10/22/20 1511 1,650 Units/hr at 10/22/20 1511  . [START ON 10/24/2020] lisinopril (ZESTRIL) tablet 40 mg  40 mg Oral Daily Gillis Santa, MD       And  . Melene Muller ON 10/24/2020] hydrochlorothiazide (HYDRODIURIL) tablet 25 mg  25 mg Oral Daily Gillis Santa, MD      . HYDROmorphone (DILAUDID) injection 1 mg  1 mg Intravenous Once PRN Mycah Mcdougall A, PA-C      . methylPREDNISolone sodium succinate (SOLU-MEDROL) 125  mg/2 mL injection 125 mg  125 mg Intravenous Once PRN Aften Lipsey A, PA-C      . midazolam (VERSED) 2 MG/ML syrup 8 mg  8 mg Oral Once PRN Raesean Bartoletti A, PA-C      . montelukast (SINGULAIR) tablet 10 mg  10 mg Oral QHS Cox, Amy N, DO   10 mg at 10/22/20 2200  . multivitamin with minerals tablet 1 tablet  1 tablet Oral Daily Cox, Amy N, DO   1 tablet at 10/23/20 0917  . omega-3 acid ethyl esters (LOVAZA) capsule 1 g  1 g Oral Daily Cox, Amy N, DO   1 g at 10/23/20 0918  . ondansetron (ZOFRAN) tablet 4 mg  4 mg Oral Q6H PRN Cox, Amy N, DO       Or  . ondansetron  (ZOFRAN) injection 4 mg  4 mg Intravenous Q6H PRN Cox, Amy N, DO      . ondansetron (ZOFRAN) injection 4 mg  4 mg Intravenous Q6H PRN Jamilett Ferrante A, PA-C      . pantoprazole (PROTONIX) EC tablet 80 mg  80 mg Oral BID AC Cox, Amy N, DO   80 mg at 10/23/20 0918  . simvastatin (ZOCOR) tablet 10 mg  10 mg Oral q1800 Cox, Amy N, DO   10 mg at 10/22/20 2200  . vitamin B-12 (CYANOCOBALAMIN) tablet 100 mcg  100 mcg Oral Daily Cox, Amy N, DO   100 mcg at 10/23/20 0370   Past Medical History:  Diagnosis Date  . Asthma   . Chronic kidney disease   . Complication of anesthesia    HICCUPS FOR 1 WEEK AFTER BICEP SURGERY  . Dizziness   . GERD (gastroesophageal reflux disease)   . HA (headache)   . Hemorrhoid   . High cholesterol   . Hypercholesteremia   . Polycythemia   . Sleep apnea    NO CPAP   Past Surgical History:  Procedure Laterality Date  . bisep Left 2007  . COLONOSCOPY WITH PROPOFOL N/A 03/01/2019   Procedure: COLONOSCOPY WITH PROPOFOL;  Surgeon: Wyline Mood, MD;  Location: [facility] ENDOSCOPY;  Service: Gastroenterology;  Laterality: N/A;  . KNEE SURGERY Right   . LASIK    . TONSILLECTOMY    . UMBILICAL HERNIA REPAIR N/A 07/29/2015   Procedure: HERNIA REPAIR UMBILICAL ADULT;  Surgeon: Kieth Brightly, MD;  Location: [facility] ORS;  Service: General;  Laterality: N/A;  . VASOTOMY     Social History Social History   Tobacco Use  . Smoking status: Never Smoker  . Smokeless tobacco: Never Used  Vaping Use  . Vaping Use: Never used  Substance Use Topics  . Alcohol use: Not Currently  . Drug use: No   Family History Family History  Problem Relation Age of Onset  . Cancer Mother        breast  . Brain cancer Mother   . High Cholesterol Mother   . Diabetes Father   . High blood pressure Father   . High Cholesterol Father   . Prostate cancer Paternal Grandfather   . Cancer Maternal Grandmother   . Cancer Maternal Grandfather    Allergies  Allergen Reactions  .  Other Itching and Rash    Pt was tested and positive for feline, canine and cockroaches. Pt has rash, itching, watery eyes. And is on allergy injections since 2017   REVIEW OF SYSTEMS (Negative unless checked)  Constitutional: [] Weight loss  [] Fever  [] Chills Cardiac: [] Chest pain   [  x]Chest pressure   Palpitations   Shortness of breath when laying flat   Shortness of breath at rest   Shortness of breath with exertion. Vascular:  Pain in legs with walking   Pain in legs at rest   Pain in legs when laying flat   Claudication   Pain in feet when walking  Pain in feet at rest  Pain in feet when laying flat   History of DVT   Phlebitis   Swelling in legs   Varicose veins   Non-healing ulcers Pulmonary:   Uses home oxygen   Productive cough   Hemoptysis   Wheeze  COPD   Asthma Neurologic:  Dizziness  Blackouts   Seizures   History of stroke   History of TIA  Aphasia   Temporary blindness   Dysphagia   Weakness or numbness in arms   Weakness or numbness in legs Musculoskeletal:  Arthritis   Joint swelling   Joint pain   Low back pain Hematologic:  Easy bruising  Easy bleeding   Hypercoagulable state   Anemic  Hepatitis Gastrointestinal:  Blood in stool   Vomiting blood  Gastroesophageal reflux/heartburn   Difficulty swallowing. Genitourinary:  Chronic kidney disease   Difficult urination  Frequent urination  Burning with urination   Blood in urine Skin:  Rashes   Ulcers   Wounds Psychological:  History of anxiety    History of major depression.  Physical Examination  Vitals:   10/23/20 0447 10/23/20 0709 10/23/20 1003 10/23/20 1156  BP: 101/75 100/74  124/76  Pulse: 89 87  90  Resp: 18 20    Temp: 98.3 F (36.8 C) 98.6 F (37 C)  98.7 F (37.1 C)  TempSrc:  Oral  Oral  SpO2: 92% 94% 93% 97%  Weight:      Height:       Body mass index is 39.51 kg/m. Gen:  WD/WN, NAD Head:  [place]/AT, No temporalis wasting. Prominent temp pulse not noted. Ear/Nose/Throat: Hearing grossly intact, nares w/o erythema or drainage, oropharynx w/o Erythema/Exudate Eyes: Sclera non-icteric, conjunctiva clear Neck: Trachea midline.  No JVD.  Pulmonary:  Respirations with minimal labor, on supplemental O2 via nasal cannula. Cardiac: RRR, normal S1, S2. Vascular:  Vessel Right Left  Radial Palpable Palpable  Ulnar Palpable Palpable  Brachial Palpable Palpable  Carotid Palpable, without bruit Palpable, without bruit  Aorta Not palpable N/A  Femoral Palpable Palpable  Popliteal Palpable Palpable  PT Palpable Palpable  DP Palpable Palpable   Gastrointestinal: soft, non-tender/non-distended. No guarding/reflex.  Musculoskeletal: M/S 5/5 throughout.  Extremities without ischemic changes.  No deformity or atrophy. No edema. Neurologic: Sensation grossly intact in extremities.  Symmetrical.  Speech is fluent. Motor exam as listed above. Psychiatric: Judgment intact, Mood & affect appropriate for pt's clinical situation. Dermatologic: No rashes or ulcers noted.  No cellulitis or open wounds. Lymph : No Cervical, Axillary, or Inguinal lymphadenopathy.  CBC Lab Results  Component Value Date   WBC 9.8 10/23/2020   HGB 16.5 10/23/2020   HCT 49.5 10/23/2020   MCV 80.0 10/23/2020   PLT 221 10/23/2020   BMET    Component Value Date/Time   NA 137 10/23/2020 0355   K 4.2 10/23/2020 0355   CL 105 10/23/2020 0355   CO2 21 (L) 10/23/2020 0355   GLUCOSE 123 (H) 10/23/2020 0355   BUN 28 (H) 10/23/2020 0355   CREATININE 1.56 (H) 10/23/2020 0355   CALCIUM 9.4 10/23/2020 0355   GFRNONAA 54 (L) 10/23/2020 0355  GFRAA >60 07/29/2019 1041   Estimated Creatinine Clearance: 77.1 mL/min (A) (by C-G formula based on SCr of 1.56 mg/dL (H)).  COAG Lab Results  Component Value Date   INR 1.0 10/22/2020   Radiology DG Chest 2 View  Result Date: 10/22/2020 CLINICAL DATA:  Chest tightness.   Shortness of breath. EXAM: CHEST - 2 VIEW COMPARISON:  01/04/2011. FINDINGS: Borderline cardiomegaly and pulmonary venous congestion. Low lung volumes. Mild infiltrate left upper lung cannot be excluded. No pleural effusion or pneumothorax. No acute bony abnormality. IMPRESSION: 1.  Borderline cardiomegaly and pulmonary venous congestion. 2. Low lung volumes. Mild infiltrate left upper lung cannot be excluded. Electronically Signed   By: Maisie Fushomas  Register   On: 10/22/2020 12:32   CT Angio Chest PE W and/or Wo Contrast  Result Date: 10/22/2020 CLINICAL DATA:  Shortness of breath and chest tightness for 2 days. EXAM: CT ANGIOGRAPHY CHEST WITH CONTRAST TECHNIQUE: Multidetector CT imaging of the chest was performed using the standard protocol during bolus administration of intravenous contrast. Multiplanar CT image reconstructions and MIPs were obtained to evaluate the vascular anatomy. CONTRAST:  75 mL OMNIPAQUE IOHEXOL 350 MG/ML SOLN COMPARISON:  PA and lateral chest 10/22/2020. FINDINGS: Cardiovascular: A large embolus is seen in the right main pulmonary artery. Emboli are also seen in branches of the left main pulmonary artery. The RV to LV ratio is 1.27 consistent with right heart strain. There is bowing of the interventricular septum into the left ventricle. Heart size is normal. No pericardial effusion. Mediastinum/Nodes: No enlarged mediastinal, hilar, or axillary lymph nodes. Thyroid gland, trachea, and esophagus demonstrate no significant findings. Lungs/Pleura: No pleural effusion. There is ground-glass attenuating airspace disease in the left upper lobe. The lungs are otherwise clear. Upper Abdomen: Marked fatty infiltration of the liver. Otherwise negative. Musculoskeletal: No acute or focal abnormality. Review of the MIP images confirms the above findings. IMPRESSION: The examination is positive for bilateral pulmonary emboli with a large clot burden and findings consistent with right heart strain. Hazy  airspace opacity in left upper lobe is most worrisome for pneumonia rather than pulmonary infarct. Critical Value/emergent results were called by telephone at the time of interpretation on 10/22/2020 at 2:08 pm to provider [facility]VIN PADUCHOWSKI , who verbally acknowledged these results. Electronically Signed   By: Drusilla Kannerhomas  Dalessio M.D.   On: 10/22/2020 14:09   US Venous Img Lower Bilateral  Result Date: 10/22/2020 CLINICAL DATA:  Left calf pain and cramping. EXAM: Right LOWER EXTREMITY VENOUS DOPPLER ULTRASOUND TECHNIQUE: Gray-scale sonography with graded compression, as well as color Doppler and duplex ultrasound were performed to evaluate the lower extremity deep venous systems from the level of the common femoral vein and including the common femoral, femoral, profunda femoral, popliteal and calf veins including the posterior tibial, peroneal and gastrocnemius veins when visible. The superficial great saphenous vein was also interrogated. Spectral Doppler was utilized to evaluate flow at rest and with distal augmentation maneuvers in the common femoral, femoral and popliteal veins. COMPARISON:  None. FINDINGS: Contralateral Common Femoral Vein: Respiratory phasicity is normal and symmetric with the symptomatic side. No evidence of thrombus. Normal compressibility. Common Femoral Vein: No evidence of thrombus. Normal compressibility, respiratory phasicity and response to augmentation. Saphenofemoral Junction: No evidence of thrombus. Normal compressibility and flow on color Doppler imaging. Profunda Femoral Vein: No evidence of thrombus. Normal compressibility and flow on color Doppler imaging. Femoral Vein: No evidence of thrombus. Normal compressibility, respiratory phasicity and response to augmentation. Popliteal Vein: There is nonocclusive partially  compressible thrombus in the popliteal vein. Calf Veins: No evidence of thrombus. Normal compressibility and flow on color Doppler imaging. Superficial Great  Saphenous Vein: No evidence of thrombus. Normal compressibility. Venous Reflux:  None. Other Findings:  None. IMPRESSION: Nonocclusive partially compressive thrombus in the right popliteal vein. These results will be called to the ordering clinician or representative by the Radiologist Assistant, and communication documented in the PACS or [employer]. Electronically Signed   By: Rudie Meyer M.D.   On: 10/22/2020 15:04   Assessment/Plan The patient is a 50 year old male who presented to the [facility] Emergency Department with progressively worsening shortness of breath and chest discomfort. Patient found to have submassive bilateral PE with right heart strain.  1.  Pulmonary Embolism: Patient presents with progressively worsening shortness of breath and chest discomfort. Shortness of breath worsens with activity. CTA dated October 23, 2018 notable for positive bilateral pulmonary emboli with large clot burden and right heart strain.  Patient was initiated on heparin. In the setting of progressively worsening shortness of breath especially with ambulation, large clot burden and right heart strain recommend patient undergo a pulmonary thrombectomy/thrombolysis in an attempt to improve the patient's symptoms, reduce the clot burden, and decreased the patient's right heart strain.  Procedure, risks and benefits were explained to the patient.  All questions were answered.  Patient wishes to proceed.  2.  DVT: The patient was found to have a nonocclusive partially compressible thrombus in the right popliteal vein. This is asymptomatic and his fistula goal exam is relatively unremarkable. Agree with initiation of heparin.  3.  Need for anticoagulation: Due to the patient's recent diagnosis of pulmonary embolism he understands that he will be on anticoagulation for most likely 1 year.  Discussed with Dr. Weldon Inches, PA-C  10/23/2020 2:07 PM  This note was  created with Dragon medical transcription system.  Any error is purely unintentional

## 2020-10-23 NOTE — Consult Note (Signed)
[facility] Vascular Consult Note  MRN : [id number]  Christopher Burns is a 50 y.o. (March 30, 1971) male who presents with chief complaint of  Chief Complaint  Patient presents with  . Chest Pain   History of Present Illness: Christopher Burns a 50 y.o.malewith medical history significant forsecondary polycythemia, hypercholesteremia, obstructive sleep apnea on CPAP, hypogonad on testosterone replacement therapy, hypertension, GERD,nonbleeding external hemorrhoids,presented to the emergency department via primary PCP medical clinic for chief concerns of chest tightness and shortness of breath.  He was sent to the ED by his PCP for further evaluation.  He reports the chest tightness started about 2 weeks ago while he was walking. He endorses that he use his albuterol inhaler with improvement. He further endorses that with rest the chest tightness improved. He endorses associated shortness of breath. He endorses compliance with CPAP machine nightly. He states that he has never felt this way before. He is not able to localize the chest tightness to one specific area in his chest and states that it is bilateral parasternal.  He denies fever, chills, nausea, vomiting, dysuria, diarrhea. He states that he does infrequently have some bright red blood in his stool however he has been evaluated for this in the past and was told that he had internal hemorrhoids.  Vascular surgery was consulted by Dr. Lucianne Muss for possible endovascular intervention.  Current Facility-Administered Medications  Medication Dose Route Frequency Provider Last Rate Last Admin  . ceFAZolin (ANCEF) 1-4 GM/50ML-% IVPB           . 0.9 %  sodium chloride infusion   Intravenous Continuous Lametria Klunk A, PA-C 125 mL/hr at 10/23/20 1144 New Bag at 10/23/20 1144  . 0.9 %  sodium chloride infusion   Intravenous Continuous Akshar Starnes A, PA-C      . acetaminophen (TYLENOL) tablet 325 mg   325 mg Oral Q6H PRN Cox, Amy N, DO       Or  . acetaminophen (TYLENOL) suppository 325 mg  325 mg Rectal Q6H PRN Cox, Amy N, DO      . albuterol (VENTOLIN HFA) 108 (90 Base) MCG/ACT inhaler 2 puff  2 puff Inhalation Q6H PRN Cox, Amy N, DO      . [START ON 10/24/2020] ceFAZolin (ANCEF) IVPB 1 g/50 mL premix  1 g Intravenous Once Shamra Bradeen A, PA-C      . cholecalciferol (VITAMIN D3) tablet 2,000 Units  2,000 Units Oral Daily Cox, Amy N, DO   2,000 Units at 10/23/20 0918  . diphenhydrAMINE (BENADRYL) injection 50 mg  50 mg Intravenous Once PRN Maizy Davanzo A, PA-C      . famotidine (PEPCID) tablet 40 mg  40 mg Oral Once PRN Kobey Sides A, PA-C      . guaiFENesin-dextromethorphan (ROBITUSSIN DM) 100-10 MG/5ML syrup 5 mL  5 mL Oral Q4H PRN Gillis Santa, MD      . heparin ADULT infusion 100 units/mL (25000 units/29mL)  1,650 Units/hr Intravenous Continuous Cox, Amy N, DO 16.5 mL/hr at 10/22/20 1511 1,650 Units/hr at 10/22/20 1511  . [START ON 10/24/2020] lisinopril (ZESTRIL) tablet 40 mg  40 mg Oral Daily Gillis Santa, MD       And  . Melene Muller ON 10/24/2020] hydrochlorothiazide (HYDRODIURIL) tablet 25 mg  25 mg Oral Daily Gillis Santa, MD      . HYDROmorphone (DILAUDID) injection 1 mg  1 mg Intravenous Once PRN Dartha Rozzell A, PA-C      . methylPREDNISolone sodium succinate (SOLU-MEDROL) 125  mg/2 mL injection 125 mg  125 mg Intravenous Once PRN Anam Bobby A, PA-C      . midazolam (VERSED) 2 MG/ML syrup 8 mg  8 mg Oral Once PRN Thurma Priego A, PA-C      . montelukast (SINGULAIR) tablet 10 mg  10 mg Oral QHS Cox, Amy N, DO   10 mg at 10/22/20 2200  . multivitamin with minerals tablet 1 tablet  1 tablet Oral Daily Cox, Amy N, DO   1 tablet at 10/23/20 0917  . omega-3 acid ethyl esters (LOVAZA) capsule 1 g  1 g Oral Daily Cox, Amy N, DO   1 g at 10/23/20 0918  . ondansetron (ZOFRAN) tablet 4 mg  4 mg Oral Q6H PRN Cox, Amy N, DO       Or  . ondansetron  (ZOFRAN) injection 4 mg  4 mg Intravenous Q6H PRN Cox, Amy N, DO      . ondansetron (ZOFRAN) injection 4 mg  4 mg Intravenous Q6H PRN Camden Mazzaferro A, PA-C      . pantoprazole (PROTONIX) EC tablet 80 mg  80 mg Oral BID AC Cox, Amy N, DO   80 mg at 10/23/20 0918  . simvastatin (ZOCOR) tablet 10 mg  10 mg Oral q1800 Cox, Amy N, DO   10 mg at 10/22/20 2200  . vitamin B-12 (CYANOCOBALAMIN) tablet 100 mcg  100 mcg Oral Daily Cox, Amy N, DO   100 mcg at 10/23/20 0370   Past Medical History:  Diagnosis Date  . Asthma   . Chronic kidney disease   . Complication of anesthesia    HICCUPS FOR 1 WEEK AFTER BICEP SURGERY  . Dizziness   . GERD (gastroesophageal reflux disease)   . HA (headache)   . Hemorrhoid   . High cholesterol   . Hypercholesteremia   . Polycythemia   . Sleep apnea    NO CPAP   Past Surgical History:  Procedure Laterality Date  . bisep Left 2007  . COLONOSCOPY WITH PROPOFOL N/A 03/01/2019   Procedure: COLONOSCOPY WITH PROPOFOL;  Surgeon: Wyline Mood, MD;  Location: [facility] ENDOSCOPY;  Service: Gastroenterology;  Laterality: N/A;  . KNEE SURGERY Right   . LASIK    . TONSILLECTOMY    . UMBILICAL HERNIA REPAIR N/A 07/29/2015   Procedure: HERNIA REPAIR UMBILICAL ADULT;  Surgeon: Kieth Brightly, MD;  Location: [facility] ORS;  Service: General;  Laterality: N/A;  . VASOTOMY     Social History Social History   Tobacco Use  . Smoking status: Never Smoker  . Smokeless tobacco: Never Used  Vaping Use  . Vaping Use: Never used  Substance Use Topics  . Alcohol use: Not Currently  . Drug use: No   Family History Family History  Problem Relation Age of Onset  . Cancer Mother        breast  . Brain cancer Mother   . High Cholesterol Mother   . Diabetes Father   . High blood pressure Father   . High Cholesterol Father   . Prostate cancer Paternal Grandfather   . Cancer Maternal Grandmother   . Cancer Maternal Grandfather    Allergies  Allergen Reactions  .  Other Itching and Rash    Pt was tested and positive for feline, canine and cockroaches. Pt has rash, itching, watery eyes. And is on allergy injections since 2017   REVIEW OF SYSTEMS (Negative unless checked)  Constitutional: [] Weight loss  [] Fever  [] Chills Cardiac: [] Chest pain   [  x]Chest pressure   []Palpitations   [x]Shortness of breath when laying flat   [x]Shortness of breath at rest   [x]Shortness of breath with exertion. Vascular:  []Pain in legs with walking   []Pain in legs at rest   []Pain in legs when laying flat   []Claudication   []Pain in feet when walking  []Pain in feet at rest  []Pain in feet when laying flat   []History of DVT   []Phlebitis   []Swelling in legs   []Varicose veins   []Non-healing ulcers Pulmonary:   []Uses home oxygen   []Productive cough   []Hemoptysis   []Wheeze  []COPD   []Asthma Neurologic:  []Dizziness  []Blackouts   []Seizures   []History of stroke   []History of TIA  []Aphasia   []Temporary blindness   []Dysphagia   []Weakness or numbness in arms   []Weakness or numbness in legs Musculoskeletal:  []Arthritis   []Joint swelling   []Joint pain   []Low back pain Hematologic:  []Easy bruising  []Easy bleeding   []Hypercoagulable state   []Anemic  []Hepatitis Gastrointestinal:  []Blood in stool   []Vomiting blood  []Gastroesophageal reflux/heartburn   []Difficulty swallowing. Genitourinary:  []Chronic kidney disease   []Difficult urination  []Frequent urination  []Burning with urination   []Blood in urine Skin:  []Rashes   []Ulcers   []Wounds Psychological:  []History of anxiety   [] History of major depression.  Physical Examination  Vitals:   10/23/20 0447 10/23/20 0709 10/23/20 1003 10/23/20 1156  BP: 101/75 100/74  124/76  Pulse: 89 87  90  Resp: 18 20    Temp: 98.3 F (36.8 C) 98.6 F (37 C)  98.7 F (37.1 C)  TempSrc:  Oral  Oral  SpO2: 92% 94% 93% 97%  Weight:      Height:       Body mass index is 39.51 kg/m. Gen:  WD/WN, NAD Head:  [place]/AT, No temporalis wasting. Prominent temp pulse not noted. Ear/Nose/Throat: Hearing grossly intact, nares w/o erythema or drainage, oropharynx w/o Erythema/Exudate Eyes: Sclera non-icteric, conjunctiva clear Neck: Trachea midline.  No JVD.  Pulmonary:  Respirations with minimal labor, on supplemental O2 via nasal cannula. Cardiac: RRR, normal S1, S2. Vascular:  Vessel Right Left  Radial Palpable Palpable  Ulnar Palpable Palpable  Brachial Palpable Palpable  Carotid Palpable, without bruit Palpable, without bruit  Aorta Not palpable N/A  Femoral Palpable Palpable  Popliteal Palpable Palpable  PT Palpable Palpable  DP Palpable Palpable   Gastrointestinal: soft, non-tender/non-distended. No guarding/reflex.  Musculoskeletal: M/S 5/5 throughout.  Extremities without ischemic changes.  No deformity or atrophy. No edema. Neurologic: Sensation grossly intact in extremities.  Symmetrical.  Speech is fluent. Motor exam as listed above. Psychiatric: Judgment intact, Mood & affect appropriate for pt's clinical situation. Dermatologic: No rashes or ulcers noted.  No cellulitis or open wounds. Lymph : No Cervical, Axillary, or Inguinal lymphadenopathy.  CBC Lab Results  Component Value Date   WBC 9.8 10/23/2020   HGB 16.5 10/23/2020   HCT 49.5 10/23/2020   MCV 80.0 10/23/2020   PLT 221 10/23/2020   BMET    Component Value Date/Time   NA 137 10/23/2020 0355   K 4.2 10/23/2020 0355   CL 105 10/23/2020 0355   CO2 21 (L) 10/23/2020 0355   GLUCOSE 123 (H) 10/23/2020 0355   BUN 28 (H) 10/23/2020 0355   CREATININE 1.56 (H) 10/23/2020 0355   CALCIUM 9.4 10/23/2020 0355   GFRNONAA 54 (L) 10/23/2020 0355     GFRAA >60 07/29/2019 1041   Estimated Creatinine Clearance: 77.1 mL/min (A) (by C-G formula based on SCr of 1.56 mg/dL (H)).  COAG Lab Results  Component Value Date   INR 1.0 10/22/2020   Radiology DG Chest 2 View  Result Date: 10/22/2020 CLINICAL DATA:  Chest tightness.   Shortness of breath. EXAM: CHEST - 2 VIEW COMPARISON:  01/04/2011. FINDINGS: Borderline cardiomegaly and pulmonary venous congestion. Low lung volumes. Mild infiltrate left upper lung cannot be excluded. No pleural effusion or pneumothorax. No acute bony abnormality. IMPRESSION: 1.  Borderline cardiomegaly and pulmonary venous congestion. 2. Low lung volumes. Mild infiltrate left upper lung cannot be excluded. Electronically Signed   By: Maisie Fushomas  Register   On: 10/22/2020 12:32   CT Angio Chest PE W and/or Wo Contrast  Result Date: 10/22/2020 CLINICAL DATA:  Shortness of breath and chest tightness for 2 days. EXAM: CT ANGIOGRAPHY CHEST WITH CONTRAST TECHNIQUE: Multidetector CT imaging of the chest was performed using the standard protocol during bolus administration of intravenous contrast. Multiplanar CT image reconstructions and MIPs were obtained to evaluate the vascular anatomy. CONTRAST:  75 mL OMNIPAQUE IOHEXOL 350 MG/ML SOLN COMPARISON:  PA and lateral chest 10/22/2020. FINDINGS: Cardiovascular: A large embolus is seen in the right main pulmonary artery. Emboli are also seen in branches of the left main pulmonary artery. The RV to LV ratio is 1.27 consistent with right heart strain. There is bowing of the interventricular septum into the left ventricle. Heart size is normal. No pericardial effusion. Mediastinum/Nodes: No enlarged mediastinal, hilar, or axillary lymph nodes. Thyroid gland, trachea, and esophagus demonstrate no significant findings. Lungs/Pleura: No pleural effusion. There is ground-glass attenuating airspace disease in the left upper lobe. The lungs are otherwise clear. Upper Abdomen: Marked fatty infiltration of the liver. Otherwise negative. Musculoskeletal: No acute or focal abnormality. Review of the MIP images confirms the above findings. IMPRESSION: The examination is positive for bilateral pulmonary emboli with a large clot burden and findings consistent with right heart strain. Hazy  airspace opacity in left upper lobe is most worrisome for pneumonia rather than pulmonary infarct. Critical Value/emergent results were called by telephone at the time of interpretation on 10/22/2020 at 2:08 pm to provider [facility] PAD[facility]OW[facility]I , who verbally acknowledged these results. Electronically Signed   By: Drusilla Kannerhomas  Dalessio M.D.   On: 10/22/2020 14:09   US Venous Img Lower Bilateral  Result Date: 10/22/2020 CLINICAL DATA:  Left calf pain and cramping. EXAM: Right LOWER EXTREMITY VENOUS DOPPLER ULTRASOUND TECHNIQUE: Gray-scale sonography with graded compression, as well as color Doppler and duplex ultrasound were performed to evaluate the lower extremity deep venous systems from the level of the common femoral vein and including the common femoral, femoral, profunda femoral, popliteal and calf veins including the posterior tibial, peroneal and gastrocnemius veins when visible. The superficial great saphenous vein was also interrogated. Spectral Doppler was utilized to evaluate flow at rest and with distal augmentation maneuvers in the common femoral, femoral and popliteal veins. COMPARISON:  None. FINDINGS: Contralateral Common Femoral Vein: Respiratory phasicity is normal and symmetric with the symptomatic side. No evidence of thrombus. Normal compressibility. Common Femoral Vein: No evidence of thrombus. Normal compressibility, respiratory phasicity and response to augmentation. Saphenofemoral Junction: No evidence of thrombus. Normal compressibility and flow on color Doppler imaging. Profunda Femoral Vein: No evidence of thrombus. Normal compressibility and flow on color Doppler imaging. Femoral Vein: No evidence of thrombus. Normal compressibility, respiratory phasicity and response to augmentation. Popliteal Vein: There is nonocclusive partially  compressible thrombus in the popliteal vein. Calf Veins: No evidence of thrombus. Normal compressibility and flow on color Doppler imaging. Superficial Great  Saphenous Vein: No evidence of thrombus. Normal compressibility. Venous Reflux:  None. Other Findings:  None. IMPRESSION: Nonocclusive partially compressive thrombus in the right popliteal vein. These results will be called to the ordering clinician or representative by the Radiologist Assistant, and communication documented in the PACS or [employer]. Electronically Signed   By: Rudie Meyer M.D.   On: 10/22/2020 15:04   Assessment/Plan The patient is a 50 year old male who presented to the [facility] Emergency Department with progressively worsening shortness of breath and chest discomfort. Patient found to have submassive bilateral PE with right heart strain.  1.  Pulmonary Embolism: Patient presents with progressively worsening shortness of breath and chest discomfort. Shortness of breath worsens with activity. CTA dated October 23, 2018 notable for positive bilateral pulmonary emboli with large clot burden and right heart strain.  Patient was initiated on heparin. In the setting of progressively worsening shortness of breath especially with ambulation, large clot burden and right heart strain recommend patient undergo a pulmonary thrombectomy/thrombolysis in an attempt to improve the patient's symptoms, reduce the clot burden, and decreased the patient's right heart strain.  Procedure, risks and benefits were explained to the patient.  All questions were answered.  Patient wishes to proceed.  2.  DVT: The patient was found to have a nonocclusive partially compressible thrombus in the right popliteal vein. This is asymptomatic and his fistula goal exam is relatively unremarkable. Agree with initiation of heparin.  3.  Need for anticoagulation: Due to the patient's recent diagnosis of pulmonary embolism he understands that he will be on anticoagulation for most likely 1 year.  Discussed with Dr. Weldon Inches, PA-C  10/23/2020 2:07 PM  This note was  created with Dragon medical transcription system.  Any error is purely unintentional

## 2020-10-23 NOTE — Progress Notes (Signed)
Mobility Specialist - Progress Note   10/23/20 1200  Mobility  Activity Contraindicated/medical hold  Mobility performed by Mobility specialist    Per discussion with nurse, request to hold mobility session until after procedure. Pt scheduled for pulmonary thrombectomy later this date. Will attempt session as appropriate.    Filiberto Pinks Mobility Specialist 10/23/20, 12:03 PM

## 2020-10-24 LAB — CBC
HCT: 44.6 % (ref 39.0–52.0)
Hemoglobin: 15.3 g/dL (ref 13.0–17.0)
MCH: 27.3 pg (ref 26.0–34.0)
MCHC: 34.3 g/dL (ref 30.0–36.0)
MCV: 79.6 fL — ABNORMAL LOW (ref 80.0–100.0)
Platelets: 192 10*3/uL (ref 150–400)
RBC: 5.6 MIL/uL (ref 4.22–5.81)
RDW: 13.9 % (ref 11.5–15.5)
WBC: 7.3 10*3/uL (ref 4.0–10.5)
nRBC: 0 % (ref 0.0–0.2)

## 2020-10-24 LAB — BASIC METABOLIC PANEL
Anion gap: 9 (ref 5–15)
BUN: 27 mg/dL — ABNORMAL HIGH (ref 6–20)
CO2: 21 mmol/L — ABNORMAL LOW (ref 22–32)
Calcium: 8.5 mg/dL — ABNORMAL LOW (ref 8.9–10.3)
Chloride: 108 mmol/L (ref 98–111)
Creatinine, Ser: 1.44 mg/dL — ABNORMAL HIGH (ref 0.61–1.24)
GFR, Estimated: 60 mL/min — ABNORMAL LOW (ref >60)
Glucose, Bld: 114 mg/dL — ABNORMAL HIGH (ref 70–99)
Potassium: 4 mmol/L (ref 3.5–5.1)
Sodium: 138 mmol/L (ref 135–145)

## 2020-10-24 LAB — HEPARIN LEVEL (UNFRACTIONATED): Heparin Unfractionated: 0.34 IU/mL (ref 0.30–0.70)

## 2020-10-24 LAB — PHOSPHORUS: Phosphorus: 4.8 mg/dL — ABNORMAL HIGH (ref 2.5–4.6)

## 2020-10-24 LAB — MAGNESIUM: Magnesium: 2.3 mg/dL (ref 1.7–2.4)

## 2020-10-24 MED ORDER — APIXABAN 5 MG PO TABS
10.0000 mg | ORAL_TABLET | Freq: Two times a day (BID) | ORAL | Status: DC
  Administered 2020-10-24 – 2020-10-25 (×3): 10 mg via ORAL
  Filled 2020-10-24 (×3): qty 2

## 2020-10-24 MED ORDER — APIXABAN 5 MG PO TABS: 5.0000 mg | ORAL_TABLET | Freq: Two times a day (BID) | ORAL | Status: DC

## 2020-10-24 NOTE — Progress Notes (Signed)
Mobility Specialist - Progress Note   10/24/20 1400  Mobility  Activity Ambulated in hall  Level of Assistance Independent  Assistive Device None  Distance Ambulated (ft) 350 ft  Mobility Response Tolerated well  Mobility performed by Mobility specialist  $Mobility charge 1 Mobility    Pre-mobility: 87 HR During mobility: 100 HR Post-mobility: 104 HR   Pt ambulated in hallway without AD. Pushed IV pole. Independent. No dizziness/lightheadedness/SOB.

## 2020-10-24 NOTE — Discharge Instructions (Signed)
Information on my medicine - Eliquis (apixaban) This medication education was reviewed with me or my healthcare representative as part of my discharge preparation.  WHY WAS ELIQUIS PRESCRIBED FOR YOU?  Eliquis was prescribed to treat blood clots that may have been found in the veins of your legs (deep vein thrombosis) or in your lungs (pulmonary embolism) and to reduce the risk of them occurring again.  WHAT DO YOU NEED TO KNOW ABOUT ELIQUIS ?  The starting dose is 10 mg (two 5 mg tablets) taken TWICE daily for the FIRST SEVEN (7) DAYS,then on 10/31/20 the dose is reduced to ONE 5 mg tablet taken TWICE daily. Eliquis may be taken with or without food.Try to take the dose about the same time in the morning and in the evening. If you have difficulty swallowing the tablet whole please discuss with your pharmacist how to take the medication safely.Take Eliquis exactly as prescribed and DO NOT stop taking Eliquis without talking to the doctor who prescribed the medication. Stopping may increase your risk of developing a new blood clot. Refill your prescription before you run out. After discharge, you should have regular check-up appointments with your healthcare provider that is prescribing your Eliquis.   WHAT DO YOU DO IF YOU MISS A DOSE? If a dose of ELIQUIS is not taken at the scheduled time, take it as soon as possible on the same day and twice-daily administration should be resumed. The dose should not be doubled to make up for a missed dose.  IMPORTANT SAFETY INFORMATION A possible side effect of Eliquis is bleeding. You should call your healthcare provider right away if you experience any of the following: ?   Bleeding from an injury or your nose that does not stop. ?   Unusual colored urine (red or dark brown) or unusual colored stools (red or black). ?   Unusual bruising for unknown reasons. ?   A serious fall or if you hit your head (even if there is no bleeding).  Some medicines may  interact with Eliquis and might increase your risk of bleeding or clotting while on Eliquis. To help avoid this, consult your healthcare provider or pharmacist prior to using any new prescription or non-prescription medications, including herbals, vitamins, non-steroidal anti-inflammatory drugs (NSAIDs) and supplements.   This website has more information on Eliquis (apixaban): [email or web address].cy.

## 2020-10-24 NOTE — Consult Note (Signed)
ANTICOAGULATION CONSULT NOTE  Pharmacy Consult for heparin Indication: pulmonary embolus  Allergies  Allergen Reactions  . Other Itching and Rash    Pt was tested and positive for feline, canine and cockroaches. Pt has rash, itching, watery eyes. And is on allergy injections since 2017    Patient Measurements: Height: 5' 10.5" (179.1 cm) Weight: 126 kg (277 lb 12.8 oz) IBW/kg (Calculated) : 74.15 Heparin Dosing Weight: 103 kg  Vital Signs: Temp: 97.6 F (36.4 C) (03/12 0414) Temp Source: Oral (03/12 0414) BP: 103/61 (03/12 0414) Pulse Rate: 81 (03/12 0414)  Labs: Recent Labs    10/22/20 1129 10/22/20 1343 10/22/20 1418 10/22/20 2117 10/23/20 0355 10/24/20 0433  HGB 16.7  --   --   --  16.5 15.3  HCT 49.1  --   --   --  49.5 44.6  PLT 205  --   --   --  221 192  APTT  --   --  27  --   --   --   LABPROT  --   --  13.1  --   --   --   INR  --   --  1.0  --   --   --   HEPARINUNFRC  --   --   --  0.46 0.42 0.34  CREATININE 1.26*  --   --   --  1.56* 1.44*  TROPONINIHS 47* 59*  --   --   --   --     Estimated Creatinine Clearance: 83.3 mL/min (A) (by C-G formula based on SCr of 1.44 mg/dL (H)).   Medical History: Past Medical History:  Diagnosis Date  . Asthma   . Chronic kidney disease   . Complication of anesthesia    HICCUPS FOR 1 WEEK AFTER BICEP SURGERY  . Dizziness   . GERD (gastroesophageal reflux disease)   . HA (headache)   . Hemorrhoid   . High cholesterol   . Hypercholesteremia   . Polycythemia   . Sleep apnea    NO CPAP    Medications:  No PTA APT or AC  Assessment: 50 y.o. male with a past medical history of asthma, CKD, HTN, HLD presents to the emergency department for shortness of breath. CT shows bilateral PE with a large clot burden and findings consistent with right heart strain. Pharmacy has been consulted for heparin dosing. H/H and plts WNL  Heparin Dosing Weight: 103 kg  Goal of Therapy:  Heparin level 0.3-0.7  units/ml Monitor platelets by anticoagulation protocol: Yes   Plan:   3/10 2117 HL 0.46, therapeutic x 1 3/11 0355 HL 0.42, therapeutic x 2 3/12 0433 HL 0.34, therapeutic x 3  Continue heparin drip at 1650 units/hr. HL and CBC daily while on heparin drip.  Otelia Sergeant, PharmD, [facility] 10/24/2020 7:01 AM

## 2020-10-24 NOTE — Progress Notes (Addendum)
[facility]  Daily Progress Note   Assessment/Planning:   POD #1 s/p PE thrombolysis and suction thrombectomy   Excellent result  Pt sat >95% on RA  Ok to transition to NOAC  Right femoral vein dressing can be removed  Pt will likely need thrombophilia work-up in the future once AC is suspended as reportedly non-provoked PE  Call Dr. Driscilla Grammes office for follow up in 2 weeks: [phone]   Subjective  - 1 Day Post-Op   "Feeling great"   Objective   Vitals:   10/23/20 1713 10/23/20 1945 10/24/20 0414 10/24/20 0815  BP: 125/81 105/71 103/61 112/63  Pulse: 86 96 81 77  Resp: 18 19 16    Temp: 98.2 F (36.8 C) 97.9 F (36.6 C) 97.6 F (36.4 C) 98.4 F (36.9 C)  TempSrc:  Oral Oral Oral  SpO2: 100% 95% (!) 89% 95%  Weight:   126 kg   Height:         Intake/Output Summary (Last 24 hours) at 10/24/2020 0918 Last data filed at 10/24/2020 0543 Gross per 24 hour  Intake 503.96 ml  Output 400 ml  Net 103.96 ml    PULM  CTAB  CV  RRR  VASC R groin: pressure dressing in place, no hematoma, minimal echymosis    Laboratory   CBC CBC Latest Ref Rng & Units 10/24/2020 10/23/2020 10/22/2020  WBC 4.0 - 10.5 K/uL 7.3 9.8 9.0  Hemoglobin 13.0 - 17.0 g/dL 12/22/2020 95.6 38.7  Hematocrit 39.0 - 52.0 % 44.6 49.5 49.1  Platelets 150 - 400 K/uL 192 221 205    BMET    Component Value Date/Time   NA 138 10/24/2020 0433   K 4.0 10/24/2020 0433   CL 108 10/24/2020 0433   CO2 21 (L) 10/24/2020 0433   GLUCOSE 114 (H) 10/24/2020 0433   BUN 27 (H) 10/24/2020 0433   CREATININE 1.44 (H) 10/24/2020 0433   CALCIUM 8.5 (L) 10/24/2020 0433   GFRNONAA 60 (L) 10/24/2020 0433   GFRAA >60 07/29/2019 1041     07/31/2019, MD, FACS, FSVS Covering for [facility]: [phone]

## 2020-10-24 NOTE — Consult Note (Signed)
ANTICOAGULATION CONSULT NOTE  Pharmacy Consult for Eliquis Indication: pulmonary embolus  Allergies  Allergen Reactions  . Other Itching and Rash    Pt was tested and positive for feline, canine and cockroaches. Pt has rash, itching, watery eyes. And is on allergy injections since 2017    Patient Measurements: Height: 5' 10.5" (179.1 cm) Weight: 126 kg (277 lb 12.8 oz) IBW/kg (Calculated) : 74.15 Heparin Dosing Weight: 103 kg  Vital Signs: Temp: 98.4 F (36.9 C) (03/12 0815) Temp Source: Oral (03/12 0815) BP: 112/63 (03/12 0815) Pulse Rate: 77 (03/12 0815)  Labs: Recent Labs    10/22/20 1129 10/22/20 1343 10/22/20 1418 10/22/20 2117 10/23/20 0355 10/24/20 0433  HGB 16.7  --   --   --  16.5 15.3  HCT 49.1  --   --   --  49.5 44.6  PLT 205  --   --   --  221 192  APTT  --   --  27  --   --   --   LABPROT  --   --  13.1  --   --   --   INR  --   --  1.0  --   --   --   HEPARINUNFRC  --   --   --  0.46 0.42 0.34  CREATININE 1.26*  --   --   --  1.56* 1.44*  TROPONINIHS 47* 59*  --   --   --   --     Estimated Creatinine Clearance: 83.3 mL/min (A) (by C-G formula based on SCr of 1.44 mg/dL (H)).   Medical History: Past Medical History:  Diagnosis Date  . Asthma   . Chronic kidney disease   . Complication of anesthesia    HICCUPS FOR 1 WEEK AFTER BICEP SURGERY  . Dizziness   . GERD (gastroesophageal reflux disease)   . HA (headache)   . Hemorrhoid   . High cholesterol   . Hypercholesteremia   . Polycythemia   . Sleep apnea    NO CPAP    Medications:  No PTA APT or AC  Assessment: 50 y.o. male with a past medical history of asthma, CKD, HTN, HLD presents to the emergency department for shortness of breath. CT shows bilateral PE with a large clot burden and findings consistent with right heart strain. Pharmacy has been consulted to transition from heparin drip to Eliquis.   Goal of Therapy:  Heparin level 0.3-0.7 units/ml Monitor platelets by  anticoagulation protocol: Yes   Plan:   Eliquis 10 mg BID x 7 days followed by 5 mg BID. Stop heparin drip with first dose of Eliquis. CBC q72h per protocol.  Laureen Ochs, PharmD 10/24/2020 11:04 AM

## 2020-10-24 NOTE — Progress Notes (Signed)
PROGRESS NOTE    Christopher Burns  ZOX:[id number] DOB: August 11, 1971 DOA: 10/22/2020 PCP: Corky Downs, MD   Chief Complain: Chest pain  Brief Narrative: Patient is a 50 year old male with history of polycythemia, testosterone replacement therapy for hypogonadism, hyperlipidemia, OSA on CPAP, hypertension, GERD who presented to the emergency department after referral from his primary care physician for the evaluation of chest tightness and shortness of breath.  CTA done in the emergency department showed bilateral pulmonary embolism.  He was also in acute hypoxic respiratory failure requiring 3 L of oxygen presentation.  Troponins were mild elevated.  Started on heparin drip.  Vascular surgery consulted and he underwent thrombolysis and suction thrombectomy on on 10/23/20.  Assessment & Plan:   Principal Problem:   Pulmonary embolism (HCC) Active Problems:   Polycythemia   Mixed hyperlipidemia   Hypertension   Hypogonadism in male   Bilateral pulmonary embolism: Presented with chest pain, shortness of breath.  CT done in the emergency department showed bilateral PE.  Vascular surgery consulted thrombectomy.  He was initially started on heparin drip.  Will change to Eliquis today. He was taking testosterone which could have contributed also. vascular surgery recommended hypercoagulable work-up as an outpatient.  He will follow-up with vascular surgery as an outpatient. Ultrasound of the lower extremities showed nonocclusive partially compressible thrombus in the right popliteal vein. Echo showed ejection fraction of 65 to 70%, grade 1 diastolic dysfunction, right ventricular pressure overload, moderately reduced right ventricular systolic function.  OSA: On CPAP at home.  Hypertension: Currently blood pressure stable.  Takes lisinopril, hydrochlorothiazide at home  AKI versus CKD stage II: His creatinine was 1.4 in 07/2019.  Presented with creatinine in the range of 1.5.  Lisinopril,  hydrochlorothiazide on hold.  Continue gentle IV fluids for today.  Check BMP tomorrow.  Recent history of COVID-19: Recovered  Obesity: BMI of 39.3         DVT prophylaxis: Eliquis Code Status: Full Family Communication: None at the bedside Status is: Inpatient  Remains inpatient appropriate because:Inpatient level of care appropriate due to severity of illness   Dispo: The patient is from: Home              Anticipated d/c is to: Home              Patient currently is not medically stable to d/c.   Difficult to place patient No    Consultants: Vascular surgery  Procedures:As above  Antimicrobials:  Anti-infectives (From admission, onward)   Start     Dose/Rate Route Frequency Ordered Stop   10/24/20 0600  ceFAZolin (ANCEF) IVPB 1 g/50 mL premix       Note to Pharmacy: To be given in specials   1 g 100 mL/hr over 30 Minutes Intravenous  Once 10/23/20 1358 10/23/20 1531      Subjective: Patient seen and examined the bedside this morning.  Hemodynamically stable.  Denies any complaints.  Encouraged him to ambulate.  No chest pain or shortness of breath.  Objective: Vitals:   10/23/20 1713 10/23/20 1945 10/24/20 0414 10/24/20 0815  BP: 125/81 105/71 103/61 112/63  Pulse: 86 96 81 77  Resp: Temp: 98.2 F (36.8 C) 97.9 F (36.6 C) 97.6 F (36.4 C) 98.4 F (36.9 C)  TempSrc:  Oral Oral Oral  SpO2: 100% 95% (!) 89% 95%  Weight:   126 kg   Height:        Intake/Output Summary (Last 24 hours) at  10/24/2020 1023 Last data filed at 10/24/2020 0543 Gross per 24 hour  Intake 503.96 ml  Output 400 ml  Net 103.96 ml   Filed Weights   10/22/20 1126 10/23/20 0044 10/24/20 0414  Weight: 127 kg 126.7 kg 126 kg    Examination:  General exam: Appears calm and comfortable ,Not in distress, morbidly obese HEENT:PERRL,Oral mucosa moist, Ear/Nose normal on gross exam Respiratory system: Bilateral equal air entry, normal vesicular breath sounds, no wheezes  or crackles  Cardiovascular system: S1 & S2 heard, RRR. No JVD, murmurs, rubs, gallops or clicks. No pedal edema. Gastrointestinal system: Abdomen is nondistended, soft and nontender. No organomegaly or masses felt. Normal bowel sounds heard. Central nervous system: Alert and oriented. No focal neurological deficits. Extremities: No edema, no clubbing ,no cyanosis Skin: No rashes, lesions or ulcers,no icterus ,no pallor   Data Reviewed: I have personally reviewed following labs and imaging studies  CBC: Recent Labs  Lab 10/22/20 1129 10/23/20 0355 10/24/20 0433  WBC 9.0 9.8 7.3  HGB 16.7 16.5 15.3  HCT 49.1 49.5 44.6  MCV 79.3* 80.0 79.6*  PLT 205 221 192   Basic Metabolic Panel: Recent Labs  Lab 10/22/20 1129 10/23/20 0355 10/24/20 0433  NA 138 137 138  K 4.1 4.2 4.0  CL 107 105 108  CO2 21* 21* 21*  GLUCOSE 100* 123* 114*  BUN 24* 28* 27*  CREATININE 1.26* 1.56* 1.44*  CALCIUM 9.2 9.4 8.5*  MG  --   --  2.3  PHOS  --   --  4.8*   GFR: Estimated Creatinine Clearance: 83.3 mL/min (A) (by C-G formula based on SCr of 1.44 mg/dL (H)). Liver Function Tests: No results for input(s): AST, ALT, ALKPHOS, BILITOT, PROT, ALBUMIN in the last 168 hours. No results for input(s): LIPASE, AMYLASE in the last 168 hours. No results for input(s): AMMONIA in the last 168 hours. Coagulation Profile: Recent Labs  Lab 10/22/20 1418  INR 1.0   Cardiac Enzymes: No results for input(s): CKTOTAL, CKMB, CKMBINDEX, TROPONINI in the last 168 hours. BNP (last 3 results) No results for input(s): PROBNP in the last 8760 hours. HbA1C: No results for input(s): HGBA1C in the last 72 hours. CBG: No results for input(s): GLUCAP in the last 168 hours. Lipid Profile: No results for input(s): CHOL, HDL, LDLCALC, TRIG, CHOLHDL, LDLDIRECT in the last 72 hours. Thyroid Function Tests: No results for input(s): TSH, T4TOTAL, FREET4, T3FREE, THYROIDAB in the last 72 hours. Anemia Panel: No results  for input(s): VITAMINB12, FOLATE, FERRITIN, TIBC, IRON, RETICCTPCT in the last 72 hours. Sepsis Labs: No results for input(s): PROCALCITON, LATICACIDVEN in the last 168 hours.  Recent Results (from the past 240 hour(s))  Resp Panel by RT-PCR (Flu A&B, Covid) Nasopharyngeal Swab     Status: None   Collection Time: 10/22/20  2:04 PM   Specimen: Nasopharyngeal Swab; Nasopharyngeal(NP) swabs in vial transport medium  Result Value Ref Range Status   SARS Coronavirus 2 by RT PCR NEGATIVE NEGATIVE Final    Comment: (NOTE) SARS-CoV-2 target nucleic acids are NOT DETECTED.  The SARS-CoV-2 RNA is generally detectable in upper respiratory specimens during the acute phase of infection. The lowest concentration of SARS-CoV-2 viral copies this assay can detect is 138 copies/mL. A negative result does not preclude SARS-Cov-2 infection and should not be used as the sole basis for treatment or other patient management decisions. A negative result may occur with  improper specimen collection/handling, submission of specimen other than nasopharyngeal swab, presence of  viral mutation(s) within the areas targeted by this assay, and inadequate number of viral copies(<138 copies/mL). A negative result must be combined with clinical observations, patient history, and epidemiological information. The expected result is Negative.  Fact Sheet for Patients:  BloggerCourse.comhttps://www.fda.gov/media/152166/download  Fact Sheet for Healthcare Providers:  SeriousBroker.ithttps://www.fda.gov/media/152162/download  This test is no t yet approved or cleared by the [place] FDA and  has been authorized for detection and/or diagnosis of SARS-CoV-2 by FDA under an Emergency Use Authorization (EUA). This EUA will remain  in effect (meaning this test can be used) for the duration of the COVID-19 declaration under Section 564(b)(1) of the Act, 21 U.S.C.section 360bbb-3(b)(1), unless the authorization is terminated  or revoked sooner.        Influenza A by PCR NEGATIVE NEGATIVE Final   Influenza B by PCR NEGATIVE NEGATIVE Final    Comment: (NOTE) The Xpert Xpress SARS-CoV-2/FLU/RSV plus assay is intended as an aid in the diagnosis of influenza from Nasopharyngeal swab specimens and should not be used as a sole basis for treatment. Nasal washings and aspirates are unacceptable for Xpert Xpress SARS-CoV-2/FLU/RSV testing.  Fact Sheet for Patients: BloggerCourse.comhttps://www.fda.gov/media/152166/download  Fact Sheet for Healthcare Providers: SeriousBroker.ithttps://www.fda.gov/media/152162/download  This test is not yet approved or cleared by the [place] FDA and has been authorized for detection and/or diagnosis of SARS-CoV-2 by FDA under an Emergency Use Authorization (EUA). This EUA will remain in effect (meaning this test can be used) for the duration of the COVID-19 declaration under Section 564(b)(1) of the Act, 21 U.S.C. section 360bbb-3(b)(1), unless the authorization is terminated or revoked.  Performed at [facility], [address] [place], [place] [address]          Radiology Studies: DG Chest 2 View  Result Date: 10/22/2020 CLINICAL DATA:  Chest tightness.  Shortness of breath. EXAM: CHEST - 2 VIEW COMPARISON:  01/04/2011. FINDINGS: Borderline cardiomegaly and pulmonary venous congestion. Low lung volumes. Mild infiltrate left upper lung cannot be excluded. No pleural effusion or pneumothorax. No acute bony abnormality. IMPRESSION: 1.  Borderline cardiomegaly and pulmonary venous congestion. 2. Low lung volumes. Mild infiltrate left upper lung cannot be excluded. Electronically Signed   By: Maisie Fushomas  Register   On: 10/22/2020 12:32   CT Angio Chest PE W and/or Wo Contrast  Result Date: 10/22/2020 CLINICAL DATA:  Shortness of breath and chest tightness for 2 days. EXAM: CT ANGIOGRAPHY CHEST WITH CONTRAST TECHNIQUE: Multidetector CT imaging of the chest was performed using the standard protocol during bolus administration  of intravenous contrast. Multiplanar CT image reconstructions and MIPs were obtained to evaluate the vascular anatomy. CONTRAST:  75 mL OMNIPAQUE IOHEXOL 350 MG/ML SOLN COMPARISON:  PA and lateral chest 10/22/2020. FINDINGS: Cardiovascular: A large embolus is seen in the right main pulmonary artery. Emboli are also seen in branches of the left main pulmonary artery. The RV to LV ratio is 1.27 consistent with right heart strain. There is bowing of the interventricular septum into the left ventricle. Heart size is normal. No pericardial effusion. Mediastinum/Nodes: No enlarged mediastinal, hilar, or axillary lymph nodes. Thyroid gland, trachea, and esophagus demonstrate no significant findings. Lungs/Pleura: No pleural effusion. There is ground-glass attenuating airspace disease in the left upper lobe. The lungs are otherwise clear. Upper Abdomen: Marked fatty infiltration of the liver. Otherwise negative. Musculoskeletal: No acute or focal abnormality. Review of the MIP images confirms the above findings. IMPRESSION: The examination is positive for bilateral pulmonary emboli with a large clot burden and findings consistent with  right heart strain. Hazy airspace opacity in left upper lobe is most worrisome for pneumonia rather than pulmonary infarct. Critical Value/emergent results were called by telephone at the time of interpretation on 10/22/2020 at 2:08 pm to provider [facility] , who verbally acknowledged these results. Electronically Signed   By: Drusilla Kanner M.D.   On: 10/22/2020 14:09   PERIPHERAL VASCULAR CATHETERIZATION  Result Date: 10/23/2020 See op note  US Venous Img Lower Bilateral  Result Date: 10/22/2020 CLINICAL DATA:  Left calf pain and cramping. EXAM: Right LOWER EXTREMITY VENOUS DOPPLER ULTRASOUND TECHNIQUE: Gray-scale sonography with graded compression, as well as color Doppler and duplex ultrasound were performed to evaluate the lower extremity deep venous systems from the  level of the common femoral vein and including the common femoral, femoral, profunda femoral, popliteal and calf veins including the posterior tibial, peroneal and gastrocnemius veins when visible. The superficial great saphenous vein was also interrogated. Spectral Doppler was utilized to evaluate flow at rest and with distal augmentation maneuvers in the common femoral, femoral and popliteal veins. COMPARISON:  None. FINDINGS: Contralateral Common Femoral Vein: Respiratory phasicity is normal and symmetric with the symptomatic side. No evidence of thrombus. Normal compressibility. Common Femoral Vein: No evidence of thrombus. Normal compressibility, respiratory phasicity and response to augmentation. Saphenofemoral Junction: No evidence of thrombus. Normal compressibility and flow on color Doppler imaging. Profunda Femoral Vein: No evidence of thrombus. Normal compressibility and flow on color Doppler imaging. Femoral Vein: No evidence of thrombus. Normal compressibility, respiratory phasicity and response to augmentation. Popliteal Vein: There is nonocclusive partially compressible thrombus in the popliteal vein. Calf Veins: No evidence of thrombus. Normal compressibility and flow on color Doppler imaging. Superficial Great Saphenous Vein: No evidence of thrombus. Normal compressibility. Venous Reflux:  None. Other Findings:  None. IMPRESSION: Nonocclusive partially compressive thrombus in the right popliteal vein. These results will be called to the ordering clinician or representative by the Radiologist Assistant, and communication documented in the PACS or [employer]. Electronically Signed   By: Rudie Meyer M.D.   On: 10/22/2020 15:04   ECHOCARDIOGRAM COMPLETE  Result Date: 10/23/2020    ECHOCARDIOGRAM REPORT   Patient Name:   BEXTON HAAK Date of Exam: 10/23/2020 Medical Rec #:  [id number]      Height:       70.5 in Accession #:    [id number]     Weight:       279.3 lb Date of Birth:  [birth year]      BSA:          2.418 m Patient Age:    49 years       BP:           100/74 mmHg Patient Gender: M              HR:           89 bpm. Exam Location:  [facility] Procedure: 2D Echo, Color Doppler, Cardiac Doppler, Intracardiac Opacification            Agent and Strain Analysis Indications:     R06.00 Dyspnea; I26.09 Pulmonary Embolus  History:         Patient has no prior history of Echocardiogram examinations.                  CKD; Risk Factors:Sleep Apnea and HCL.  Sonographer:     Humphrey Rolls RDCS (AE) Referring Phys:  [id number] AMY N COX Diagnosing Phys: Yvonne Kendall MD  Sonographer Comments:  Technically difficult study due to poor echo windows. Image acquisition challenging due to patient body habitus. IMPRESSIONS  1. Left ventricular ejection fraction, by estimation, is 65 to 70%. The left ventricle has normal function. Left ventricular endocardial border not optimally defined to evaluate regional wall motion. There is mild left ventricular hypertrophy. Left ventricular diastolic parameters are consistent with Grade I diastolic dysfunction (impaired relaxation). There is the interventricular septum is flattened in systole, consistent with right ventricular pressure overload. The average left ventricular global longitudinal strain is -13.1 %. The global longitudinal strain is abnormal.  2. Right ventricular systolic function is moderately reduced. The right ventricular size is mildly enlarged. Mildly increased right ventricular wall thickness.  3. The mitral valve is normal in structure. No evidence of mitral valve regurgitation. No evidence of mitral stenosis.  4. The aortic valve has an indeterminant number of cusps. Aortic valve regurgitation is not visualized. No aortic stenosis is present. FINDINGS  Left Ventricle: Left ventricular ejection fraction, by estimation, is 65 to 70%. The left ventricle has normal function. Left ventricular endocardial border not optimally defined to evaluate regional wall motion.  Definity contrast agent was given IV to delineate the left ventricular endocardial borders. The average left ventricular global longitudinal strain is -13.1 %. The global longitudinal strain is abnormal. The left ventricular internal cavity size was normal in size. There is mild left ventricular hypertrophy. The interventricular septum is flattened in systole, consistent with right ventricular pressure overload. Left ventricular diastolic parameters are consistent with Grade I diastolic dysfunction (impaired relaxation). Right Ventricle: The right ventricular size is mildly enlarged. Mildly increased right ventricular wall thickness. Right ventricular systolic function is moderately reduced. Left Atrium: Left atrial size was normal in size. Right Atrium: Right atrial size was normal in size. Pericardium: The pericardium was not well visualized. Mitral Valve: The mitral valve is normal in structure. No evidence of mitral valve regurgitation. No evidence of mitral valve stenosis. MV peak gradient, 2.0 mmHg. The mean mitral valve gradient is 1.0 mmHg. Tricuspid Valve: The tricuspid valve is normal in structure. Tricuspid valve regurgitation is trivial. Aortic Valve: The aortic valve has an indeterminant number of cusps. Aortic valve regurgitation is not visualized. No aortic stenosis is present. Aortic valve mean gradient measures 2.0 mmHg. Aortic valve peak gradient measures 3.4 mmHg. Aortic valve area, by VTI measures 2.88 cm. Pulmonic Valve: The pulmonic valve was not well visualized. Pulmonic valve regurgitation is not visualized. No evidence of pulmonic stenosis. Aorta: The aortic root is normal in size and structure. Pulmonary Artery: The pulmonary artery is not well seen. IAS/Shunts: The interatrial septum was not well visualized.  LEFT VENTRICLE PLAX 2D LVIDd:         3.60 cm  Diastology LVIDs:         2.20 cm  LV e' medial:    5.98 cm/s LV PW:         1.00 cm  LV E/e' medial:  7.4 LV IVS:        1.20 cm  LV  e' lateral:   11.50 cm/s LVOT diam:     2.10 cm  LV E/e' lateral: 3.8 LV SV:         33 LV SV Index:   14       2D Longitudinal Strain LVOT Area:     3.46 cm 2D Strain GLS Avg:     -13.1 %  RIGHT VENTRICLE RV Basal diam:  3.30 cm LEFT ATRIUM  Index      RIGHT ATRIUM           Index LA diam:      2.80 cm 1.16 cm/m RA Area:     16.00 cm LA Vol (A4C): 18.1 ml 7.48 ml/m RA Volume:   40.10 ml  16.58 ml/m  AORTIC VALVE                   PULMONIC VALVE AV Area (Vmax):    2.44 cm    PV Vmax:       0.65 m/s AV Area (Vmean):   2.25 cm    PV Vmean:      37.500 cm/s AV Area (VTI):     2.88 cm    PV VTI:        0.085 m AV Vmax:           91.80 cm/s  PV Peak grad:  1.7 mmHg AV Vmean:          62.900 cm/s PV Mean grad:  1.0 mmHg AV VTI:            0.115 m AV Peak Grad:      3.4 mmHg AV Mean Grad:      2.0 mmHg LVOT Vmax:         64.60 cm/s LVOT Vmean:        40.900 cm/s LVOT VTI:          0.096 m LVOT/AV VTI ratio: 0.83  AORTA Ao Root diam: 3.50 cm MITRAL VALVE MV Area (PHT): 4.74 cm    SHUNTS MV Area VTI:   2.40 cm    Systemic VTI:  0.10 m MV Peak grad:  2.0 mmHg    Systemic Diam: 2.10 cm MV Mean grad:  1.0 mmHg MV Vmax:       0.71 m/s MV Vmean:      40.5 cm/s MV Decel Time: 160 msec MV E velocity: 44.10 cm/s MV A velocity: 70.30 cm/s MV E/A ratio:  0.63 Cristal Deer End MD Electronically signed by Yvonne Kendall MD Signature Date/Time: 10/23/2020/4:30:32 PM    Final         Scheduled Meds: . cholecalciferol  2,000 Units Oral Daily  . montelukast  10 mg Oral QHS  . multivitamin with minerals  1 tablet Oral Daily  . omega-3 acid ethyl esters  1 g Oral Daily  . pantoprazole  80 mg Oral BID AC  . simvastatin  10 mg Oral q1800  . vitamin B-12  100 mcg Oral Daily   Continuous Infusions: . sodium chloride 75 mL/hr at 10/24/20 0728  . heparin 1,650 Units/hr (10/23/20 1730)     LOS: 1 day    Time spent: 25 mins. More than 50% of that time was spent in counseling and/or coordination of  care.      Burnadette Pop, MD [facility] P3/07/2021, 10:23 AM

## 2020-10-25 LAB — BASIC METABOLIC PANEL
Anion gap: 4 — ABNORMAL LOW (ref 5–15)
BUN: 21 mg/dL — ABNORMAL HIGH (ref 6–20)
CO2: 21 mmol/L — ABNORMAL LOW (ref 22–32)
Calcium: 8.4 mg/dL — ABNORMAL LOW (ref 8.9–10.3)
Chloride: 110 mmol/L (ref 98–111)
Creatinine, Ser: 1.13 mg/dL (ref 0.61–1.24)
GFR, Estimated: >60 mL/min (ref >60)
Glucose, Bld: 121 mg/dL — ABNORMAL HIGH (ref 70–99)
Potassium: 4 mmol/L (ref 3.5–5.1)
Sodium: 135 mmol/L (ref 135–145)

## 2020-10-25 MED ORDER — APIXABAN 5 MG PO TABS: 10.0000 mg | ORAL_TABLET | Freq: Two times a day (BID) | ORAL | 0 refills | Status: DC

## 2020-10-25 MED ORDER — APIXABAN 5 MG PO TABS: 5.0000 mg | ORAL_TABLET | Freq: Two times a day (BID) | ORAL | 0 refills | Status: DC

## 2020-10-25 NOTE — TOC Transition Note (Addendum)
Transition of Care [facility]) - CM/SW Discharge Note   Patient Details  Name: Christopher Burns MRN: [id number] Date of Birth: [birth year]  [facility]) CM/SW Contact:  Bing Quarry, RN Phone Number: 10/25/2020, 11:25 AM   Clinical Narrative:   Patient being discharged today home/self care. Eliquis Coupon delivered to Unit RN prior to discharge per [facility] consult request by provider. Gabriel Cirri RN CM.    Final next level of care: Home/Self Care Barriers to Discharge: Barriers Resolved   Patient Goals and CMS Choice        Discharge Placement                       Discharge Plan and Services                DME Arranged: N/A DME Agency: NA       HH Arranged: NA HH Agency: NA        Social Determinants of Health (SDOH) Interventions     Readmission Risk Interventions No flowsheet data found.

## 2020-10-25 NOTE — Discharge Summary (Signed)
Physician Discharge Summary  Christopher Burns ALP:[id number] DOB: 04-27-1971 DOA: 10/22/2020  PCP: Corky Downs, MD  Admit date: 10/22/2020 Discharge date: 10/25/2020  Admitted From: Home Disposition:  Home  Discharge Condition:Stable CODE STATUS:FULL Diet recommendation: Heart Healthy   Brief/Interim Summary:  Patient is a 50 year old male with history of polycythemia, testosterone replacement therapy for hypogonadism, hyperlipidemia, OSA on CPAP, hypertension, GERD who presented to the emergency department after referral from his primary care physician for the evaluation of chest tightness and shortness of breath.  CTA done in the emergency department showed bilateral pulmonary embolism.  He was also in acute hypoxic respiratory failure requiring 3 L of oxygen presentation.  Troponins were mild elevated.  Started on heparin drip.  Vascular surgery consulted and he underwent thrombolysis and suction thrombectomy on on 10/23/20, anticoagulation switched to Eliquis.  He has been mobilized well.  He is medically stable for discharge to home today.  Following problems were addressed during his hospitalization:  Bilateral pulmonary embolism: Presented with chest pain, shortness of breath.  CT done in the emergency department showed bilateral PE.  Vascular surgery consulted thrombectomy.  He was initially started on heparin drip.  Changed  to Eliquis . He was taking testosterone which could have contributed also. vascular surgery recommended hypercoagulable work-up as an outpatient.  He will follow-up with vascular surgery as an outpatient. Ultrasound of the lower extremities showed nonocclusive partially compressible thrombus in the right popliteal vein. Echo showed ejection fraction of 65 to 70%, grade 1 diastolic dysfunction, right ventricular pressure overload, moderately reduced right ventricular systolic function.  OSA: On CPAP at home.  Hypertension: Currently blood pressure soft.    Denies  any lightheadedness/dizziness .he takes lisinopril, hydrochlorothiazide at home, will stop the antihypertensives because his blood pressure is soft.  We have recommended him to monitor blood pressure at home.  AKI versus CKD stage II: His creatinine was 1.4 in 07/2019.  Presented with creatinine in the range of 1.5.  Lisinopril, hydrochlorothiazide on hold.    Kidney function improved with IV fluids.  Recent history of COVID-19: Recovered  Obesity: BMI of 39.3   Discharge Diagnoses:  Principal Problem:   Pulmonary embolism (HCC) Active Problems:   Polycythemia   Mixed hyperlipidemia   Hypertension   Hypogonadism in male    Discharge Instructions  Discharge Instructions    Diet - low sodium heart healthy   Complete by: As directed    Discharge instructions   Complete by: As directed    1)Please take prescribed medications as instructed. 2)Monitor your blood pressure at home 3)Follow up with oncology as an outpatient in 4 weeks. Name and number of the provider has been attached 4)Follow up with your PCP in a week 5)Follow up with vascular surgery in 2 weeks.  Name and number of the provider has been attached.  Call for appointment   Increase activity slowly   Complete by: As directed      Allergies as of 10/25/2020      Reactions   Other Itching, Rash   Pt was tested and positive for feline, canine and cockroaches. Pt has rash, itching, watery eyes. And is on allergy injections since 2017      Medication List    STOP taking these medications   lisinopril-hydrochlorothiazide 20-12.5 MG tablet Commonly known as: ZESTORETIC   NON FORMULARY     TAKE these medications   apixaban 5 MG Tabs tablet Commonly known as: ELIQUIS Take 2 tablets (10 mg total) by mouth 2 (two)  times daily for 6 days.   apixaban 5 MG Tabs tablet Commonly known as: ELIQUIS Take 1 tablet (5 mg total) by mouth 2 (two) times daily. Start taking on: October 31, 2020   Apple Cider Vinegar 188 MG  Caps Take 188 mg by mouth daily.   cetirizine 10 MG tablet Commonly known as: ZYRTEC Take 10 mg by mouth daily.   esomeprazole 40 MG capsule Commonly known as: NEXIUM Take 40 mg by mouth daily.   montelukast 10 MG tablet Commonly known as: SINGULAIR Take 10 mg by mouth at bedtime.   multivitamin with minerals tablet Take 1 tablet by mouth daily.   omega-3 acid ethyl esters 1 g capsule Commonly known as: LOVAZA Take 1 g by mouth daily.   simvastatin 10 MG tablet Commonly known as: ZOCOR TAKE 1 TABLET BY MOUTH EVERYDAY AT BEDTIME   Ventolin HFA 108 (90 Base) MCG/ACT inhaler Generic drug: albuterol USE 2 PUFFS 4 TIMES A DAY AS NEEDED   vitamin B-12 100 MCG tablet Commonly known as: CYANOCOBALAMIN Take 100 mcg by mouth daily.   Vitamin D 50 MCG (2000 UT) tablet Take 2,000 Units by mouth daily.       Follow-up Information    Corky Downs, MD. Schedule an appointment as soon as possible for a visit in 1 week(s).   Specialties: Internal Medicine, Cardiology Contact information: [address] [place] [address] [phone]        Creig Hines, MD. Schedule an appointment as soon as possible for a visit in 4 week(s).   Specialty: Oncology Contact information: [address] [place] [address] [phone]        Annice Needy, MD. Schedule an appointment as soon as possible for a visit in 2 week(s).   Specialties: Vascular Surgery, Radiology, Interventional Cardiology Contact information: [address] [place] [address] [phone]              Allergies  Allergen Reactions  . Other Itching and Rash    Pt was tested and positive for feline, canine and cockroaches. Pt has rash, itching, watery eyes. And is on allergy injections since 2017    Consultations:  Vascular surgery   Procedures/Studies: DG Chest 2 View  Result Date: 10/22/2020 CLINICAL DATA:  Chest tightness.  Shortness of breath. EXAM: CHEST - 2 VIEW COMPARISON:   01/04/2011. FINDINGS: Borderline cardiomegaly and pulmonary venous congestion. Low lung volumes. Mild infiltrate left upper lung cannot be excluded. No pleural effusion or pneumothorax. No acute bony abnormality. IMPRESSION: 1.  Borderline cardiomegaly and pulmonary venous congestion. 2. Low lung volumes. Mild infiltrate left upper lung cannot be excluded. Electronically Signed   By: Maisie Fus  Register   On: 10/22/2020 12:32   CT Angio Chest PE W and/or Wo Contrast  Result Date: 10/22/2020 CLINICAL DATA:  Shortness of breath and chest tightness for 2 days. EXAM: CT ANGIOGRAPHY CHEST WITH CONTRAST TECHNIQUE: Multidetector CT imaging of the chest was performed using the standard protocol during bolus administration of intravenous contrast. Multiplanar CT image reconstructions and MIPs were obtained to evaluate the vascular anatomy. CONTRAST:  75 mL OMNIPAQUE IOHEXOL 350 MG/ML SOLN COMPARISON:  PA and lateral chest 10/22/2020. FINDINGS: Cardiovascular: A large embolus is seen in the right main pulmonary artery. Emboli are also seen in branches of the left main pulmonary artery. The RV to LV ratio is 1.27 consistent with right heart strain. There is bowing of the interventricular septum into the left ventricle. Heart size is normal. No  pericardial effusion. Mediastinum/Nodes: No enlarged mediastinal, hilar, or axillary lymph nodes. Thyroid gland, trachea, and esophagus demonstrate no significant findings. Lungs/Pleura: No pleural effusion. There is ground-glass attenuating airspace disease in the left upper lobe. The lungs are otherwise clear. Upper Abdomen: Marked fatty infiltration of the liver. Otherwise negative. Musculoskeletal: No acute or focal abnormality. Review of the MIP images confirms the above findings. IMPRESSION: The examination is positive for bilateral pulmonary emboli with a large clot burden and findings consistent with right heart strain. Hazy airspace opacity in left upper lobe is most worrisome  for pneumonia rather than pulmonary infarct. Critical Value/emergent results were called by telephone at the time of interpretation on 10/22/2020 at 2:08 pm to provider [facility] , who verbally acknowledged these results. Electronically Signed   By: Drusilla Kanner M.D.   On: 10/22/2020 14:09   PERIPHERAL VASCULAR CATHETERIZATION  Result Date: 10/23/2020 See op note  US Venous Img Lower Bilateral  Result Date: 10/22/2020 CLINICAL DATA:  Left calf pain and cramping. EXAM: Right LOWER EXTREMITY VENOUS DOPPLER ULTRASOUND TECHNIQUE: Gray-scale sonography with graded compression, as well as color Doppler and duplex ultrasound were performed to evaluate the lower extremity deep venous systems from the level of the common femoral vein and including the common femoral, femoral, profunda femoral, popliteal and calf veins including the posterior tibial, peroneal and gastrocnemius veins when visible. The superficial great saphenous vein was also interrogated. Spectral Doppler was utilized to evaluate flow at rest and with distal augmentation maneuvers in the common femoral, femoral and popliteal veins. COMPARISON:  None. FINDINGS: Contralateral Common Femoral Vein: Respiratory phasicity is normal and symmetric with the symptomatic side. No evidence of thrombus. Normal compressibility. Common Femoral Vein: No evidence of thrombus. Normal compressibility, respiratory phasicity and response to augmentation. Saphenofemoral Junction: No evidence of thrombus. Normal compressibility and flow on color Doppler imaging. Profunda Femoral Vein: No evidence of thrombus. Normal compressibility and flow on color Doppler imaging. Femoral Vein: No evidence of thrombus. Normal compressibility, respiratory phasicity and response to augmentation. Popliteal Vein: There is nonocclusive partially compressible thrombus in the popliteal vein. Calf Veins: No evidence of thrombus. Normal compressibility and flow on color Doppler imaging.  Superficial Great Saphenous Vein: No evidence of thrombus. Normal compressibility. Venous Reflux:  None. Other Findings:  None. IMPRESSION: Nonocclusive partially compressive thrombus in the right popliteal vein. These results will be called to the ordering clinician or representative by the Radiologist Assistant, and communication documented in the PACS or [employer]. Electronically Signed   By: Rudie Meyer M.D.   On: 10/22/2020 15:04   ECHOCARDIOGRAM COMPLETE  Result Date: 10/23/2020    ECHOCARDIOGRAM REPORT   Patient Name:   Christopher Burns Date of Exam: 10/23/2020 Medical Rec #:  [id number]      Height:       70.5 in Accession #:    [id number]     Weight:       279.3 lb Date of Birth:  Jun 24, 1971     BSA:          2.418 m Patient Age:    49 years       BP:           100/74 mmHg Patient Gender: M              HR:           89 bpm. Exam Location:  [facility] Procedure: 2D Echo, Color Doppler, Cardiac Doppler, Intracardiac Opacification  Agent and Strain Analysis Indications:     R06.00 Dyspnea; I26.09 Pulmonary Embolus  History:         Patient has no prior history of Echocardiogram examinations.                  CKD; Risk Factors:Sleep Apnea and HCL.  Sonographer:     Humphrey Rolls RDCS (AE) Referring Phys:  [id number] AMY N [facility] Diagnosing Phys: Cristal Deer End MD  Sonographer Comments: Technically difficult study due to poor echo windows. Image acquisition challenging due to patient body habitus. IMPRESSIONS  1. Left ventricular ejection fraction, by estimation, is 65 to 70%. The left ventricle has normal function. Left ventricular endocardial border not optimally defined to evaluate regional wall motion. There is mild left ventricular hypertrophy. Left ventricular diastolic parameters are consistent with Grade I diastolic dysfunction (impaired relaxation). There is the interventricular septum is flattened in systole, consistent with right ventricular pressure overload. The average left ventricular  global longitudinal strain is -13.1 %. The global longitudinal strain is abnormal.  2. Right ventricular systolic function is moderately reduced. The right ventricular size is mildly enlarged. Mildly increased right ventricular wall thickness.  3. The mitral valve is normal in structure. No evidence of mitral valve regurgitation. No evidence of mitral stenosis.  4. The aortic valve has an indeterminant number of cusps. Aortic valve regurgitation is not visualized. No aortic stenosis is present. FINDINGS  Left Ventricle: Left ventricular ejection fraction, by estimation, is 65 to 70%. The left ventricle has normal function. Left ventricular endocardial border not optimally defined to evaluate regional wall motion. Definity contrast agent was given IV to delineate the left ventricular endocardial borders. The average left ventricular global longitudinal strain is -13.1 %. The global longitudinal strain is abnormal. The left ventricular internal cavity size was normal in size. There is mild left ventricular hypertrophy. The interventricular septum is flattened in systole, consistent with right ventricular pressure overload. Left ventricular diastolic parameters are consistent with Grade I diastolic dysfunction (impaired relaxation). Right Ventricle: The right ventricular size is mildly enlarged. Mildly increased right ventricular wall thickness. Right ventricular systolic function is moderately reduced. Left Atrium: Left atrial size was normal in size. Right Atrium: Right atrial size was normal in size. Pericardium: The pericardium was not well visualized. Mitral Valve: The mitral valve is normal in structure. No evidence of mitral valve regurgitation. No evidence of mitral valve stenosis. MV peak gradient, 2.0 mmHg. The mean mitral valve gradient is 1.0 mmHg. Tricuspid Valve: The tricuspid valve is normal in structure. Tricuspid valve regurgitation is trivial. Aortic Valve: The aortic valve has an indeterminant number  of cusps. Aortic valve regurgitation is not visualized. No aortic stenosis is present. Aortic valve mean gradient measures 2.0 mmHg. Aortic valve peak gradient measures 3.4 mmHg. Aortic valve area, by VTI measures 2.88 cm. Pulmonic Valve: The pulmonic valve was not well visualized. Pulmonic valve regurgitation is not visualized. No evidence of pulmonic stenosis. Aorta: The aortic root is normal in size and structure. Pulmonary Artery: The pulmonary artery is not well seen. IAS/Shunts: The interatrial septum was not well visualized.  LEFT VENTRICLE PLAX 2D LVIDd:         3.60 cm  Diastology LVIDs:         2.20 cm  LV e' medial:    5.98 cm/s LV PW:         1.00 cm  LV E/e' medial:  7.4 LV IVS:        1.20 cm  LV e' lateral:   11.50 cm/s LVOT diam:     2.10 cm  LV E/e' lateral: 3.8 LV SV:         33 LV SV Index:   14       2D Longitudinal Strain LVOT Area:     3.46 cm 2D Strain GLS Avg:     -13.1 %  RIGHT VENTRICLE RV Basal diam:  3.30 cm LEFT ATRIUM           Index      RIGHT ATRIUM           Index LA diam:      2.80 cm 1.16 cm/m RA Area:     16.00 cm LA Vol (A4C): 18.1 ml 7.48 ml/m RA Volume:   40.10 ml  16.58 ml/m  AORTIC VALVE                   PULMONIC VALVE AV Area (Vmax):    2.44 cm    PV Vmax:       0.65 m/s AV Area (Vmean):   2.25 cm    PV Vmean:      37.500 cm/s AV Area (VTI):     2.88 cm    PV VTI:        0.085 m AV Vmax:           91.80 cm/s  PV Peak grad:  1.7 mmHg AV Vmean:          62.900 cm/s PV Mean grad:  1.0 mmHg AV VTI:            0.115 m AV Peak Grad:      3.4 mmHg AV Mean Grad:      2.0 mmHg LVOT Vmax:         64.60 cm/s LVOT Vmean:        40.900 cm/s LVOT VTI:          0.096 m LVOT/AV VTI ratio: 0.83  AORTA Ao Root diam: 3.50 cm MITRAL VALVE MV Area (PHT): 4.74 cm    SHUNTS MV Area VTI:   2.40 cm    Systemic VTI:  0.10 m MV Peak grad:  2.0 mmHg    Systemic Diam: 2.10 cm MV Mean grad:  1.0 mmHg MV Vmax:       0.71 m/s MV Vmean:      40.5 cm/s MV Decel Time: 160 msec MV E velocity: 44.10  cm/s MV A velocity: 70.30 cm/s MV E/A ratio:  0.63 Cristal Deerhristopher End MD Electronically signed by Yvonne Kendallhristopher End MD Signature Date/Time: 10/23/2020/4:30:32 PM    Final        Subjective: Patient seen and examined the bedside this morning.  Hemodynamically stable for discharge.  Discharge Exam: Vitals:   10/24/20 1958 10/25/20 0357  BP: 118/76 (!) 87/61  Pulse: 86 79  Resp: 18 18  Temp: 98.1 F (36.7 C) 98.3 F (36.8 C)  SpO2: 99% 95%   Vitals:   10/24/20 1150 10/24/20 1701 10/24/20 1958 10/25/20 0357  BP: 92/65 111/74 118/76 (!) 87/61  Pulse: 85 85 86 79  Resp:   18 18  Temp: 98.6 F (37 C) 97.8 F (36.6 C) 98.1 F (36.7 C) 98.3 F (36.8 C)  TempSrc: Oral Oral Oral Oral  SpO2: 94% 97% 99% 95%  Weight:    130.2 kg  Height:        General: Pt is alert, awake, not in acute distress Cardiovascular: RRR, S1/S2 +, no rubs,  no gallops Respiratory: CTA bilaterally, no wheezing, no rhonchi Abdominal: Soft, NT, ND, bowel sounds + Extremities: no edema, no cyanosis    The results of significant diagnostics from this hospitalization (including imaging, microbiology, ancillary and laboratory) are listed below for reference.     Microbiology: Recent Results (from the past 240 hour(s))  Resp Panel by RT-PCR (Flu A&B, Covid) Nasopharyngeal Swab     Status: None   Collection Time: 10/22/20  2:04 PM   Specimen: Nasopharyngeal Swab; Nasopharyngeal(NP) swabs in vial transport medium  Result Value Ref Range Status   SARS Coronavirus 2 by RT PCR NEGATIVE NEGATIVE Final    Comment: (NOTE) SARS-CoV-2 target nucleic acids are NOT DETECTED.  The SARS-CoV-2 RNA is generally detectable in upper respiratory specimens during the acute phase of infection. The lowest concentration of SARS-CoV-2 viral copies this assay can detect is 138 copies/mL. A negative result does not preclude SARS-Cov-2 infection and should not be used as the sole basis for treatment or other patient management  decisions. A negative result may occur with  improper specimen collection/handling, submission of specimen other than nasopharyngeal swab, presence of viral mutation(s) within the areas targeted by this assay, and inadequate number of viral copies(<138 copies/mL). A negative result must be combined with clinical observations, patient history, and epidemiological information. The expected result is Negative.  Fact Sheet for Patients:  BloggerCourse.com  Fact Sheet for Healthcare Providers:  SeriousBroker.it  This test is no t yet approved or cleared by the [place] FDA and  has been authorized for detection and/or diagnosis of SARS-CoV-2 by FDA under an Emergency Use Authorization (EUA). This EUA will remain  in effect (meaning this test can be used) for the duration of the COVID-19 declaration under Section 564(b)(1) of the Act, 21 U.S.C.section 360bbb-3(b)(1), unless the authorization is terminated  or revoked sooner.       Influenza A by PCR NEGATIVE NEGATIVE Final   Influenza B by PCR NEGATIVE NEGATIVE Final    Comment: (NOTE) The Xpert Xpress SARS-CoV-2/FLU/RSV plus assay is intended as an aid in the diagnosis of influenza from Nasopharyngeal swab specimens and should not be used as a sole basis for treatment. Nasal washings and aspirates are unacceptable for Xpert Xpress SARS-CoV-2/FLU/RSV testing.  Fact Sheet for Patients: BloggerCourse.com  Fact Sheet for Healthcare Providers: SeriousBroker.it  This test is not yet approved or cleared by the [place] FDA and has been authorized for detection and/or diagnosis of SARS-CoV-2 by FDA under an Emergency Use Authorization (EUA). This EUA will remain in effect (meaning this test can be used) for the duration of the COVID-19 declaration under Section 564(b)(1) of the Act, 21 U.S.C. section 360bbb-3(b)(1), unless the  authorization is terminated or revoked.  Performed at [facility], [address] [place], [place] [address]      Labs: BNP (last 3 results) No results for input(s): BNP in the last 8760 hours. Basic Metabolic Panel: Recent Labs  Lab 10/22/20 1129 10/23/20 0355 10/24/20 0433 10/25/20 0404  NA 138 137 138 135  K 4.1 4.2 4.0 4.0  CL 107 105 108 110  CO2 21* 21* 21* 21*  GLUCOSE 100* 123* 114* 121*  BUN 24* 28* 27* 21*  CREATININE 1.26* 1.56* 1.44* 1.13  CALCIUM 9.2 9.4 8.5* 8.4*  MG  --   --  2.3  --   PHOS  --   --  4.8*  --    Liver Function Tests: No results for input(s): AST, ALT, ALKPHOS, BILITOT,  PROT, ALBUMIN in the last 168 hours. No results for input(s): LIPASE, AMYLASE in the last 168 hours. No results for input(s): AMMONIA in the last 168 hours. CBC: Recent Labs  Lab 10/22/20 1129 10/23/20 0355 10/24/20 0433  WBC 9.0 9.8 7.3  HGB 16.7 16.5 15.3  HCT 49.1 49.5 44.6  MCV 79.3* 80.0 79.6*  PLT 205 221 192   Cardiac Enzymes: No results for input(s): CKTOTAL, CKMB, CKMBINDEX, TROPONINI in the last 168 hours. BNP: Invalid input(s): POCBNP CBG: No results for input(s): GLUCAP in the last 168 hours. D-Dimer No results for input(s): DDIMER in the last 72 hours. Hgb A1c No results for input(s): HGBA1C in the last 72 hours. Lipid Profile No results for input(s): CHOL, HDL, LDLCALC, TRIG, CHOLHDL, LDLDIRECT in the last 72 hours. Thyroid function studies No results for input(s): TSH, T4TOTAL, T3FREE, THYROIDAB in the last 72 hours.  Invalid input(s): FREET3 Anemia work up No results for input(s): VITAMINB12, FOLATE, FERRITIN, TIBC, IRON, RETICCTPCT in the last 72 hours. Urinalysis    Component Value Date/Time   COLORURINE YELLOW (A) 07/29/2019 1041   APPEARANCEUR CLEAR (A) 07/29/2019 1041   LABSPEC 1.016 07/29/2019 1041   PHURINE 6.0 07/29/2019 1041   GLUCOSEU NEGATIVE 07/29/2019 1041   HGBUR NEGATIVE 07/29/2019 1041   BILIRUBINUR  NEGATIVE 07/29/2019 1041   KETONESUR NEGATIVE 07/29/2019 1041   PROTEINUR NEGATIVE 07/29/2019 1041   NITRITE NEGATIVE 07/29/2019 1041   LEUKOCYTESUR NEGATIVE 07/29/2019 1041   Sepsis Labs Invalid input(s): PROCALCITONIN,  WBC,  LACTICIDVEN Microbiology Recent Results (from the past 240 hour(s))  Resp Panel by RT-PCR (Flu A&B, Covid) Nasopharyngeal Swab     Status: None   Collection Time: 10/22/20  2:04 PM   Specimen: Nasopharyngeal Swab; Nasopharyngeal(NP) swabs in vial transport medium  Result Value Ref Range Status   SARS Coronavirus 2 by RT PCR NEGATIVE NEGATIVE Final    Comment: (NOTE) SARS-CoV-2 target nucleic acids are NOT DETECTED.  The SARS-CoV-2 RNA is generally detectable in upper respiratory specimens during the acute phase of infection. The lowest concentration of SARS-CoV-2 viral copies this assay can detect is 138 copies/mL. A negative result does not preclude SARS-Cov-2 infection and should not be used as the sole basis for treatment or other patient management decisions. A negative result may occur with  improper specimen collection/handling, submission of specimen other than nasopharyngeal swab, presence of viral mutation(s) within the areas targeted by this assay, and inadequate number of viral copies(<138 copies/mL). A negative result must be combined with clinical observations, patient history, and epidemiological information. The expected result is Negative.  Fact Sheet for Patients:  BloggerCourse.com  Fact Sheet for Healthcare Providers:  SeriousBroker.it  This test is no t yet approved or cleared by the [place] FDA and  has been authorized for detection and/or diagnosis of SARS-CoV-2 by FDA under an Emergency Use Authorization (EUA). This EUA will remain  in effect (meaning this test can be used) for the duration of the COVID-19 declaration under Section 564(b)(1) of the Act, 21 U.S.C.section  360bbb-3(b)(1), unless the authorization is terminated  or revoked sooner.       Influenza A by PCR NEGATIVE NEGATIVE Final   Influenza B by PCR NEGATIVE NEGATIVE Final    Comment: (NOTE) The Xpert Xpress SARS-CoV-2/FLU/RSV plus assay is intended as an aid in the diagnosis of influenza from Nasopharyngeal swab specimens and should not be used as a sole basis for treatment. Nasal washings and aspirates are unacceptable for Xpert Xpress SARS-CoV-2/FLU/RSV testing.  Fact Sheet for Patients: BloggerCourse.com  Fact Sheet for Healthcare Providers: SeriousBroker.it  This test is not yet approved or cleared by the [place] FDA and has been authorized for detection and/or diagnosis of SARS-CoV-2 by FDA under an Emergency Use Authorization (EUA). This EUA will remain in effect (meaning this test can be used) for the duration of the COVID-19 declaration under Section 564(b)(1) of the Act, 21 U.S.C. section 360bbb-3(b)(1), unless the authorization is terminated or revoked.  Performed at [facility], [address], [place], [place] [address]     Please note: You were cared for by a hospitalist during your hospital stay. Once you are discharged, your primary care physician will handle any further medical issues. Please note that NO REFILLS for any discharge medications will be authorized once you are discharged, as it is imperative that you return to your primary care physician (or establish a relationship with a primary care physician if you do not have one) for your post hospital discharge needs so that they can reassess your need for medications and monitor your lab values.    Time coordinating discharge: 40 minutes  SIGNED:   Burnadette Pop, MD  [facility] 10/25/2020, 10:37 AM Pager [phone]  If 7PM-7AM, please contact night-coverage [email or web address] Password [email or web address]

## 2020-10-26 ENCOUNTER — Encounter: Payer: Self-pay | Admitting: Vascular Surgery

## 2020-10-28 ENCOUNTER — Other Ambulatory Visit: Payer: Self-pay | Admitting: Internal Medicine

## 2020-10-30 ENCOUNTER — Inpatient Hospital Stay: Payer: 59 | Admitting: Family Medicine

## 2020-10-30 ENCOUNTER — Ambulatory Visit (INDEPENDENT_AMBULATORY_CARE_PROVIDER_SITE_OTHER): Payer: 59 | Admitting: Family Medicine

## 2020-10-30 ENCOUNTER — Other Ambulatory Visit: Payer: Self-pay

## 2020-10-30 ENCOUNTER — Encounter: Payer: Self-pay | Admitting: Family Medicine

## 2020-10-30 VITALS — BP 116/77 | HR 63 | Ht 70.5 in | Wt 291.5 lb

## 2020-10-30 DIAGNOSIS — Z09 Encounter for follow-up examination after completed treatment for conditions other than malignant neoplasm: Secondary | ICD-10-CM | POA: Insufficient documentation

## 2020-10-30 DIAGNOSIS — Z7901 Long term (current) use of anticoagulants: Secondary | ICD-10-CM

## 2020-10-30 DIAGNOSIS — I2602 Saddle embolus of pulmonary artery with acute cor pulmonale: Secondary | ICD-10-CM

## 2020-10-30 NOTE — Progress Notes (Signed)
Established Patient Office Visit  SUBJECTIVE:  Subjective  Patient ID: Christopher Burns, male    DOB: [birth year]  Age: 50 y.o. MRN: [id number]  CC:  Chief Complaint  Patient presents with  . Hospitalization Follow-up    Patient is here for a hospital follow from a pulmonary embolism     HPI Christopher Burns is a 50 y.o. male presenting today for     Past Medical History:  Diagnosis Date  . Asthma   . Chronic kidney disease   . Complication of anesthesia    HICCUPS FOR 1 WEEK AFTER BICEP SURGERY  . Dizziness   . GERD (gastroesophageal reflux disease)   . HA (headache)   . Hemorrhoid   . High cholesterol   . Hypercholesteremia   . Polycythemia   . Sleep apnea    NO CPAP    Past Surgical History:  Procedure Laterality Date  . bisep Left 2007  . COLONOSCOPY WITH PROPOFOL N/A 03/01/2019   Procedure: COLONOSCOPY WITH PROPOFOL;  Surgeon: Wyline Mood, MD;  Location: [facility] ENDOSCOPY;  Service: Gastroenterology;  Laterality: N/A;  . KNEE SURGERY Right   . LASIK    . PULMONARY THROMBECTOMY Bilateral 10/23/2020   Procedure: PULMONARY THROMBECTOMY;  Surgeon: Annice Needy, MD;  Location: [facility] INVASIVE CV LAB;  Service: Cardiovascular;  Laterality: Bilateral;  . TONSILLECTOMY    . UMBILICAL HERNIA REPAIR N/A 07/29/2015   Procedure: HERNIA REPAIR UMBILICAL ADULT;  Surgeon: Kieth Brightly, MD;  Location: [facility] ORS;  Service: General;  Laterality: N/A;  . VASOTOMY      Family History  Problem Relation Age of Onset  . Cancer Mother        breast  . Brain cancer Mother   . High Cholesterol Mother   . Diabetes Father   . High blood pressure Father   . High Cholesterol Father   . Prostate cancer Paternal Grandfather   . Cancer Maternal Grandmother   . Cancer Maternal Grandfather     Social History   Socioeconomic History  . Marital status: Married    Spouse name: [name]  . Number of children: 1  . Years of education: 53  . Highest education level: Not on file   Occupational History    Comment: [employer]  Tobacco Use  . Smoking status: Never Smoker  . Smokeless tobacco: Never Used  Vaping Use  . Vaping Use: Never used  Substance and Sexual Activity  . Alcohol use: Not Currently  . Drug use: No  . Sexual activity: Yes  Other Topics Concern  . Not on file  Social History Narrative   Patient lives at home  with his wife [name]).  Patient works full time for [employer].   Education some college.   Right handed.   Caffeine one shot of colombian  coffee.   Social Determinants of Health   Financial Resource Strain: Not on file  Food Insecurity: Not on file  Transportation Needs: Not on file  Physical Activity: Not on file  Stress: Not on file  Social Connections: Not on file  Intimate Partner Violence: Not on file     Current Outpatient Medications:  .  apixaban (ELIQUIS) 5 MG TABS tablet, Take 2 tablets (10 mg total) by mouth 2 (two) times daily for 6 days., Disp: 24 tablet, Rfl: 0 .  apixaban (ELIQUIS) 5 MG TABS tablet, Take 1 tablet (5 mg total) by mouth 2 (two) times daily., Disp: 60 tablet, Rfl: 0 .  Apple Cider Vinegar 188 MG CAPS, Take 188 mg by mouth daily., Disp: , Rfl:  .  cetirizine (ZYRTEC) 10 MG tablet, Take 10 mg by mouth daily., Disp: , Rfl:  .  Cholecalciferol (VITAMIN D) 2000 UNITS tablet, Take 2,000 Units by mouth daily., Disp: , Rfl:  .  esomeprazole (NEXIUM) 40 MG capsule, Take 40 mg by mouth daily. , Disp: , Rfl:  .  montelukast (SINGULAIR) 10 MG tablet, Take 10 mg by mouth at bedtime. , Disp: , Rfl: 11 .  Multiple Vitamins-Minerals (MULTIVITAMIN WITH MINERALS) tablet, Take 1 tablet by mouth daily., Disp: , Rfl:  .  omega-3 acid ethyl esters (LOVAZA) 1 G capsule, Take 1 g by mouth daily., Disp: , Rfl:  .  simvastatin (ZOCOR) 10 MG tablet, TAKE 1 TABLET BY MOUTH EVERYDAY AT BEDTIME, Disp: 30 tablet, Rfl: 3 .  VENTOLIN HFA 108 (90 Base) MCG/ACT inhaler, USE 2 PUFFS 4 TIMES A DAY AS NEEDED, Disp: 3  each, Rfl: 3 .  vitamin B-12 (CYANOCOBALAMIN) 100 MCG tablet, Take 100 mcg by mouth daily., Disp: , Rfl:    Allergies  Allergen Reactions  . Other Itching and Rash    Pt was tested and positive for feline, canine and cockroaches. Pt has rash, itching, watery eyes. And is on allergy injections since 2017    ROS Review of Systems  Constitutional: Negative.   HENT: Negative.   Respiratory: Negative for cough, chest tightness, shortness of breath and wheezing.   Gastrointestinal: Negative.   Genitourinary: Negative.   Musculoskeletal: Negative.   Neurological: Negative.   Psychiatric/Behavioral: Negative.      OBJECTIVE:    Physical Exam Vitals and nursing note reviewed.  Constitutional:      Appearance: He is obese.  HENT:     Head: Normocephalic.     Mouth/Throat:     Mouth: Mucous membranes are moist.  Cardiovascular:     Rate and Rhythm: Normal rate and regular rhythm.  Pulmonary:     Effort: Pulmonary effort is normal.  Musculoskeletal:        General: Normal range of motion.  Psychiatric:        Mood and Affect: Mood normal.     BP 116/77   Pulse 63   Ht 5' 10.5" (1.791 m)   Wt 291 lb 8 oz (132.2 kg)   BMI 41.23 kg/m  Wt Readings from Last 3 Encounters:  11/06/20 290 lb 6.4 oz (131.7 kg)  11/03/20 290 lb (131.5 kg)  10/30/20 291 lb 8 oz (132.2 kg)    Health Maintenance Due  Topic Date Due  . Hepatitis C Screening  Never done  . COVID-19 Vaccine (1) Never done  . TETANUS/TDAP  Never done  . INFLUENZA VACCINE  Never done    There are no preventive care reminders to display for this patient.  CBC Latest Ref Rng & Units 10/24/2020 10/23/2020 10/22/2020  WBC 4.0 - 10.5 K/uL 7.3 9.8 9.0  Hemoglobin 13.0 - 17.0 g/dL 93.7 90.2 40.9  Hematocrit 39.0 - 52.0 % 44.6 49.5 49.1  Platelets 150 - 400 K/uL 192 221 205   CMP Latest Ref Rng & Units 10/25/2020 10/24/2020 10/23/2020  Glucose 70 - 99 mg/dL 735(H) 299(M) 426(S)  BUN 6 - 20 mg/dL 34(H) 96(Q) 22(L)   Creatinine 0.61 - 1.24 mg/dL 7.98 9.21(J) 9.41(D)  Sodium 135 - 145 mmol/L 135 138 137  Potassium 3.5 - 5.1 mmol/L 4.0 4.0 4.2  Chloride 98 - 111 mmol/L 110 108 105  CO2 22 - 32 mmol/L 21(L) 21(L) 21(L)  Calcium 8.9 - 10.3 mg/dL 9.4(B) 0.9(G) 9.4  Total Protein 6.5 - 8.1 g/dL - - -  Total Bilirubin 0.3 - 1.2 mg/dL - - -  Alkaline Phos 38 - 126 U/L - - -  AST 15 - 41 U/L - - -  ALT 0 - 44 U/L - - -    Lab Results  Component Value Date   TSH 2.720 07/21/2014   Lab Results  Component Value Date   ALBUMIN 4.3 07/29/2019   ANIONGAP 4 (L) 10/25/2020   No results found for: CHOL, HDL, LDLCALC, CHOLHDL No results found for: TRIG No results found for: HGBA1C    ASSESSMENT & PLAN:   Problem List Items Addressed This Visit      Cardiovascular and Mediastinum   Pulmonary embolism (HCC)    Patient taking Eliquis 10 mg loading dose bid currently, no sob, energy is back to 100%.        Other   Hospital discharge follow-up - Primary    Patient dc from the hospital this week after multiple PE with Vascular intervention by Dr Wyn Quaker. He was placed on loading dose 10 mg bid and will start 5 mg bid next week. He feels 100% better and is motivated to live a healthier lifestyle, lose weight and exercise. We discussed decreased carbs and intermittent fasting.       Long term current use of anticoagulant      No orders of the defined types were placed in this encounter.     Follow-up: No follow-ups on file.    Irish Lack, FNP [facility] [address] [place], [place] [address]

## 2020-10-30 NOTE — Assessment & Plan Note (Signed)
Patient taking Eliquis 10 mg loading dose bid currently, no sob, energy is back to 100%.

## 2020-10-30 NOTE — Assessment & Plan Note (Signed)
Patient dc from the hospital this week after multiple PE with Vascular intervention by Dr Wyn Quaker. He was placed on loading dose 10 mg bid and will start 5 mg bid next week. He feels 100% better and is motivated to live a healthier lifestyle, lose weight and exercise. We discussed decreased carbs and intermittent fasting.

## 2020-11-03 ENCOUNTER — Other Ambulatory Visit: Payer: Self-pay

## 2020-11-03 ENCOUNTER — Encounter (INDEPENDENT_AMBULATORY_CARE_PROVIDER_SITE_OTHER): Payer: Self-pay | Admitting: Vascular Surgery

## 2020-11-03 ENCOUNTER — Ambulatory Visit (INDEPENDENT_AMBULATORY_CARE_PROVIDER_SITE_OTHER): Payer: 59 | Admitting: Vascular Surgery

## 2020-11-03 VITALS — BP 127/81 | HR 81 | Ht 71.0 in | Wt 290.0 lb

## 2020-11-03 DIAGNOSIS — R141 Gas pain: Secondary | ICD-10-CM | POA: Insufficient documentation

## 2020-11-03 DIAGNOSIS — D751 Secondary polycythemia: Secondary | ICD-10-CM | POA: Diagnosis not present

## 2020-11-03 DIAGNOSIS — I2602 Saddle embolus of pulmonary artery with acute cor pulmonale: Secondary | ICD-10-CM | POA: Diagnosis not present

## 2020-11-03 DIAGNOSIS — K219 Gastro-esophageal reflux disease without esophagitis: Secondary | ICD-10-CM | POA: Insufficient documentation

## 2020-11-03 DIAGNOSIS — K6289 Other specified diseases of anus and rectum: Secondary | ICD-10-CM | POA: Insufficient documentation

## 2020-11-03 DIAGNOSIS — I10 Essential (primary) hypertension: Secondary | ICD-10-CM | POA: Diagnosis not present

## 2020-11-03 DIAGNOSIS — K645 Perianal venous thrombosis: Secondary | ICD-10-CM | POA: Insufficient documentation

## 2020-11-03 DIAGNOSIS — R066 Hiccough: Secondary | ICD-10-CM | POA: Insufficient documentation

## 2020-11-03 NOTE — Assessment & Plan Note (Signed)
Status post pulmonary thrombectomy now with doing reasonably well.  No significant residual chest pain and only mild shortness of breath with exertion.  This is a marked improvement from his state prior to thrombectomy.  Continue anticoagulation for a minimum of 6 months.  In 6 months, I would consider changing him to half dose Eliquis for long-term therapy.  We can see him back at that time to discuss.  He is also seeing hematology and their input will be appreciated on this as well.

## 2020-11-03 NOTE — Assessment & Plan Note (Signed)
Was already seeing hematology for this.

## 2020-11-03 NOTE — Assessment & Plan Note (Signed)
blood pressure control important in reducing the progression of atherosclerotic disease. On appropriate oral medications.  

## 2020-11-03 NOTE — Progress Notes (Signed)
MRN : [id number]  Christopher Burns is a 50 y.o. (06-Jul-1971) male who presents with chief complaint of  Chief Complaint  Patient presents with  . Follow-up    2 wk [facility]  post pulmonary thrombectomy  no studies   .  History of Present Illness: Patient returns today in follow up of his recent DVT and pulmonary embolus.  He is about 2 weeks status post pulmonary thrombectomy for submassive pulmonary embolus.  He says he is about 95% recovered and his breathing and chest pain are markedly improved from where he was prior to the procedure.  He noticed significant improvement almost immediately.  He does still have some residual lower extremity DVT although the amount was relatively small.  He is not really having any leg pain or swelling currently.  He is tolerating Eliquis quite well without any bleeding issues.  He is scheduled to see hematology next week.  Current Outpatient Medications  Medication Sig Dispense Refill  . apixaban (ELIQUIS) 5 MG TABS tablet Take 1 tablet (5 mg total) by mouth 2 (two) times daily. 60 tablet 0  . Apple Cider Vinegar 188 MG CAPS Take 188 mg by mouth daily.    . cetirizine (ZYRTEC) 10 MG tablet Take 10 mg by mouth daily.    . Cholecalciferol (VITAMIN D) 2000 UNITS tablet Take 2,000 Units by mouth daily.    Marland Kitchen esomeprazole (NEXIUM) 40 MG capsule Take 40 mg by mouth daily.     . montelukast (SINGULAIR) 10 MG tablet Take 10 mg by mouth at bedtime.   11  . Multiple Vitamins-Minerals (MULTIVITAMIN WITH MINERALS) tablet Take 1 tablet by mouth daily.    Marland Kitchen omega-3 acid ethyl esters (LOVAZA) 1 G capsule Take 1 g by mouth daily.    . simvastatin (ZOCOR) 10 MG tablet TAKE 1 TABLET BY MOUTH EVERYDAY AT BEDTIME 30 tablet 3  . VENTOLIN HFA 108 (90 Base) MCG/ACT inhaler USE 2 PUFFS 4 TIMES A DAY AS NEEDED 3 each 3  . vitamin B-12 (CYANOCOBALAMIN) 100 MCG tablet Take 100 mcg by mouth daily.    Marland Kitchen apixaban (ELIQUIS) 5 MG TABS tablet Take 2 tablets (10 mg total) by mouth 2 (two)  times daily for 6 days. 24 tablet 0   No current facility-administered medications for this visit.    Past Medical History:  Diagnosis Date  . Asthma   . Chronic kidney disease   . Complication of anesthesia    HICCUPS FOR 1 WEEK AFTER BICEP SURGERY  . Dizziness   . GERD (gastroesophageal reflux disease)   . HA (headache)   . Hemorrhoid   . High cholesterol   . Hypercholesteremia   . Polycythemia   . Sleep apnea    NO CPAP    Past Surgical History:  Procedure Laterality Date  . bisep Left 2007  . COLONOSCOPY WITH PROPOFOL N/A 03/01/2019   Procedure: COLONOSCOPY WITH PROPOFOL;  Surgeon: Wyline Mood, MD;  Location: [facility] ENDOSCOPY;  Service: Gastroenterology;  Laterality: N/A;  . KNEE SURGERY Right   . LASIK    . PULMONARY THROMBECTOMY Bilateral 10/23/2020   Procedure: PULMONARY THROMBECTOMY;  Surgeon: Annice Needy, MD;  Location: [facility] INVASIVE CV LAB;  Service: Cardiovascular;  Laterality: Bilateral;  . TONSILLECTOMY    . UMBILICAL HERNIA REPAIR N/A 07/29/2015   Procedure: HERNIA REPAIR UMBILICAL ADULT;  Surgeon: Kieth Brightly, MD;  Location: [facility] ORS;  Service: General;  Laterality: N/A;  . VASOTOMY       Social History  Tobacco Use  . Smoking status: Never Smoker  . Smokeless tobacco: Never Used  Vaping Use  . Vaping Use: Never used  Substance Use Topics  . Alcohol use: Not Currently  . Drug use: No     Family History  Problem Relation Age of Onset  . Cancer Mother        breast  . Brain cancer Mother   . High Cholesterol Mother   . Diabetes Father   . High blood pressure Father   . High Cholesterol Father   . Prostate cancer Paternal Grandfather   . Cancer Maternal Grandmother   . Cancer Maternal Grandfather     Allergies  Allergen Reactions  . Other Itching and Rash    Pt was tested and positive for feline, canine and cockroaches. Pt has rash, itching, watery eyes. And is on allergy injections since 2017     REVIEW OF SYSTEMS (Negative  unless checked)  Constitutional: [] Weight loss  [] Fever  [] Chills Cardiac: [] Chest pain   [] Chest pressure   [] Palpitations   [] Shortness of breath when laying flat   [] Shortness of breath at rest   [x] Shortness of breath with exertion. Vascular:  [] Pain in legs with walking   [] Pain in legs at rest   [] Pain in legs when laying flat   [] Claudication   [] Pain in feet when walking  [] Pain in feet at rest  [] Pain in feet when laying flat   [x] History of DVT   [x] Phlebitis   [] Swelling in legs   [] Varicose veins   [] Non-healing ulcers Pulmonary:   [] Uses home oxygen   [] Productive cough   [] Hemoptysis   [] Wheeze  [] COPD   [] Asthma Neurologic:  [] Dizziness  [] Blackouts   [] Seizures   [] History of stroke   [] History of TIA  [] Aphasia   [] Temporary blindness   [] Dysphagia   [] Weakness or numbness in arms   [] Weakness or numbness in legs Musculoskeletal:  [] Arthritis   [] Joint swelling   [] Joint pain   [] Low back pain Hematologic:  [] Easy bruising  [] Easy bleeding   [] Hypercoagulable state   [] Anemic   Gastrointestinal:  [] Blood in stool   [] Vomiting blood  [] Gastroesophageal reflux/heartburn   [] Abdominal pain Genitourinary:  [] Chronic kidney disease   [] Difficult urination  [] Frequent urination  [] Burning with urination   [] Hematuria Skin:  [] Rashes   [] Ulcers   [] Wounds Psychological:  [] History of anxiety   []  History of major depression.  Physical Examination  BP 127/81   Pulse 81   Ht 5\' 11"  (1.803 m)   Wt 290 lb (131.5 kg)   BMI 40.45 kg/m  Gen:  WD/WN, NAD Head: Bernice/AT, No temporalis wasting. Ear/Nose/Throat: Hearing grossly intact, nares w/o erythema or drainage Eyes: Conjunctiva clear. Sclera non-icteric Neck: Supple.  Trachea midline Pulmonary:  Good air movement, no use of accessory muscles.  Cardiac: RRR, no JVD Vascular:  Vessel Right Left  Radial Palpable Palpable                          PT Palpable Palpable  DP Palpable Palpable   Gastrointestinal: soft,  non-tender/non-distended. No guarding/reflex.  Musculoskeletal: M/S 5/5 throughout.  No deformity or atrophy.  No appreciable lower extremity edema. Neurologic: Sensation grossly intact in extremities.  Symmetrical.  Speech is fluent.  Psychiatric: Judgment intact, Mood & affect appropriate for pt's clinical situation. Dermatologic: No rashes or ulcers noted.  No cellulitis or open wounds.       Labs Recent Results (  from the past 2160 hour(s))  Basic metabolic panel     Status: Abnormal   Collection Time: 10/22/20 11:29 AM  Result Value Ref Range   Sodium 138 135 - 145 mmol/L   Potassium 4.1 3.5 - 5.1 mmol/L   Chloride 107 98 - 111 mmol/L   CO2 21 (L) 22 - 32 mmol/L   Glucose, Bld 100 (H) 70 - 99 mg/dL    Comment: Glucose reference range applies only to samples taken after fasting for at least 8 hours.   BUN 24 (H) 6 - 20 mg/dL   Creatinine, Ser 1.611.26 (H) 0.61 - 1.24 mg/dL   Calcium 9.2 8.9 - 09.610.3 mg/dL   GFR, Estimated >04>60 >54>60 mL/min    Comment: (NOTE) Calculated using the CKD-EPI Creatinine Equation (2021)    Anion gap 10 5 - 15    Comment: Performed at [facility], [address] [place], [place] [address]  CBC     Status: Abnormal   Collection Time: 10/22/20 11:29 AM  Result Value Ref Range   WBC 9.0 4.0 - 10.5 K/uL   RBC 6.19 (H) 4.22 - 5.81 MIL/uL   Hemoglobin 16.7 13.0 - 17.0 g/dL   HCT 91.449.1 78.239.0 - 95.652.0 %   MCV 79.3 (L) 80.0 - 100.0 fL   MCH 27.0 26.0 - 34.0 pg   MCHC 34.0 30.0 - 36.0 g/dL   RDW 21.313.9 08.611.5 - 57.815.5 %   Platelets 205 150 - 400 K/uL   nRBC 0.0 0.0 - 0.2 %    Comment: Performed at [facility], [address] [place], [place] [address]  Troponin I (High Sensitivity)     Status: Abnormal   Collection Time: 10/22/20 11:29 AM  Result Value Ref Range   Troponin I (High Sensitivity) 47 (H) <18 ng/L    Comment: (NOTE) Elevated high sensitivity troponin I (hsTnI) values and significant  changes across serial measurements may suggest  ACS but many other  chronic and acute conditions are known to elevate hsTnI results.  Refer to the "Links" section for chest pain algorithms and additional  guidance. Performed at [facility], [address] [place], [place] [address]   Troponin I (High Sensitivity)     Status: Abnormal   Collection Time: 10/22/20  1:43 PM  Result Value Ref Range   Troponin I (High Sensitivity) 59 (H) <18 ng/L    Comment: (NOTE) Elevated high sensitivity troponin I (hsTnI) values and significant  changes across serial measurements may suggest ACS but many other  chronic and acute conditions are known to elevate hsTnI results.  Refer to the "Links" section for chest pain algorithms and additional  guidance. Performed at [facility], [address] [place], [place] [address]   Resp Panel by RT-PCR (Flu A&B, Covid) Nasopharyngeal Swab     Status: None   Collection Time: 10/22/20  2:04 PM   Specimen: Nasopharyngeal Swab; Nasopharyngeal(NP) swabs in vial transport medium  Result Value Ref Range   SARS Coronavirus 2 by RT PCR NEGATIVE NEGATIVE    Comment: (NOTE) SARS-CoV-2 target nucleic acids are NOT DETECTED.  The SARS-CoV-2 RNA is generally detectable in upper respiratory specimens during the acute phase of infection. The lowest concentration of SARS-CoV-2 viral copies this assay can detect is 138 copies/mL. A negative result does not preclude SARS-Cov-2 infection and should not be used as the sole basis for treatment or other patient management decisions. A negative result may occur with  improper specimen collection/handling, submission  of specimen other than nasopharyngeal swab, presence of viral mutation(s) within the areas targeted by this assay, and inadequate number of viral copies(<138 copies/mL). A negative result must be combined with clinical observations, patient history, and epidemiological information. The expected result is Negative.  Fact Sheet for  Patients:  BloggerCourse.com  Fact Sheet for Healthcare Providers:  SeriousBroker.it  This test is no t yet approved or cleared by the [place] FDA and  has been authorized for detection and/or diagnosis of SARS-CoV-2 by FDA under an Emergency Use Authorization (EUA). This EUA will remain  in effect (meaning this test can be used) for the duration of the COVID-19 declaration under Section 564(b)(1) of the Act, 21 U.S.C.section 360bbb-3(b)(1), unless the authorization is terminated  or revoked sooner.       Influenza A by PCR NEGATIVE NEGATIVE   Influenza B by PCR NEGATIVE NEGATIVE    Comment: (NOTE) The Xpert Xpress SARS-CoV-2/FLU/RSV plus assay is intended as an aid in the diagnosis of influenza from Nasopharyngeal swab specimens and should not be used as a sole basis for treatment. Nasal washings and aspirates are unacceptable for Xpert Xpress SARS-CoV-2/FLU/RSV testing.  Fact Sheet for Patients: BloggerCourse.com  Fact Sheet for Healthcare Providers: SeriousBroker.it  This test is not yet approved or cleared by the [place] FDA and has been authorized for detection and/or diagnosis of SARS-CoV-2 by FDA under an Emergency Use Authorization (EUA). This EUA will remain in effect (meaning this test can be used) for the duration of the COVID-19 declaration under Section 564(b)(1) of the Act, 21 U.S.C. section 360bbb-3(b)(1), unless the authorization is terminated or revoked.  Performed at [facility], [address] [place], [place] [address]   APTT     Status: None   Collection Time: 10/22/20  2:18 PM  Result Value Ref Range   aPTT 27 24 - 36 seconds    Comment: Performed at [facility], [address] [place], [place] [address]  Protime-INR     Status: None   Collection Time: 10/22/20  2:18 PM  Result Value Ref Range   Prothrombin Time  13.1 11.4 - 15.2 seconds   INR 1.0 0.8 - 1.2    Comment: (NOTE) INR goal varies based on device and disease states. Performed at [facility], [address] [place], [place] [address]   Heparin level (unfractionated)     Status: None   Collection Time: 10/22/20  9:17 PM  Result Value Ref Range   Heparin Unfractionated 0.46 0.30 - 0.70 IU/mL    Comment: (NOTE) If heparin results are below expected values, and patient dosage has  been confirmed, suggest follow up testing of antithrombin III levels. Performed at [facility], [address] [place], [place] [address]   HIV Antibody (routine testing w rflx)     Status: None   Collection Time: 10/23/20  3:55 AM  Result Value Ref Range   HIV Screen 4th Generation wRfx Non Reactive Non Reactive    Comment: Performed at [facility], [address] [place], [place] [address]  CBC     Status: Abnormal   Collection Time: 10/23/20  3:55 AM  Result Value Ref Range   WBC 9.8 4.0 - 10.5 K/uL   RBC 6.19 (H) 4.22 - 5.81 MIL/uL   Hemoglobin 16.5 13.0 - 17.0 g/dL   HCT 46.5 68.1 - 27.5 %   MCV 80.0 80.0 - 100.0 fL   MCH 26.7 26.0 - 34.0 pg  MCHC 33.3 30.0 - 36.0 g/dL   RDW 79.0 24.0 - 97.3 %   Platelets 221 150 - 400 K/uL   nRBC 0.0 0.0 - 0.2 %    Comment: Performed at [facility], [address] [place], [place] [address]  Basic metabolic panel     Status: Abnormal   Collection Time: 10/23/20  3:55 AM  Result Value Ref Range   Sodium 137 135 - 145 mmol/L   Potassium 4.2 3.5 - 5.1 mmol/L   Chloride 105 98 - 111 mmol/L   CO2 21 (L) 22 - 32 mmol/L   Glucose, Bld 123 (H) 70 - 99 mg/dL    Comment: Glucose reference range applies only to samples taken after fasting for at least 8 hours.   BUN 28 (H) 6 - 20 mg/dL   Creatinine, Ser 2.42 (H) 0.61 - 1.24 mg/dL   Calcium 9.4 8.9 - 68.3 mg/dL   GFR, Estimated 54 (L) >60 mL/min    Comment: (NOTE) Calculated using the CKD-EPI Creatinine Equation (2021)     Anion gap 11 5 - 15    Comment: Performed at [facility], [address] [place], [place] [address]  Heparin level (unfractionated)     Status: None   Collection Time: 10/23/20  3:55 AM  Result Value Ref Range   Heparin Unfractionated 0.42 0.30 - 0.70 IU/mL    Comment: (NOTE) If heparin results are below expected values, and patient dosage has  been confirmed, suggest follow up testing of antithrombin III levels. Performed at [facility], [address] [place], [place] [address]   ECHOCARDIOGRAM COMPLETE     Status: None   Collection Time: 10/23/20  3:05 PM  Result Value Ref Range   Weight 4,468.8 oz   Height 70.5 in   BP 100/74 mmHg   Ao pk vel 0.92 m/s   AV Area VTI 2.88 cm2   AR max vel 2.44 cm2   AV Mean grad 2.0 mmHg   AV Peak grad 3.4 mmHg   S' Lateral 2.20 cm   AV Area mean vel 2.25 cm2   Area-P 1/2 4.74 cm2   MV VTI 2.40 cm2  Heparin level (unfractionated)     Status: None   Collection Time: 10/24/20  4:33 AM  Result Value Ref Range   Heparin Unfractionated 0.34 0.30 - 0.70 IU/mL    Comment: (NOTE) If heparin results are below expected values, and patient dosage has  been confirmed, suggest follow up testing of antithrombin III levels. Performed at [facility], [address] [place], [place] [address]   CBC     Status: Abnormal   Collection Time: 10/24/20  4:33 AM  Result Value Ref Range   WBC 7.3 4.0 - 10.5 K/uL   RBC 5.60 4.22 - 5.81 MIL/uL   Hemoglobin 15.3 13.0 - 17.0 g/dL   HCT 94.1 74.0 - 81.4 %   MCV 79.6 (L) 80.0 - 100.0 fL   MCH 27.3 26.0 - 34.0 pg   MCHC 34.3 30.0 - 36.0 g/dL   RDW 48.1 85.6 - 31.4 %   Platelets 192 150 - 400 K/uL   nRBC 0.0 0.0 - 0.2 %    Comment: Performed at [facility], [address], [place], [place] [address]  Basic metabolic panel     Status: Abnormal   Collection Time: 10/24/20  4:33 AM  Result Value Ref Range   Sodium 138 135 - 145 mmol/L   Potassium 4.0 3.5 -  5.1  mmol/L   Chloride 108 98 - 111 mmol/L   CO2 21 (L) 22 - 32 mmol/L   Glucose, Bld 114 (H) 70 - 99 mg/dL    Comment: Glucose reference range applies only to samples taken after fasting for at least 8 hours.   BUN 27 (H) 6 - 20 mg/dL   Creatinine, Ser 1.61 (H) 0.61 - 1.24 mg/dL   Calcium 8.5 (L) 8.9 - 10.3 mg/dL   GFR, Estimated 60 (L) >60 mL/min    Comment: (NOTE) Calculated using the CKD-EPI Creatinine Equation (2021)    Anion gap 9 5 - 15    Comment: Performed at [facility], [address], [place], [place] [address]  Magnesium     Status: None   Collection Time: 10/24/20  4:33 AM  Result Value Ref Range   Magnesium 2.3 1.7 - 2.4 mg/dL    Comment: Performed at [facility], [address] [place], [place] [address]  Phosphorus     Status: Abnormal   Collection Time: 10/24/20  4:33 AM  Result Value Ref Range   Phosphorus 4.8 (H) 2.5 - 4.6 mg/dL    Comment: Performed at [facility], [address], [place], [place] [address]  Basic metabolic panel     Status: Abnormal   Collection Time: 10/25/20  4:04 AM  Result Value Ref Range   Sodium 135 135 - 145 mmol/L   Potassium 4.0 3.5 - 5.1 mmol/L   Chloride 110 98 - 111 mmol/L   CO2 21 (L) 22 - 32 mmol/L   Glucose, Bld 121 (H) 70 - 99 mg/dL    Comment: Glucose reference range applies only to samples taken after fasting for at least 8 hours.   BUN 21 (H) 6 - 20 mg/dL   Creatinine, Ser 7.82 0.61 - 1.24 mg/dL   Calcium 8.4 (L) 8.9 - 10.3 mg/dL   GFR, Estimated >95 >62 mL/min    Comment: (NOTE) Calculated using the CKD-EPI Creatinine Equation (2021)    Anion gap 4 (L) 5 - 15    Comment: Performed at [facility], [address] [place], [place] [address]    Radiology DG Chest 2 View  Result Date: 10/22/2020 CLINICAL DATA:  Chest tightness.  Shortness of breath. EXAM: CHEST - 2 VIEW COMPARISON:  01/04/2011. FINDINGS: Borderline cardiomegaly and pulmonary venous congestion. Low lung  volumes. Mild infiltrate left upper lung cannot be excluded. No pleural effusion or pneumothorax. No acute bony abnormality. IMPRESSION: 1.  Borderline cardiomegaly and pulmonary venous congestion. 2. Low lung volumes. Mild infiltrate left upper lung cannot be excluded. Electronically Signed   By: Maisie Fus  Register   On: 10/22/2020 12:32   CT Angio Chest PE W and/or Wo Contrast  Result Date: 10/22/2020 CLINICAL DATA:  Shortness of breath and chest tightness for 2 days. EXAM: CT ANGIOGRAPHY CHEST WITH CONTRAST TECHNIQUE: Multidetector CT imaging of the chest was performed using the standard protocol during bolus administration of intravenous contrast. Multiplanar CT image reconstructions and MIPs were obtained to evaluate the vascular anatomy. CONTRAST:  75 mL OMNIPAQUE IOHEXOL 350 MG/ML SOLN COMPARISON:  PA and lateral chest 10/22/2020. FINDINGS: Cardiovascular: A large embolus is seen in the right main pulmonary artery. Emboli are also seen in branches of the left main pulmonary artery. The RV to LV ratio is 1.27 consistent with right heart strain. There is bowing of the interventricular septum into the left ventricle. Heart size is normal. No pericardial effusion. Mediastinum/Nodes: No enlarged mediastinal,  hilar, or axillary lymph nodes. Thyroid gland, trachea, and esophagus demonstrate no significant findings. Lungs/Pleura: No pleural effusion. There is ground-glass attenuating airspace disease in the left upper lobe. The lungs are otherwise clear. Upper Abdomen: Marked fatty infiltration of the liver. Otherwise negative. Musculoskeletal: No acute or focal abnormality. Review of the MIP images confirms the above findings. IMPRESSION: The examination is positive for bilateral pulmonary emboli with a large clot burden and findings consistent with right heart strain. Hazy airspace opacity in left upper lobe is most worrisome for pneumonia rather than pulmonary infarct. Critical Value/emergent results were called  by telephone at the time of interpretation on 10/22/2020 at 2:08 pm to provider [facility] , who verbally acknowledged these results. Electronically Signed   By: Drusilla Kanner M.D.   On: 10/22/2020 14:09   PERIPHERAL VASCULAR CATHETERIZATION  Result Date: 10/23/2020 See op note  US Venous Img Lower Bilateral  Result Date: 10/22/2020 CLINICAL DATA:  Left calf pain and cramping. EXAM: Right LOWER EXTREMITY VENOUS DOPPLER ULTRASOUND TECHNIQUE: Gray-scale sonography with graded compression, as well as color Doppler and duplex ultrasound were performed to evaluate the lower extremity deep venous systems from the level of the common femoral vein and including the common femoral, femoral, profunda femoral, popliteal and calf veins including the posterior tibial, peroneal and gastrocnemius veins when visible. The superficial great saphenous vein was also interrogated. Spectral Doppler was utilized to evaluate flow at rest and with distal augmentation maneuvers in the common femoral, femoral and popliteal veins. COMPARISON:  None. FINDINGS: Contralateral Common Femoral Vein: Respiratory phasicity is normal and symmetric with the symptomatic side. No evidence of thrombus. Normal compressibility. Common Femoral Vein: No evidence of thrombus. Normal compressibility, respiratory phasicity and response to augmentation. Saphenofemoral Junction: No evidence of thrombus. Normal compressibility and flow on color Doppler imaging. Profunda Femoral Vein: No evidence of thrombus. Normal compressibility and flow on color Doppler imaging. Femoral Vein: No evidence of thrombus. Normal compressibility, respiratory phasicity and response to augmentation. Popliteal Vein: There is nonocclusive partially compressible thrombus in the popliteal vein. Calf Veins: No evidence of thrombus. Normal compressibility and flow on color Doppler imaging. Superficial Great Saphenous Vein: No evidence of thrombus. Normal compressibility.  Venous Reflux:  None. Other Findings:  None. IMPRESSION: Nonocclusive partially compressive thrombus in the right popliteal vein. These results will be called to the ordering clinician or representative by the Radiologist Assistant, and communication documented in the PACS or [employer]. Electronically Signed   By: Rudie Meyer M.D.   On: 10/22/2020 15:04   ECHOCARDIOGRAM COMPLETE  Result Date: 10/23/2020    ECHOCARDIOGRAM REPORT   Patient Name:   NASIM HABEEB Date of Exam: 10/23/2020 Medical Rec #:  [id number]      Height:       70.5 in Accession #:    [id number]     Weight:       279.3 lb Date of Birth:  [birth year]     BSA:          2.418 m Patient Age:    49 years       BP:           100/74 mmHg Patient Gender: M              HR:           89 bpm. Exam Location:  [facility] Procedure: 2D Echo, Color Doppler, Cardiac Doppler, Intracardiac Opacification            Agent and Strain  Analysis Indications:     R06.00 Dyspnea; I26.09 Pulmonary Embolus  History:         Patient has no prior history of Echocardiogram examinations.                  CKD; Risk Factors:Sleep Apnea and HCL.  Sonographer:     Humphrey Rolls RDCS (AE) Referring Phys:  [id number] AMY N [facility] Diagnosing Phys: Cristal Deer End MD  Sonographer Comments: Technically difficult study due to poor echo windows. Image acquisition challenging due to patient body habitus. IMPRESSIONS  1. Left ventricular ejection fraction, by estimation, is 65 to 70%. The left ventricle has normal function. Left ventricular endocardial border not optimally defined to evaluate regional wall motion. There is mild left ventricular hypertrophy. Left ventricular diastolic parameters are consistent with Grade I diastolic dysfunction (impaired relaxation). There is the interventricular septum is flattened in systole, consistent with right ventricular pressure overload. The average left ventricular global longitudinal strain is -13.1 %. The global longitudinal strain is abnormal.  2.  Right ventricular systolic function is moderately reduced. The right ventricular size is mildly enlarged. Mildly increased right ventricular wall thickness.  3. The mitral valve is normal in structure. No evidence of mitral valve regurgitation. No evidence of mitral stenosis.  4. The aortic valve has an indeterminant number of cusps. Aortic valve regurgitation is not visualized. No aortic stenosis is present. FINDINGS  Left Ventricle: Left ventricular ejection fraction, by estimation, is 65 to 70%. The left ventricle has normal function. Left ventricular endocardial border not optimally defined to evaluate regional wall motion. Definity contrast agent was given IV to delineate the left ventricular endocardial borders. The average left ventricular global longitudinal strain is -13.1 %. The global longitudinal strain is abnormal. The left ventricular internal cavity size was normal in size. There is mild left ventricular hypertrophy. The interventricular septum is flattened in systole, consistent with right ventricular pressure overload. Left ventricular diastolic parameters are consistent with Grade I diastolic dysfunction (impaired relaxation). Right Ventricle: The right ventricular size is mildly enlarged. Mildly increased right ventricular wall thickness. Right ventricular systolic function is moderately reduced. Left Atrium: Left atrial size was normal in size. Right Atrium: Right atrial size was normal in size. Pericardium: The pericardium was not well visualized. Mitral Valve: The mitral valve is normal in structure. No evidence of mitral valve regurgitation. No evidence of mitral valve stenosis. MV peak gradient, 2.0 mmHg. The mean mitral valve gradient is 1.0 mmHg. Tricuspid Valve: The tricuspid valve is normal in structure. Tricuspid valve regurgitation is trivial. Aortic Valve: The aortic valve has an indeterminant number of cusps. Aortic valve regurgitation is not visualized. No aortic stenosis is present.  Aortic valve mean gradient measures 2.0 mmHg. Aortic valve peak gradient measures 3.4 mmHg. Aortic valve area, by VTI measures 2.88 cm. Pulmonic Valve: The pulmonic valve was not well visualized. Pulmonic valve regurgitation is not visualized. No evidence of pulmonic stenosis. Aorta: The aortic root is normal in size and structure. Pulmonary Artery: The pulmonary artery is not well seen. IAS/Shunts: The interatrial septum was not well visualized.  LEFT VENTRICLE PLAX 2D LVIDd:         3.60 cm  Diastology LVIDs:         2.20 cm  LV e' medial:    5.98 cm/s LV PW:         1.00 cm  LV E/e' medial:  7.4 LV IVS:        1.20 cm  LV e' lateral:  11.50 cm/s LVOT diam:     2.10 cm  LV E/e' lateral: 3.8 LV SV:         33 LV SV Index:   14       2D Longitudinal Strain LVOT Area:     3.46 cm 2D Strain GLS Avg:     -13.1 %  RIGHT VENTRICLE RV Basal diam:  3.30 cm LEFT ATRIUM           Index      RIGHT ATRIUM           Index LA diam:      2.80 cm 1.16 cm/m RA Area:     16.00 cm LA Vol (A4C): 18.1 ml 7.48 ml/m RA Volume:   40.10 ml  16.58 ml/m  AORTIC VALVE                   PULMONIC VALVE AV Area (Vmax):    2.44 cm    PV Vmax:       0.65 m/s AV Area (Vmean):   2.25 cm    PV Vmean:      37.500 cm/s AV Area (VTI):     2.88 cm    PV VTI:        0.085 m AV Vmax:           91.80 cm/s  PV Peak grad:  1.7 mmHg AV Vmean:          62.900 cm/s PV Mean grad:  1.0 mmHg AV VTI:            0.115 m AV Peak Grad:      3.4 mmHg AV Mean Grad:      2.0 mmHg LVOT Vmax:         64.60 cm/s LVOT Vmean:        40.900 cm/s LVOT VTI:          0.096 m LVOT/AV VTI ratio: 0.83  AORTA Ao Root diam: 3.50 cm MITRAL VALVE MV Area (PHT): 4.74 cm    SHUNTS MV Area VTI:   2.40 cm    Systemic VTI:  0.10 m MV Peak grad:  2.0 mmHg    Systemic Diam: 2.10 cm MV Mean grad:  1.0 mmHg MV Vmax:       0.71 m/s MV Vmean:      40.5 cm/s MV Decel Time: 160 msec MV E velocity: 44.10 cm/s MV A velocity: 70.30 cm/s MV E/A ratio:  0.63 Cristal Deer End MD Electronically  signed by Yvonne Kendall MD Signature Date/Time: 10/23/2020/4:30:32 PM    Final     Assessment/Plan  Pulmonary embolism (HCC) Status post pulmonary thrombectomy now with doing reasonably well.  No significant residual chest pain and only mild shortness of breath with exertion.  This is a marked improvement from his state prior to thrombectomy.  Continue anticoagulation for a minimum of 6 months.  In 6 months, I would consider changing him to half dose Eliquis for long-term therapy.  We can see him back at that time to discuss.  He is also seeing hematology and their input will be appreciated on this as well.  Polycythemia Was already seeing hematology for this.  Hypertension blood pressure control important in reducing the progression of atherosclerotic disease. On appropriate oral medications.     Festus Barren, MD  11/03/2020 10:53 AM    This note was created with Dragon medical transcription system.  Any errors from dictation are purely unintentional

## 2020-11-06 ENCOUNTER — Inpatient Hospital Stay: Payer: 59 | Attending: Oncology | Admitting: Oncology

## 2020-11-06 ENCOUNTER — Other Ambulatory Visit: Payer: Self-pay

## 2020-11-06 VITALS — BP 118/84 | HR 76 | Temp 98.1°F | Resp 16 | Wt 290.4 lb

## 2020-11-06 DIAGNOSIS — E7800 Pure hypercholesterolemia, unspecified: Secondary | ICD-10-CM | POA: Diagnosis not present

## 2020-11-06 DIAGNOSIS — K219 Gastro-esophageal reflux disease without esophagitis: Secondary | ICD-10-CM | POA: Insufficient documentation

## 2020-11-06 DIAGNOSIS — Z7901 Long term (current) use of anticoagulants: Secondary | ICD-10-CM | POA: Insufficient documentation

## 2020-11-06 DIAGNOSIS — D751 Secondary polycythemia: Secondary | ICD-10-CM | POA: Insufficient documentation

## 2020-11-06 DIAGNOSIS — I82431 Acute embolism and thrombosis of right popliteal vein: Secondary | ICD-10-CM | POA: Insufficient documentation

## 2020-11-06 DIAGNOSIS — Z8481 Family history of carrier of genetic disease: Secondary | ICD-10-CM

## 2020-11-06 DIAGNOSIS — Z79899 Other long term (current) drug therapy: Secondary | ICD-10-CM | POA: Insufficient documentation

## 2020-11-06 DIAGNOSIS — N189 Chronic kidney disease, unspecified: Secondary | ICD-10-CM | POA: Insufficient documentation

## 2020-11-06 DIAGNOSIS — I2699 Other pulmonary embolism without acute cor pulmonale: Secondary | ICD-10-CM

## 2020-11-06 NOTE — Progress Notes (Signed)
Hematology/Oncology Consult note [facility]  Telephone:([phone] [phone]  Patient Care Team: Cletis Athens, MD as PCP - General (Internal Medicine) Cletis Athens, MD (Internal Medicine) Christene Lye, MD (General Surgery) Sindy Guadeloupe, MD as Consulting Physician (Hematology and Oncology)   Name of the patient: Christopher Burns  [id number]  1970-12-07   Date of visit: 11/06/20  Diagnosis-history of testosterone induced secondary polycythemia now with right lower extremity DVT and bilateral PE with right heart strain  Chief complaint/ Reason for visit-post hospital discharge follow-up  Heme/Onc history: Patient is a 50 year old male with past medical history significant for testosterone induced secondary polycythemia and was last seen by me in May 2021.  He has required a few phlebotomy sessions but following that he stopped taking testosterone and his hemoglobin normalized.  JAK2 mutation testing was negative and EPO levels were normal.  More recently patient Had symptoms of exertional shortness of breath and chest palpitations for which she was admitted to the hospital and was found to have right popliteal vein DVT as well as bilateral PE with right heart strain requiring thrombectomy.  He was discharged on Eliquis.  Interval history-patient works for the [employer] and does not want to stay on anticoagulation long-term because they would be certain jobs that he would not be able to do on blood thinner.  That would also affect his economic status.  He is now off oxygen and denies any exertional shortness of breath  ECOG PS- 0 Pain scale- 0   Review of systems- Review of Systems  Constitutional: Negative for chills, fever, malaise/fatigue and weight loss.  HENT: Negative for congestion, ear discharge and nosebleeds.   Eyes: Negative for blurred vision.  Respiratory: Negative for cough, hemoptysis, sputum production,  shortness of breath and wheezing.   Cardiovascular: Negative for chest pain, palpitations, orthopnea and claudication.  Gastrointestinal: Negative for abdominal pain, blood in stool, constipation, diarrhea, heartburn, melena, nausea and vomiting.  Genitourinary: Negative for dysuria, flank pain, frequency, hematuria and urgency.  Musculoskeletal: Negative for back pain, joint pain and myalgias.  Skin: Negative for rash.  Neurological: Negative for dizziness, tingling, focal weakness, seizures, weakness and headaches.  Endo/Heme/Allergies: Does not bruise/bleed easily.  Psychiatric/Behavioral: Negative for depression and suicidal ideas. The patient does not have insomnia.      Allergies  Allergen Reactions  . Other Itching and Rash    Pt was tested and positive for feline, canine and cockroaches. Pt has rash, itching, watery eyes. And is on allergy injections since 2017     Past Medical History:  Diagnosis Date  . Asthma   . Chronic kidney disease   . Complication of anesthesia    HICCUPS FOR 1 WEEK AFTER BICEP SURGERY  . Dizziness   . GERD (gastroesophageal reflux disease)   . HA (headache)   . Hemorrhoid   . High cholesterol   . Hypercholesteremia   . Polycythemia   . Sleep apnea    NO CPAP     Past Surgical History:  Procedure Laterality Date  . bisep Left 2007  . COLONOSCOPY WITH PROPOFOL N/A 03/01/2019   Procedure: COLONOSCOPY WITH PROPOFOL;  Surgeon: Jonathon Bellows, MD;  Location: [facility] ENDOSCOPY;  Service: Gastroenterology;  Laterality: N/A;  . KNEE SURGERY Right   . LASIK    . PULMONARY THROMBECTOMY Bilateral 10/23/2020   Procedure: PULMONARY THROMBECTOMY;  Surgeon: Algernon Huxley, MD;  Location: [facility];  Service: Cardiovascular;  Laterality: Bilateral;  .  TONSILLECTOMY    . UMBILICAL HERNIA REPAIR N/A 07/29/2015   Procedure: HERNIA REPAIR UMBILICAL ADULT;  Surgeon: Christene Lye, MD;  Location: [facility] ORS;  Service: General;  Laterality: N/A;  .  VASOTOMY      Social History   Socioeconomic History  . Marital status: Married    Spouse name: [name]  . Number of children: 1  . Years of education: 47  . Highest education level: Not on file  Occupational History    Comment: [employer]  Tobacco Use  . Smoking status: Never Smoker  . Smokeless tobacco: Never Used  Vaping Use  . Vaping Use: Never used  Substance and Sexual Activity  . Alcohol use: Not Currently  . Drug use: No  . Sexual activity: Yes  Other Topics Concern  . Not on file  Social History Narrative   Patient lives at home  with his wife [name]).  Patient works full time for [employer].   Education some college.   Right handed.   Caffeine one shot of colombian  coffee.   Social Determinants of Health   Financial Resource Strain: Not on file  Food Insecurity: Not on file  Transportation Needs: Not on file  Physical Activity: Not on file  Stress: Not on file  Social Connections: Not on file  Intimate Partner Violence: Not on file    Family History  Problem Relation Age of Onset  . Cancer Mother        breast  . Brain cancer Mother   . High Cholesterol Mother   . Diabetes Father   . High blood pressure Father   . High Cholesterol Father   . Prostate cancer Paternal Grandfather   . Cancer Maternal Grandmother   . Cancer Maternal Grandfather      Current Outpatient Medications:  .  apixaban (ELIQUIS) 5 MG TABS tablet, Take 1 tablet (5 mg total) by mouth 2 (two) times daily., Disp: 60 tablet, Rfl: 0 .  Apple Cider Vinegar 188 MG CAPS, Take 188 mg by mouth daily., Disp: , Rfl:  .  cetirizine (ZYRTEC) 10 MG tablet, Take 10 mg by mouth daily., Disp: , Rfl:  .  Cholecalciferol (VITAMIN D) 2000 UNITS tablet, Take 2,000 Units by mouth daily., Disp: , Rfl:  .  esomeprazole (NEXIUM) 40 MG capsule, Take 40 mg by mouth daily. , Disp: , Rfl:  .  montelukast (SINGULAIR) 10 MG tablet, Take 10 mg by mouth at bedtime. , Disp: , Rfl: 11 .   Multiple Vitamins-Minerals (MULTIVITAMIN WITH MINERALS) tablet, Take 1 tablet by mouth daily., Disp: , Rfl:  .  omega-3 acid ethyl esters (LOVAZA) 1 G capsule, Take 1 g by mouth daily., Disp: , Rfl:  .  simvastatin (ZOCOR) 10 MG tablet, TAKE 1 TABLET BY MOUTH EVERYDAY AT BEDTIME, Disp: 30 tablet, Rfl: 3 .  VENTOLIN HFA 108 (90 Base) MCG/ACT inhaler, USE 2 PUFFS 4 TIMES A DAY AS NEEDED, Disp: 3 each, Rfl: 3 .  vitamin B-12 (CYANOCOBALAMIN) 100 MCG tablet, Take 100 mcg by mouth daily., Disp: , Rfl:  .  apixaban (ELIQUIS) 5 MG TABS tablet, Take 2 tablets (10 mg total) by mouth 2 (two) times daily for 6 days., Disp: 24 tablet, Rfl: 0  Physical exam:  Vitals:   11/06/20 1107  BP: 118/84  Pulse: 76  Resp: 16  Temp: 98.1 F (36.7 C)  TempSrc: Tympanic  SpO2: 96%  Weight: 290 lb 6.4 oz (131.7 kg)   Physical  Exam Constitutional:      General: He is not in acute distress. Cardiovascular:     Rate and Rhythm: Normal rate.  Pulmonary:     Effort: Pulmonary effort is normal.  Musculoskeletal:     Right lower leg: No edema.     Left lower leg: No edema.  Skin:    General: Skin is warm and dry.  Neurological:     Mental Status: He is alert and oriented to person, place, and time.      CMP Latest Ref Rng & Units 10/25/2020  Glucose 70 - 99 mg/dL 121(H)  BUN 6 - 20 mg/dL 21(H)  Creatinine 0.61 - 1.24 mg/dL 1.13  Sodium 135 - 145 mmol/L 135  Potassium 3.5 - 5.1 mmol/L 4.0  Chloride 98 - 111 mmol/L 110  CO2 22 - 32 mmol/L 21(L)  Calcium 8.9 - 10.3 mg/dL 8.4(L)  Total Protein 6.5 - 8.1 g/dL -  Total Bilirubin 0.3 - 1.2 mg/dL -  Alkaline Phos 38 - 126 U/L -  AST 15 - 41 U/L -  ALT 0 - 44 U/L -   CBC Latest Ref Rng & Units 10/24/2020  WBC 4.0 - 10.5 K/uL 7.3  Hemoglobin 13.0 - 17.0 g/dL 15.3  Hematocrit 39.0 - 52.0 % 44.6  Platelets 150 - 400 K/uL 192    No images are attached to the encounter.  DG Chest 2 View  Result Date: 10/22/2020 CLINICAL DATA:  Chest tightness.  Shortness  of breath. EXAM: CHEST - 2 VIEW COMPARISON:  01/04/2011. FINDINGS: Borderline cardiomegaly and pulmonary venous congestion. Low lung volumes. Mild infiltrate left upper lung cannot be excluded. No pleural effusion or pneumothorax. No acute bony abnormality. IMPRESSION: 1.  Borderline cardiomegaly and pulmonary venous congestion. 2. Low lung volumes. Mild infiltrate left upper lung cannot be excluded. Electronically Signed   By: Marcello Moores  Register   On: 10/22/2020 12:32   CT Angio Chest PE W and/or Wo Contrast  Result Date: 10/22/2020 CLINICAL DATA:  Shortness of breath and chest tightness for 2 days. EXAM: CT ANGIOGRAPHY CHEST WITH CONTRAST TECHNIQUE: Multidetector CT imaging of the chest was performed using the standard protocol during bolus administration of intravenous contrast. Multiplanar CT image reconstructions and MIPs were obtained to evaluate the vascular anatomy. CONTRAST:  75 mL OMNIPAQUE IOHEXOL 350 MG/ML SOLN COMPARISON:  PA and lateral chest 10/22/2020. FINDINGS: Cardiovascular: A large embolus is seen in the right main pulmonary artery. Emboli are also seen in branches of the left main pulmonary artery. The RV to LV ratio is 1.27 consistent with right heart strain. There is bowing of the interventricular septum into the left ventricle. Heart size is normal. No pericardial effusion. Mediastinum/Nodes: No enlarged mediastinal, hilar, or axillary lymph nodes. Thyroid gland, trachea, and esophagus demonstrate no significant findings. Lungs/Pleura: No pleural effusion. There is ground-glass attenuating airspace disease in the left upper lobe. The lungs are otherwise clear. Upper Abdomen: Marked fatty infiltration of the liver. Otherwise negative. Musculoskeletal: No acute or focal abnormality. Review of the MIP images confirms the above findings. IMPRESSION: The examination is positive for bilateral pulmonary emboli with a large clot burden and findings consistent with right heart strain. Hazy airspace  opacity in left upper lobe is most worrisome for pneumonia rather than pulmonary infarct. Critical Value/emergent results were called by telephone at the time of interpretation on 10/22/2020 at 2:08 pm to provider [facility] , who verbally acknowledged these results. Electronically Signed   By: Inge Rise M.D.   On:  10/22/2020 14:09   PERIPHERAL VASCULAR CATHETERIZATION  Result Date: 10/23/2020 See op note  US Venous Img Lower Bilateral  Result Date: 10/22/2020 CLINICAL DATA:  Left calf pain and cramping. EXAM: Right LOWER EXTREMITY VENOUS DOPPLER ULTRASOUND TECHNIQUE: Gray-scale sonography with graded compression, as well as color Doppler and duplex ultrasound were performed to evaluate the lower extremity deep venous systems from the level of the common femoral vein and including the common femoral, femoral, profunda femoral, popliteal and calf veins including the posterior tibial, peroneal and gastrocnemius veins when visible. The superficial great saphenous vein was also interrogated. Spectral Doppler was utilized to evaluate flow at rest and with distal augmentation maneuvers in the common femoral, femoral and popliteal veins. COMPARISON:  None. FINDINGS: Contralateral Common Femoral Vein: Respiratory phasicity is normal and symmetric with the symptomatic side. No evidence of thrombus. Normal compressibility. Common Femoral Vein: No evidence of thrombus. Normal compressibility, respiratory phasicity and response to augmentation. Saphenofemoral Junction: No evidence of thrombus. Normal compressibility and flow on color Doppler imaging. Profunda Femoral Vein: No evidence of thrombus. Normal compressibility and flow on color Doppler imaging. Femoral Vein: No evidence of thrombus. Normal compressibility, respiratory phasicity and response to augmentation. Popliteal Vein: There is nonocclusive partially compressible thrombus in the popliteal vein. Calf Veins: No evidence of thrombus. Normal  compressibility and flow on color Doppler imaging. Superficial Great Saphenous Vein: No evidence of thrombus. Normal compressibility. Venous Reflux:  None. Other Findings:  None. IMPRESSION: Nonocclusive partially compressive thrombus in the right popliteal vein. These results will be called to the ordering clinician or representative by the Radiologist Assistant, and communication documented in the PACS or [employer]. Electronically Signed   By: Marijo Sanes M.D.   On: 10/22/2020 15:04   ECHOCARDIOGRAM COMPLETE  Result Date: 10/23/2020    ECHOCARDIOGRAM REPORT   Patient Name:   JOSHUAL TERRIO Date of Exam: 10/23/2020 Medical Rec #:  [id number]      Height:       70.5 in Accession #:    [id number]     Weight:       279.3 lb Date of Birth:  03-22-1971     BSA:          2.418 m Patient Age:    89 years       BP:           100/74 mmHg Patient Gender: M              HR:           89 bpm. Exam Location:  [facility] Procedure: 2D Echo, Color Doppler, Cardiac Doppler, Intracardiac Opacification            Agent and Strain Analysis Indications:     R06.00 Dyspnea; I26.09 Pulmonary Embolus  History:         Patient has no prior history of Echocardiogram examinations.                  CKD; Risk Factors:Sleep Apnea and HCL.  Sonographer:     Charmayne Sheer RDCS (AE) Referring Phys:  [id number] AMY N COX Diagnosing Phys: Harrell Gave End MD  Sonographer Comments: Technically difficult study due to poor echo windows. Image acquisition challenging due to patient body habitus. IMPRESSIONS  1. Left ventricular ejection fraction, by estimation, is 65 to 70%. The left ventricle has normal function. Left ventricular endocardial border not optimally defined to evaluate regional wall motion. There is mild left ventricular hypertrophy. Left ventricular diastolic parameters are consistent  with Grade I diastolic dysfunction (impaired relaxation). There is the interventricular septum is flattened in systole, consistent with right ventricular  pressure overload. The average left ventricular global longitudinal strain is -13.1 %. The global longitudinal strain is abnormal.  2. Right ventricular systolic function is moderately reduced. The right ventricular size is mildly enlarged. Mildly increased right ventricular wall thickness.  3. The mitral valve is normal in structure. No evidence of mitral valve regurgitation. No evidence of mitral stenosis.  4. The aortic valve has an indeterminant number of cusps. Aortic valve regurgitation is not visualized. No aortic stenosis is present. FINDINGS  Left Ventricle: Left ventricular ejection fraction, by estimation, is 65 to 70%. The left ventricle has normal function. Left ventricular endocardial border not optimally defined to evaluate regional wall motion. Definity contrast agent was given IV to delineate the left ventricular endocardial borders. The average left ventricular global longitudinal strain is -13.1 %. The global longitudinal strain is abnormal. The left ventricular internal cavity size was normal in size. There is mild left ventricular hypertrophy. The interventricular septum is flattened in systole, consistent with right ventricular pressure overload. Left ventricular diastolic parameters are consistent with Grade I diastolic dysfunction (impaired relaxation). Right Ventricle: The right ventricular size is mildly enlarged. Mildly increased right ventricular wall thickness. Right ventricular systolic function is moderately reduced. Left Atrium: Left atrial size was normal in size. Right Atrium: Right atrial size was normal in size. Pericardium: The pericardium was not well visualized. Mitral Valve: The mitral valve is normal in structure. No evidence of mitral valve regurgitation. No evidence of mitral valve stenosis. MV peak gradient, 2.0 mmHg. The mean mitral valve gradient is 1.0 mmHg. Tricuspid Valve: The tricuspid valve is normal in structure. Tricuspid valve regurgitation is trivial. Aortic  Valve: The aortic valve has an indeterminant number of cusps. Aortic valve regurgitation is not visualized. No aortic stenosis is present. Aortic valve mean gradient measures 2.0 mmHg. Aortic valve peak gradient measures 3.4 mmHg. Aortic valve area, by VTI measures 2.88 cm. Pulmonic Valve: The pulmonic valve was not well visualized. Pulmonic valve regurgitation is not visualized. No evidence of pulmonic stenosis. Aorta: The aortic root is normal in size and structure. Pulmonary Artery: The pulmonary artery is not well seen. IAS/Shunts: The interatrial septum was not well visualized.  LEFT VENTRICLE PLAX 2D LVIDd:         3.60 cm  Diastology LVIDs:         2.20 cm  LV e' medial:    5.98 cm/s LV PW:         1.00 cm  LV E/e' medial:  7.4 LV IVS:        1.20 cm  LV e' lateral:   11.50 cm/s LVOT diam:     2.10 cm  LV E/e' lateral: 3.8 LV SV:         33 LV SV Index:   14       2D Longitudinal Strain LVOT Area:     3.46 cm 2D Strain GLS Avg:     -13.1 %  RIGHT VENTRICLE RV Basal diam:  3.30 cm LEFT ATRIUM           Index      RIGHT ATRIUM           Index LA diam:      2.80 cm 1.16 cm/m RA Area:     16.00 cm LA Vol (A4C): 18.1 ml 7.48 ml/m RA Volume:   40.10 ml  16.58 ml/m  AORTIC VALVE                   PULMONIC VALVE AV Area (Vmax):    2.44 cm    PV Vmax:       0.65 m/s AV Area (Vmean):   2.25 cm    PV Vmean:      37.500 cm/s AV Area (VTI):     2.88 cm    PV VTI:        0.085 m AV Vmax:           91.80 cm/s  PV Peak grad:  1.7 mmHg AV Vmean:          62.900 cm/s PV Mean grad:  1.0 mmHg AV VTI:            0.115 m AV Peak Grad:      3.4 mmHg AV Mean Grad:      2.0 mmHg LVOT Vmax:         64.60 cm/s LVOT Vmean:        40.900 cm/s LVOT VTI:          0.096 m LVOT/AV VTI ratio: 0.83  AORTA Ao Root diam: 3.50 cm MITRAL VALVE MV Area (PHT): 4.74 cm    SHUNTS MV Area VTI:   2.40 cm    Systemic VTI:  0.10 m MV Peak grad:  2.0 mmHg    Systemic Diam: 2.10 cm MV Mean grad:  1.0 mmHg MV Vmax:       0.71 m/s MV Vmean:       40.5 cm/s MV Decel Time: 160 msec MV E velocity: 44.10 cm/s MV A velocity: 70.30 cm/s MV E/A ratio:  0.63 Christopher End MD Electronically signed by Nelva Bush MD Signature Date/Time: 10/23/2020/4:30:32 PM    Final      Assessment and plan- Patient is a 50 y.o. male with prior history of secondary polycythemia from testosterone replacement therapy now presenting with right popliteal vein thrombus and bilateral pulmonary embolism with right heart strain  Patient had a minor injury to his right knee a week to 10 days prior to his episode of bilateral PE and right popliteal vein thrombosis.  It is unclear if that event in any way triggered the DVT but it would be hard to attribute it as a provoked event.  Ideally for an unprovoked DVT and bilateral PE he needs to be on lifelong anticoagulation.  However patient would like to come off anticoagulation as soon as possible given his job constraints.  He will have more flexibility in his job only when he comes off all blood thinners.  He is agreeable to staying on Eliquis for 6 months and following that options include switching him to a lower dose of Eliquis 2.5 mg twice daily.  Aspirin low-dose unlikely to help significantly with a recurrent DVT or PE with antiplatelet properties but is a consideration.  I would like to wait for another 6 to 8 weeks before doing his hypercoagulable work-up and I will order a factor V Leiden, prothrombin gene mutation, Antithrombin III, protein C protein S as well as antiphospholipid antibody syndrome panel.  I will see him for a video visit after the results of the blood work are back.  He will also need to repeat his echocardiogram 3 to 4 months from now  Patient states that his mother has a history of BRCA mutation and I will refer him to genetic counseling.  He can get the blood work done  when he comes for his labs in 2 months as well.  I have advised him to stay off all testosterone replacement therapy as well as  natural supplements that boost testosterone   Total face to face encounter time for this patient visit was 40 min.     Visit Diagnosis 1. Bilateral pulmonary embolism (HCC)      Dr. Randa Evens, MD, MPH [facility] [phone] 11/06/2020 3:21 PM

## 2020-11-18 ENCOUNTER — Telehealth (INDEPENDENT_AMBULATORY_CARE_PROVIDER_SITE_OTHER): Payer: Self-pay

## 2020-11-18 ENCOUNTER — Other Ambulatory Visit: Payer: Self-pay

## 2020-11-18 ENCOUNTER — Ambulatory Visit (INDEPENDENT_AMBULATORY_CARE_PROVIDER_SITE_OTHER): Payer: 59 | Admitting: Bariatrics

## 2020-11-18 ENCOUNTER — Encounter (INDEPENDENT_AMBULATORY_CARE_PROVIDER_SITE_OTHER): Payer: Self-pay | Admitting: Bariatrics

## 2020-11-18 ENCOUNTER — Ambulatory Visit (INDEPENDENT_AMBULATORY_CARE_PROVIDER_SITE_OTHER): Payer: 59 | Admitting: Family Medicine

## 2020-11-18 VITALS — BP 128/83 | HR 61 | Temp 97.7°F | Ht 69.0 in | Wt 287.0 lb

## 2020-11-18 DIAGNOSIS — R5383 Other fatigue: Secondary | ICD-10-CM | POA: Diagnosis not present

## 2020-11-18 DIAGNOSIS — E559 Vitamin D deficiency, unspecified: Secondary | ICD-10-CM | POA: Diagnosis not present

## 2020-11-18 DIAGNOSIS — R7309 Other abnormal glucose: Secondary | ICD-10-CM

## 2020-11-18 DIAGNOSIS — Z1331 Encounter for screening for depression: Secondary | ICD-10-CM

## 2020-11-18 DIAGNOSIS — Z6841 Body Mass Index (BMI) 40.0 and over, adult: Secondary | ICD-10-CM

## 2020-11-18 DIAGNOSIS — I10 Essential (primary) hypertension: Secondary | ICD-10-CM | POA: Diagnosis not present

## 2020-11-18 DIAGNOSIS — E782 Mixed hyperlipidemia: Secondary | ICD-10-CM

## 2020-11-18 DIAGNOSIS — Z0289 Encounter for other administrative examinations: Secondary | ICD-10-CM

## 2020-11-18 DIAGNOSIS — Z9189 Other specified personal risk factors, not elsewhere classified: Secondary | ICD-10-CM | POA: Diagnosis not present

## 2020-11-18 DIAGNOSIS — K219 Gastro-esophageal reflux disease without esophagitis: Secondary | ICD-10-CM | POA: Diagnosis not present

## 2020-11-18 DIAGNOSIS — R0602 Shortness of breath: Secondary | ICD-10-CM

## 2020-11-18 NOTE — Telephone Encounter (Signed)
Patient left a voicemail requesting refills for Eliquis 5 mg to be call into pharmacy. I informed the patient that I left a message on [facility] voicemail for Eliquis 5 mg bid #60 with 3 refills.

## 2020-11-19 ENCOUNTER — Encounter (INDEPENDENT_AMBULATORY_CARE_PROVIDER_SITE_OTHER): Payer: Self-pay | Admitting: Bariatrics

## 2020-11-19 DIAGNOSIS — R7303 Prediabetes: Secondary | ICD-10-CM | POA: Insufficient documentation

## 2020-11-19 LAB — HEMOGLOBIN A1C
Est. average glucose Bld gHb Est-mCnc: 128 mg/dL
Hgb A1c MFr Bld: 6.1 % — ABNORMAL HIGH (ref 4.8–5.6)

## 2020-11-19 LAB — VITAMIN D 25 HYDROXY (VIT D DEFICIENCY, FRACTURES): Vit D, 25-Hydroxy: 36.8 ng/mL (ref 30.0–100.0)

## 2020-11-19 LAB — LIPID PANEL WITH LDL/HDL RATIO
Cholesterol, Total: 216 mg/dL — ABNORMAL HIGH (ref 100–199)
HDL: 47 mg/dL (ref >39)
LDL Chol Calc (NIH): 135 mg/dL — ABNORMAL HIGH (ref 0–99)
LDL/HDL Ratio: 2.9 ratio (ref 0.0–3.6)
Triglycerides: 190 mg/dL — ABNORMAL HIGH (ref 0–149)
VLDL Cholesterol Cal: 34 mg/dL (ref 5–40)

## 2020-11-19 LAB — TSH+T4F+T3FREE
Free T4: 1.19 ng/dL (ref 0.82–1.77)
T3, Free: 3.6 pg/mL (ref 2.0–4.4)
TSH: 1.27 u[IU]/mL (ref 0.450–4.500)

## 2020-11-19 LAB — INSULIN, RANDOM: INSULIN: 33.4 u[IU]/mL — ABNORMAL HIGH (ref 2.6–24.9)

## 2020-11-23 NOTE — Progress Notes (Signed)
Dear Dr. Wyn Quaker,   Thank you for referring Christopher Burns to our clinic. The following note includes my evaluation and treatment recommendations.  Chief Complaint:   OBESITY Christopher Burns (MR# [id number]) is a 50 y.o. male who presents for evaluation and treatment of obesity and related comorbidities. Current BMI is Body mass index is 42.38 kg/m. Christopher Burns has been struggling with his weight for many years and has been unsuccessful in either losing weight, maintaining weight loss, or reaching his healthy weight goal.  Christopher Burns is currently in the action stage of change and ready to dedicate time achieving and maintaining a healthier weight. Christopher Burns is interested in becoming our patient and working on intensive lifestyle modifications including (but not limited to) diet and exercise for weight loss.  Christopher Burns likes to [employer] and does not note any obstacles.  He states that he craves sweets.  Christopher Burns's habits were reviewed today and are as follows: His family eats meals together, he thinks his family will eat healthier with him, his desired weight loss is 70 pounds, he started gaining weight after age 38, his heaviest weight ever was 300 pounds, he craves sweets, he snacks frequently in the evenings, he skips breakfist frequently and he frequently makes poor food choices.  Depression Screen Christopher Burns's Food and Mood (modified PHQ-9) score was 4.  Depression screen PHQ 2/9 11/18/2020  Decreased Interest 1  Down, Depressed, Hopeless 0  PHQ - 2 Score 1  Altered sleeping 1  Tired, decreased energy 2  Change in appetite 0  Feeling bad or failure about yourself  0  Trouble concentrating 0  Moving slowly or fidgety/restless 0  Suicidal thoughts 0  PHQ-9 Score 4   Subjective:   1. Other fatigue Christopher Burns denies daytime somnolence and denies waking up still tired. Patent has a history of symptoms of snoring. Christopher Burns generally gets 7 or 8 hours of sleep per night, and states that he has generally restful  sleep. Snoring is present. Apneic episodes are present. Epworth Sleepiness Score is 4.    2. SOB (shortness of breath) on exertion Christopher Burns notes increasing shortness of breath with exercising and seems to be worsening over time with weight gain. He notes getting out of breath sooner with activity than he used to. This has gotten worse recently. Christopher Burns denies shortness of breath at rest or orthopnea.  Occurs with certain activities.   3. Essential hypertension Controlled.  Review: taking medications as instructed, no medication side effects noted, no chest pain on exertion, no dyspnea on exertion, no swelling of ankles.  He is on no medications for this.    BP Readings from Last 3 Encounters:  11/18/20 128/83  11/06/20 118/84  11/03/20 127/81   4. Gastroesophageal reflux disease without esophagitis Controlled.  He is taking Nexium.  5. Mixed hyperlipidemia Christopher Burns has hyperlipidemia and has been trying to improve his cholesterol levels with intensive lifestyle modification including a low saturated fat diet, exercise and weight loss. He denies any chest pain, claudication or myalgias.  Taking simvastatin.  Lab Results  Component Value Date   ALT 41 07/29/2019   AST 33 07/29/2019   ALKPHOS 37 (L) 07/29/2019   BILITOT 0.6 07/29/2019   Lab Results  Component Value Date   CHOL 216 (H) 11/18/2020   HDL 47 11/18/2020   LDLCALC 135 (H) 11/18/2020   TRIG 190 (H) 11/18/2020   6. Elevated glucose Paternal history of diabetes type 2.  Glucose 121.  7. Vitamin D deficiency He is currently  taking OTC vitamin D 1,000 IU daily. He denies nausea, vomiting or muscle weakness.  8. Depression screen Christopher Burns was screened for depression as part of his new patient workup today.  PHQ-9 is 4.  9. At risk for activity intolerance Christopher Burns is at risk for activity intolerance due to obesity and hypogonadism.   Assessment/Plan:   1. Other fatigue Christopher Burns does feel that his weight is causing his energy  to be lower than it should be. Fatigue may be related to obesity, depression or many other causes. Labs will be ordered, and in the meanwhile, Christopher Burns will focus on self care including making healthy food choices, increasing physical activity and focusing on stress reduction.  Gradually increase activities.  Will check thyroid panel today.  - TSH+T4F+T3Free  2. SOB (shortness of breath) on exertion Christopher Burns does feel that he gets out of breath more easily that he used to when he exercises. Christopher Burns's shortness of breath appears to be obesity related and exercise induced. He has agreed to work on weight loss and gradually increase exercise to treat his exercise induced shortness of breath. Will continue to monitor closely.  Will check IC and labs today.  Gradually increase activities.   - TSH+T4F+T3Free  3. Essential hypertension Christopher Burns is working on healthy weight loss and exercise to improve blood pressure control. We will watch for signs of hypotension as he continues his lifestyle modifications.  No added dietary salt, will work on diet and exercise.  4. Gastroesophageal reflux disease without esophagitis Intensive lifestyle modifications are the first line treatment for this issue. We discussed several lifestyle modifications today and he will continue to work on diet, exercise and weight loss efforts. Orders and follow up as documented in patient record.  Continue medication.  Avoid triggers.  Counseling . If a person has gastroesophageal reflux disease (GERD), food and stomach acid move back up into the esophagus and cause symptoms or problems such as damage to the esophagus. . Anti-reflux measures include: raising the head of the bed, avoiding tight clothing or belts, avoiding eating late at night, not lying down shortly after mealtime, and achieving weight loss. . Avoid ASA, NSAID's, caffeine, alcohol, and tobacco.  . OTC Pepcid and/or Tums are often very helpful for as needed use.  Marland Kitchen. However,  for persisting chronic or daily symptoms, stronger medications like Omeprazole may be needed. . You may need to avoid foods and drinks such as: ? Coffee and tea (with or without caffeine). ? Drinks that contain alcohol. ? Energy drinks and sports drinks. ? Bubbly (carbonated) drinks or sodas. ? Chocolate and cocoa. ? Peppermint and mint flavorings. ? Garlic and onions. ? Horseradish. ? Spicy and acidic foods. These include peppers, chili powder, curry powder, vinegar, hot sauces, and BBQ sauce. ? Citrus fruit juices and citrus fruits, such as oranges, lemons, and limes. ? Tomato-based foods. These include red sauce, chili, salsa, and pizza with red sauce. ? Fried and fatty foods. These include donuts, french fries, potato chips, and high-fat dressings. ? High-fat meats. These include hot dogs, rib eye steak, sausage, ham, and bacon.  5. Mixed hyperlipidemia Cardiovascular risk and specific lipid/LDL goals reviewed.  We discussed several lifestyle modifications today and Christopher Burns will continue to work on diet, exercise and weight loss efforts. Orders and follow up as documented in patient record.  Continue medication.  Check lipid panel today.  Counseling Intensive lifestyle modifications are the first line treatment for this issue. . Dietary changes: Increase soluble fiber. Decrease simple carbohydrates. .Marland Kitchen  Exercise changes: Moderate to vigorous-intensity aerobic activity 150 minutes per week if tolerated. . Lipid-lowering medications: see documented in medical record.  - Lipid Panel With LDL/HDL Ratio  6. Elevated glucose Will check A1c and insulin level today.  - Hemoglobin A1c - Insulin, random  7. Vitamin D deficiency Low Vitamin D level contributes to fatigue and are associated with obesity, breast, and colon cancer.  Continue OTC vitamin D and we will check vitamin D level today, as per below.  - VITAMIN D 25 Hydroxy (Vit-D Deficiency, Fractures)  8. Depression  screen Depression screen is negative.  9. At risk for activity intolerance Christopher Burns was given approximately 15 minutes of exercise intolerance counseling today. He is 50 y.o. male and has risk factors exercise intolerance including obesity. We discussed intensive lifestyle modifications today with an emphasis on specific weight loss instructions and strategies. Kazden will slowly increase activity as tolerated.  Repetitive spaced learning was employed today to elicit superior memory formation and behavioral change.  10. Class 3 severe obesity due to excess calories with serious comorbidity and body mass index (BMI) of 40.0 to 44.9 in adult Christopher Burns)  Christopher Burns is currently in the action stage of change and his goal is to continue with weight loss efforts. I recommend Christopher Burns begin the structured treatment plan as follows:  He has agreed to the Category 4 Plan.  He will work on meal planning, mindful eating.  Labs from 10/25/2020, including BMP, glucose, and CBC, were reviewed today.  Exercise goals: No exercise has been prescribed at this time.   Behavioral modification strategies: increasing lean protein intake, decreasing simple carbohydrates, increasing vegetables, increasing water intake, decreasing eating out, no skipping meals, meal planning and cooking strategies, keeping healthy foods in the home, better snacking choices and planning for success.  He was informed of the importance of frequent follow-up visits to maximize his success with intensive lifestyle modifications for his multiple health conditions. He was informed we would discuss his lab results at his next visit unless there is a critical issue that needs to be addressed sooner. Joden agreed to keep his next visit at the agreed upon time to discuss these results.  Objective:   Blood pressure 128/83, pulse 61, temperature 97.7 F (36.5 C), height 5\' 9"  (1.753 m), weight 287 lb (130.2 kg), SpO2 96 %. Body mass index is 42.38  kg/m.  Indirect Calorimeter completed today shows a VO2 of 404 and a REE of 2819.  His calculated basal metabolic rate is thus his basal metabolic rate is better than expected.  General: Cooperative, alert, well developed, in no acute distress. HEENT: Conjunctivae and lids unremarkable. Cardiovascular: Regular rhythm.  Lungs: Normal work of breathing. Neurologic: No focal deficits.   Lab Results  Component Value Date   CREATININE 1.13 10/25/2020   BUN 21 (H) 10/25/2020   NA 135 10/25/2020   K 4.0 10/25/2020   CL 110 10/25/2020   CO2 21 (L) 10/25/2020   Lab Results  Component Value Date   ALT 41 07/29/2019   AST 33 07/29/2019   ALKPHOS 37 (L) 07/29/2019   BILITOT 0.6 07/29/2019   Lab Results  Component Value Date   HGBA1C 6.1 (H) 11/18/2020   Lab Results  Component Value Date   INSULIN 33.4 (H) 11/18/2020   Lab Results  Component Value Date   TSH 1.270 11/18/2020   Lab Results  Component Value Date   CHOL 216 (H) 11/18/2020   HDL 47 11/18/2020   LDLCALC 135 (H)  11/18/2020   TRIG 190 (H) 11/18/2020   Lab Results  Component Value Date   WBC 7.3 10/24/2020   HGB 15.3 10/24/2020   HCT 44.6 10/24/2020   MCV 79.6 (L) 10/24/2020   PLT 192 10/24/2020   Attestation Statements:   Reviewed by clinician on day of visit: allergies, medications, problem list, medical history, surgical history, family history, social history, and previous encounter notes.  I, Insurance claims handler, CMA, am acting as Energy manager for [employer], DO  I have reviewed the above documentation for accuracy and completeness, and I agree with the above. Corinna Capra, DO

## 2020-11-24 ENCOUNTER — Encounter (INDEPENDENT_AMBULATORY_CARE_PROVIDER_SITE_OTHER): Payer: Self-pay | Admitting: Bariatrics

## 2020-11-26 ENCOUNTER — Ambulatory Visit: Payer: 59 | Admitting: Family Medicine

## 2020-12-01 ENCOUNTER — Inpatient Hospital Stay: Payer: 59 | Attending: Oncology | Admitting: Licensed Clinical Social Worker

## 2020-12-01 ENCOUNTER — Other Ambulatory Visit: Payer: Self-pay

## 2020-12-01 ENCOUNTER — Inpatient Hospital Stay: Payer: 59

## 2020-12-01 ENCOUNTER — Encounter: Payer: Self-pay | Admitting: Licensed Clinical Social Worker

## 2020-12-01 DIAGNOSIS — Z8042 Family history of malignant neoplasm of prostate: Secondary | ICD-10-CM | POA: Diagnosis not present

## 2020-12-01 DIAGNOSIS — Z8481 Family history of carrier of genetic disease: Secondary | ICD-10-CM | POA: Diagnosis not present

## 2020-12-01 DIAGNOSIS — Z803 Family history of malignant neoplasm of breast: Secondary | ICD-10-CM

## 2020-12-01 DIAGNOSIS — Z8041 Family history of malignant neoplasm of ovary: Secondary | ICD-10-CM | POA: Insufficient documentation

## 2020-12-01 NOTE — Progress Notes (Signed)
REFERRING PROVIDER: Sindy Guadeloupe, MD [place],  [place] [address]  PRIMARY PROVIDER:  Cletis Athens, MD  PRIMARY REASON FOR VISIT:  1. Family history of BRCA1 gene positive   2. Family history of breast cancer   3. Family history of ovarian cancer   4. Family history of prostate cancer      HISTORY OF PRESENT ILLNESS:   [name], a 50 y.o. male, was seen for a [place] cancer genetics consultation at the request of Dr. Janese Banks due to a family history of BRCA1 mutation.  [name] presents to clinic today to discuss the possibility of a hereditary predisposition to cancer, genetic testing, and to further clarify his future cancer risks, as well as potential cancer risks for family members.   [name] is a 50 y.o. male with no personal history of cancer.    CANCER HISTORY:  Oncology History   No history exists.    Past Medical History:  Diagnosis Date  . Asthma   . Chronic kidney disease   . Complication of anesthesia    HICCUPS FOR 1 WEEK AFTER BICEP SURGERY  . Dizziness   . Family history of BRCA1 gene positive   . Family history of breast cancer   . Family history of ovarian cancer   . Family history of prostate cancer   . GERD (gastroesophageal reflux disease)   . HA (headache)   . Hemorrhoid   . High blood pressure   . High cholesterol   . Hx of blood clots   . Hypercholesteremia   . Polycythemia   . Sleep apnea    NO CPAP    Past Surgical History:  Procedure Laterality Date  . bisep Left 2007  . COLONOSCOPY WITH PROPOFOL N/A 03/01/2019   Procedure: COLONOSCOPY WITH PROPOFOL;  Surgeon: Jonathon Bellows, MD;  Location: [facility] ENDOSCOPY;  Service: Gastroenterology;  Laterality: N/A;  . KNEE SURGERY Right   . LASIK    . PULMONARY THROMBECTOMY Bilateral 10/23/2020   Procedure: PULMONARY THROMBECTOMY;  Surgeon: Algernon Huxley, MD;  Location: [facility] LAB;  Service: Cardiovascular;  Laterality: Bilateral;  . TONSILLECTOMY    . UMBILICAL  HERNIA REPAIR N/A 07/29/2015   Procedure: HERNIA REPAIR UMBILICAL ADULT;  Surgeon: Christene Lye, MD;  Location: [facility] ORS;  Service: General;  Laterality: N/A;  . VASOTOMY      Social History   Socioeconomic History  . Marital status: Married    Spouse name: [name]  . Number of children: 1  . Years of education: 50  . Highest education level: Not on file  Occupational History  . Occupation: work full time    Comment: [employer]  Tobacco Use  . Smoking status: Never Smoker  . Smokeless tobacco: Never Used  Vaping Use  . Vaping Use: Never used  Substance and Sexual Activity  . Alcohol use: Not Currently  . Drug use: No  . Sexual activity: Yes  Other Topics Concern  . Not on file  Social History Narrative   Patient lives at home  with his wife [name]).  Patient works full time for [employer].   Education some college.   Right handed.   Caffeine one shot of colombian  coffee.   Social Determinants of Health   Financial Resource Strain: Not on file  Food Insecurity: Not on file  Transportation Needs: Not on file  Physical Activity: Not on file  Stress: Not on file  Social  Connections: Not on file     FAMILY HISTORY:  We obtained a detailed, 4-generation family history.  Significant diagnoses are listed below: Family History  Problem Relation Age of Onset  . High Cholesterol Mother   . Breast cancer Mother 28  . Diabetes Father   . High blood pressure Father   . High Cholesterol Father   . Prostate cancer Paternal Grandfather   . Cancer Maternal Grandfather        possible stomach cancer  . Cancer Maternal Aunt        possible pancreatic cancer  . Esophageal cancer Maternal Uncle   . Breast cancer Cousin   . Ovarian cancer Cousin    [name] has 1 son and 1 sister. His sister is living at 37 and has tested positive for BRCA1 mutation, no history of cancer. His sister plans to send [place] a copy of her report.  [name] father is  living at 13, no history of cancer. Patient had 2 paternal uncles and 1 aunt. An uncle had a brain tumor but it was not cancerous. No first cousins with cancer. Paternal grandmother's sibling had 2 children who both had cancer, and one of these children had a daughter with lung cancer. Paternal grandfather had prostate cancer late in life.   [name] mother was diagnosed with breast cancer at 15 and passed from it at 54, it was metastatic. She tested positive for a BRCA1 mutation. Patient had 7 maternal uncles and 2 aunts. One of his aunts possibly had pancreatic cancer. An uncle had esophageal cancer. A maternal aunt's two daughters had cancer, one had breast and one had ovarian, and another daughter tested positive for BRCA1 mutation. Maternal grandmother did not have cancer, grandfather possibly had stomach cancer.   [name] is aware of previous family history of genetic testing for hereditary cancer risks. Patient's maternal ancestors are of [place] descent, and paternal ancestors are of [place] descent. There is no reported Ashkenazi Jewish ancestry. There is no known consanguinity.    GENETIC COUNSELING ASSESSMENT: [name] is a 50 y.o. male with a family history of a BRCA1 mutation.  We, therefore, discussed and recommended the following at today's visit.   DISCUSSION: We discussed that approximately 5-10% of cancer is hereditary. We discussed the BRCA1 gene in particular, including cancer risks and management guidelines. He has a 50% chance to have inherited this mutation from his mother. His paternal side of the family is not particularly suggestive of hereditary cancer syndrome but we could consider a panel test. We discussed that testing is beneficial for several reasons including knowing about other cancer risks, identifying potential screening and risk-reduction options that may be appropriate, and to understand if other family members could be at risk for cancer and allow them to  undergo genetic testing.   We reviewed the characteristics, features and inheritance patterns of hereditary cancer syndromes. We also discussed genetic testing, including the appropriate family members to test, the process of testing, insurance coverage and turn-around-time for results. We discussed the implications of a negative, positive and/or variant of uncertain significant result. We recommended Mr. Blando pursue genetic testing for the [facility] Multi-Cancer+RNA gene panel.   The Multi-Cancer Panel + RNA offered by [facility] includes sequencing and/or deletion duplication testing of the following 84 genes: AIP, ALK, APC, ATM, AXIN2,BAP1,  BARD1, BLM, BMPR1A, BRCA1, BRCA2, BRIP1, CASR, CDC73, CDH1, CDK4, CDKN1B, CDKN1C, CDKN2A (p14ARF), CDKN2A (p16INK4a), CEBPA, CHEK2, CTNNA1, DICER1, DIS3L2, EGFR (c.2369C>T, p.Thr790Met variant only),  EPCAM (Deletion/duplication testing only), FH, FLCN, GATA2, GPC3, GREM1 (Promoter region deletion/duplication testing only), HOXB13 (c.251G>A, p.Gly84Glu), HRAS, KIT, MAX, MEN1, MET, MITF (c.952G>A, p.Glu318Lys variant only), MLH1, MSH2, MSH3, MSH6, MUTYH, NBN, NF1, NF2, NTHL1, PALB2, PDGFRA, PHOX2B, PMS2, POLD1, POLE, POT1, PRKAR1A, PTCH1, PTEN, RAD50, RAD51C, RAD51D, RB1, RECQL4, RET, RUNX1, SDHAF2, SDHA (sequence changes only), SDHB, SDHC, SDHD, SMAD4, SMARCA4, SMARCB1, SMARCE1, STK11, SUFU, TERC, TERT, TMEM127, TP53, TSC1, TSC2, VHL, WRN and WT1.  Based on Mr. Yankovich's family history of cancer, he meets medical criteria for genetic testing. Despite that he meets criteria, he may still have an out of pocket cost. We discussed that if his out of pocket cost for testing is over $100, the laboratory will call and confirm whether he wants to proceed with testing.  If the out of pocket cost of testing is less than $100 he will be billed by the genetic testing laboratory.   PLAN: After considering the risks, benefits, and limitations, Mr. Missildine provided informed consent to  pursue genetic testing and the blood sample was sent to [facility] for analysis of the Multi-Cancer Panel+RNA. Results should be available within approximately 2-3 weeks' time, at which point they will be disclosed by telephone to Mr. Machuca, as will any additional recommendations warranted by these results. Mr. Boylen will receive a summary of his genetic counseling visit and a copy of his results once available. This information will also be available in Epic.   Mr. Carn questions were answered to his satisfaction today. Our contact information was provided should additional questions or concerns arise. Thank you for the referral and allowing [place] to share in the care of your patient.   Faith Rogue, MS, [facility] Genetic Counselor [email or web address] Phone: [phone]  The patient was seen for a total of 35 minutes in face-to-face genetic counseling. Patient's wife  [name] was also present.  Dr. Grayland Ormond was available for discussion regarding this case.   _______________________________________________________________________ For Office Staff:  Number of people involved in session: 2 Was an Intern/ student involved with case: no

## 2020-12-02 ENCOUNTER — Ambulatory Visit (INDEPENDENT_AMBULATORY_CARE_PROVIDER_SITE_OTHER): Payer: 59 | Admitting: Bariatrics

## 2020-12-02 ENCOUNTER — Encounter (INDEPENDENT_AMBULATORY_CARE_PROVIDER_SITE_OTHER): Payer: Self-pay | Admitting: Bariatrics

## 2020-12-02 VITALS — BP 127/83 | HR 65 | Temp 97.9°F | Ht 69.0 in | Wt 286.0 lb

## 2020-12-02 DIAGNOSIS — Z6841 Body Mass Index (BMI) 40.0 and over, adult: Secondary | ICD-10-CM

## 2020-12-02 DIAGNOSIS — I10 Essential (primary) hypertension: Secondary | ICD-10-CM | POA: Diagnosis not present

## 2020-12-02 DIAGNOSIS — R7303 Prediabetes: Secondary | ICD-10-CM

## 2020-12-02 DIAGNOSIS — E7849 Other hyperlipidemia: Secondary | ICD-10-CM

## 2020-12-03 ENCOUNTER — Encounter (INDEPENDENT_AMBULATORY_CARE_PROVIDER_SITE_OTHER): Payer: Self-pay | Admitting: Bariatrics

## 2020-12-03 NOTE — Progress Notes (Signed)
Chief Complaint:   OBESITY Christopher Burns is here to discuss his progress with his obesity treatment plan along with follow-up of his obesity related diagnoses. Christopher Burns is on the Category 4 Plan and states he is following his eating plan approximately 95% of the time. Christopher Burns states he is lifting weights, doing cardio, and walking for 90 minutes 5 times per week.  Today's visit was #: 2 Starting weight: 287 lbs Starting date: 11/18/2020 Today's weight: 286 lbs Today's date: 12/02/2020 Total lbs lost to date: 1 lb Total lbs lost since last in-office visit: 1 lb  Interim History: Christopher Burns is down 1 pound.  He gets hungry mid morning and at lunch.  Subjective:   1. Prediabetes Christopher Burns has a diagnosis of prediabetes based on his elevated HgA1c and was informed this puts him at greater risk of developing diabetes. He continues to work on diet and exercise to decrease his risk of diabetes. He denies nausea or hypoglycemia.  A1c 6.1, insulin 33.4.  Lab Results  Component Value Date   HGBA1C 6.1 (H) 11/18/2020   Lab Results  Component Value Date   INSULIN 33.4 (H) 11/18/2020   2. Essential hypertension Controlled.  Review: taking medications as instructed, no medication side effects noted, no chest pain on exertion, no dyspnea on exertion, no swelling of ankles.    BP Readings from Last 3 Encounters:  12/02/20 127/83  11/18/20 128/83  11/06/20 118/84   3. Other hyperlipidemia Christopher Burns has hyperlipidemia and has been trying to improve his cholesterol levels with intensive lifestyle modification including a low saturated fat diet, exercise and weight loss. He denies any chest pain, claudication or myalgias.  Taking Lovaza.  Lab Results  Component Value Date   ALT 41 07/29/2019   AST 33 07/29/2019   ALKPHOS 37 (L) 07/29/2019   BILITOT 0.6 07/29/2019   Lab Results  Component Value Date   CHOL 216 (H) 11/18/2020   HDL 47 11/18/2020   LDLCALC 135 (H) 11/18/2020   TRIG 190 (H) 11/18/2020    Assessment/Plan:   1. Prediabetes Christopher Burns will continue to work on weight loss, exercise, and decreasing simple carbohydrates to help decrease the risk of diabetes.  Continue activity, decrease carbohydrates.  Handout on prediabetes provided today.  2. Essential hypertension Christopher Burns is working on healthy weight loss and exercise to improve blood pressure control. We will watch for signs of hypotension as he continues his lifestyle modifications.  Continue medication.  3. Other hyperlipidemia Cardiovascular risk and specific lipid/LDL goals reviewed.  We discussed several lifestyle modifications today and Christopher Burns will continue to work on diet, exercise and weight loss efforts. Orders and follow up as documented in patient record.  Continue Lovaza.   Counseling Intensive lifestyle modifications are the first line treatment for this issue. . Dietary changes: Increase soluble fiber. Decrease simple carbohydrates. . Exercise changes: Moderate to vigorous-intensity aerobic activity 150 minutes per week if tolerated. . Lipid-lowering medications: see documented in medical record.  4. Obesity, current BMI 42  Christopher Burns is currently in the action stage of change. As such, his goal is to continue with weight loss efforts. He has agreed to keeping a food journal and adhering to recommended goals of 1800 calories and 100-120 grams of protein.   He will work on meal planning.  Labs from 11/18/2020, including lipids, vitamin D, A1c, and thyroid panel, were discussed today.  Exercise goals: Continue exercise.  Behavioral modification strategies: increasing lean protein intake, decreasing simple carbohydrates, increasing vegetables, increasing water intake,  decreasing eating out, no skipping meals, meal planning and cooking strategies, keeping healthy foods in the home and planning for success.  Christopher Burns has agreed to follow-up with our clinic in 2 weeks with Christopher Cliche, PA-C or Christopher Salvage, FNP. He was  informed of the importance of frequent follow-up visits to maximize his success with intensive lifestyle modifications for his multiple health conditions.   Objective:   Blood pressure 127/83, pulse 65, temperature 97.9 F (36.6 C), height 5\' 9"  (1.753 m), weight 286 lb (129.7 kg), SpO2 98 %. Body mass index is 42.23 kg/m.  General: Cooperative, alert, well developed, in no acute distress. HEENT: Conjunctivae and lids unremarkable. Cardiovascular: Regular rhythm.  Lungs: Normal work of breathing. Neurologic: No focal deficits.   Lab Results  Component Value Date   CREATININE 1.13 10/25/2020   BUN 21 (H) 10/25/2020   NA 135 10/25/2020   K 4.0 10/25/2020   CL 110 10/25/2020   CO2 21 (L) 10/25/2020   Lab Results  Component Value Date   ALT 41 07/29/2019   AST 33 07/29/2019   ALKPHOS 37 (L) 07/29/2019   BILITOT 0.6 07/29/2019   Lab Results  Component Value Date   HGBA1C 6.1 (H) 11/18/2020   Lab Results  Component Value Date   INSULIN 33.4 (H) 11/18/2020   Lab Results  Component Value Date   TSH 1.270 11/18/2020   Lab Results  Component Value Date   CHOL 216 (H) 11/18/2020   HDL 47 11/18/2020   LDLCALC 135 (H) 11/18/2020   TRIG 190 (H) 11/18/2020   Lab Results  Component Value Date   WBC 7.3 10/24/2020   HGB 15.3 10/24/2020   HCT 44.6 10/24/2020   MCV 79.6 (L) 10/24/2020   PLT 192 10/24/2020   Attestation Statements:   Reviewed by clinician on day of visit: allergies, medications, problem list, medical history, surgical history, family history, social history, and previous encounter notes.  I, 12/24/2020, CMA, am acting as Insurance claims handler for Energy manager, DO  I have reviewed the above documentation for accuracy and completeness, and I agree with the above. [employer], DO

## 2020-12-21 ENCOUNTER — Ambulatory Visit (INDEPENDENT_AMBULATORY_CARE_PROVIDER_SITE_OTHER): Payer: 59 | Admitting: Physician Assistant

## 2020-12-24 ENCOUNTER — Encounter (INDEPENDENT_AMBULATORY_CARE_PROVIDER_SITE_OTHER): Payer: Self-pay | Admitting: Physician Assistant

## 2020-12-24 ENCOUNTER — Ambulatory Visit (INDEPENDENT_AMBULATORY_CARE_PROVIDER_SITE_OTHER): Payer: 59 | Admitting: Physician Assistant

## 2020-12-24 ENCOUNTER — Other Ambulatory Visit: Payer: Self-pay

## 2020-12-24 VITALS — BP 135/77 | HR 84 | Temp 98.1°F | Ht 69.0 in | Wt 286.0 lb

## 2020-12-24 DIAGNOSIS — Z6841 Body Mass Index (BMI) 40.0 and over, adult: Secondary | ICD-10-CM

## 2020-12-24 DIAGNOSIS — Z9189 Other specified personal risk factors, not elsewhere classified: Secondary | ICD-10-CM

## 2020-12-24 DIAGNOSIS — R7303 Prediabetes: Secondary | ICD-10-CM | POA: Diagnosis not present

## 2020-12-24 MED ORDER — METFORMIN HCL 500 MG PO TABS: 500.0000 mg | ORAL_TABLET | Freq: Every day | ORAL | 0 refills | Status: DC

## 2020-12-28 NOTE — Progress Notes (Signed)
Chief Complaint:   OBESITY Christopher Burns is here to discuss his progress with his obesity treatment plan along with follow-up of his obesity related diagnoses. Christopher Burns is on keeping a food journal and adhering to recommended goals of 1800 calories and 100-120 grams of protein daily and states he is following his eating plan approximately 100% of the time. Christopher Burns states he is walking and doing weights for 45-60 minutes 4 times per week.  Today's visit was #: 3 Starting weight: 287 lbs Starting date: 11/18/2020 Today's weight: 286 lbs Today's date: 12/24/2020 Total lbs lost to date: 1 Total lbs lost since last in-office visit: 0  Interim History: Christopher Burns is journaling intermittently. He is not weighing his protein. He denies excessive hunger.  Subjective:   1. Pre-diabetes Christopher Burns is not on medications, and he denies polyphagia. His last A1c was 6.1.  2. At risk for diabetes mellitus Christopher Burns is at higher than average risk for developing diabetes due to obesity.   Assessment/Plan:   1. Pre-diabetes Christopher Burns agreed to start metformin 500 mg q AM with breakfast, with no refills. He will continue to work on weight loss, exercise, and decreasing simple carbohydrates to help decrease the risk of diabetes.   - metFORMIN (GLUCOPHAGE) 500 MG tablet; Take 1 tablet (500 mg total) by mouth daily with breakfast.  Dispense: 30 tablet; Refill: 0  2. At risk for diabetes mellitus Christopher Burns was given approximately 15 minutes of diabetes education and counseling today. We discussed intensive lifestyle modifications today with an emphasis on weight loss as well as increasing exercise and decreasing simple carbohydrates in his diet. We also reviewed medication options with an emphasis on risk versus benefit of those discussed.   Repetitive spaced learning was employed today to elicit superior memory formation and behavioral change.  3. Class 3 severe obesity with serious comorbidity and body mass index (BMI) of  40.0 to 44.9 in adult, unspecified obesity type [facility]) Christopher Burns is currently in the action stage of change. As such, his goal is to continue with weight loss efforts. He has agreed to the Category 4 Plan + 200 protein calories.   Exercise goals: As is.  Behavioral modification strategies: increasing lean protein intake and meal planning and cooking strategies.  Christopher Burns has agreed to follow-up with our clinic in 2 weeks. He was informed of the importance of frequent follow-up visits to maximize his success with intensive lifestyle modifications for his multiple health conditions.   Objective:   Blood pressure 135/77, pulse 84, temperature 98.1 F (36.7 C), height 5\' 9"  (1.753 m), weight 286 lb (129.7 kg), SpO2 96 %. Body mass index is 42.23 kg/m.  General: Cooperative, alert, well developed, in no acute distress. HEENT: Conjunctivae and lids unremarkable. Cardiovascular: Regular rhythm.  Lungs: Normal work of breathing. Neurologic: No focal deficits.   Lab Results  Component Value Date   CREATININE 1.13 10/25/2020   BUN 21 (H) 10/25/2020   NA 135 10/25/2020   K 4.0 10/25/2020   CL 110 10/25/2020   CO2 21 (L) 10/25/2020   Lab Results  Component Value Date   ALT 41 07/29/2019   AST 33 07/29/2019   ALKPHOS 37 (L) 07/29/2019   BILITOT 0.6 07/29/2019   Lab Results  Component Value Date   HGBA1C 6.1 (H) 11/18/2020   Lab Results  Component Value Date   INSULIN 33.4 (H) 11/18/2020   Lab Results  Component Value Date   TSH 1.270 11/18/2020   Lab Results  Component Value Date  CHOL 216 (H) 11/18/2020   HDL 47 11/18/2020   LDLCALC 135 (H) 11/18/2020   TRIG 190 (H) 11/18/2020   Lab Results  Component Value Date   WBC 7.3 10/24/2020   HGB 15.3 10/24/2020   HCT 44.6 10/24/2020   MCV 79.6 (L) 10/24/2020   PLT 192 10/24/2020   No results found for: IRON, TIBC, FERRITIN  Attestation Statements:   Reviewed by clinician on day of visit: allergies, medications, problem  list, medical history, surgical history, family history, social history, and previous encounter notes.   Trude Mcburney, am acting as transcriptionist for [employer], PA-C.  I have reviewed the above documentation for accuracy and completeness, and I agree with the above. Alois Cliche, PA-C

## 2020-12-29 ENCOUNTER — Telehealth: Payer: Self-pay | Admitting: Licensed Clinical Social Worker

## 2020-12-29 NOTE — Telephone Encounter (Signed)
Revealed BRCA1 mutation identified. Discussed this briefly, made appointment to discuss in detail on 5/24 at 2 pm so that his wife can join [place].

## 2020-12-31 ENCOUNTER — Other Ambulatory Visit: Payer: Self-pay

## 2020-12-31 ENCOUNTER — Inpatient Hospital Stay: Payer: 59 | Attending: Oncology

## 2020-12-31 DIAGNOSIS — Z801 Family history of malignant neoplasm of trachea, bronchus and lung: Secondary | ICD-10-CM | POA: Diagnosis not present

## 2020-12-31 DIAGNOSIS — Z1501 Genetic susceptibility to malignant neoplasm of breast: Secondary | ICD-10-CM | POA: Insufficient documentation

## 2020-12-31 DIAGNOSIS — Z809 Family history of malignant neoplasm, unspecified: Secondary | ICD-10-CM | POA: Diagnosis not present

## 2020-12-31 DIAGNOSIS — Z800 Family history of malignant neoplasm of digestive organs: Secondary | ICD-10-CM | POA: Insufficient documentation

## 2020-12-31 DIAGNOSIS — N189 Chronic kidney disease, unspecified: Secondary | ICD-10-CM | POA: Insufficient documentation

## 2020-12-31 DIAGNOSIS — Z1509 Genetic susceptibility to other malignant neoplasm: Secondary | ICD-10-CM | POA: Insufficient documentation

## 2020-12-31 DIAGNOSIS — D751 Secondary polycythemia: Secondary | ICD-10-CM | POA: Insufficient documentation

## 2020-12-31 DIAGNOSIS — I2699 Other pulmonary embolism without acute cor pulmonale: Secondary | ICD-10-CM | POA: Insufficient documentation

## 2020-12-31 DIAGNOSIS — I129 Hypertensive chronic kidney disease with stage 1 through stage 4 chronic kidney disease, or unspecified chronic kidney disease: Secondary | ICD-10-CM | POA: Insufficient documentation

## 2020-12-31 DIAGNOSIS — Z7984 Long term (current) use of oral hypoglycemic drugs: Secondary | ICD-10-CM | POA: Insufficient documentation

## 2020-12-31 DIAGNOSIS — D6859 Other primary thrombophilia: Secondary | ICD-10-CM | POA: Insufficient documentation

## 2020-12-31 DIAGNOSIS — Z79899 Other long term (current) drug therapy: Secondary | ICD-10-CM | POA: Insufficient documentation

## 2020-12-31 DIAGNOSIS — Z7901 Long term (current) use of anticoagulants: Secondary | ICD-10-CM | POA: Diagnosis not present

## 2020-12-31 DIAGNOSIS — I82431 Acute embolism and thrombosis of right popliteal vein: Secondary | ICD-10-CM | POA: Diagnosis present

## 2020-12-31 DIAGNOSIS — Z803 Family history of malignant neoplasm of breast: Secondary | ICD-10-CM | POA: Diagnosis not present

## 2020-12-31 DIAGNOSIS — Z148 Genetic carrier of other disease: Secondary | ICD-10-CM | POA: Insufficient documentation

## 2020-12-31 DIAGNOSIS — Z8042 Family history of malignant neoplasm of prostate: Secondary | ICD-10-CM | POA: Diagnosis not present

## 2020-12-31 DIAGNOSIS — E7800 Pure hypercholesterolemia, unspecified: Secondary | ICD-10-CM | POA: Insufficient documentation

## 2020-12-31 LAB — ANTITHROMBIN III: AntiThromb III Func: 99 % (ref 75–120)

## 2021-01-01 LAB — HEX PHASE PHOSPHOLIPID REFLEX

## 2021-01-01 LAB — BETA-2-GLYCOPROTEIN I ABS, IGG/M/A
Beta-2 Glyco I IgG: <9 GPI IgG units (ref 0–20)
Beta-2-Glycoprotein I IgA: <9 GPI IgA units (ref 0–25)
Beta-2-Glycoprotein I IgM: <9 GPI IgM units (ref 0–32)

## 2021-01-01 LAB — LUPUS ANTICOAGULANT
DRVVT: 48.9 s — ABNORMAL HIGH (ref 0.0–47.0)
PTT Lupus Anticoagulant: 30 s (ref 0.0–51.9)
Thrombin Time: 20.3 s (ref 0.0–23.0)
dPT Confirm Ratio: 1.11 Ratio (ref 0.00–1.34)
dPT: 36.7 s (ref 0.0–47.6)

## 2021-01-01 LAB — PROTEIN S PANEL
Protein S Activity: 103 % (ref 63–140)
Protein S Ag, Free: 118 % (ref 61–136)
Protein S Ag, Total: 115 % (ref 60–150)

## 2021-01-01 LAB — PROTEIN C, TOTAL: Protein C, Total: 106 % (ref 60–150)

## 2021-01-01 LAB — DRVVT CONFIRM: dRVVT Confirm: 1 ratio (ref 0.8–1.2)

## 2021-01-01 LAB — DRVVT MIX: dRVVT Mix: 45.1 s — ABNORMAL HIGH (ref 0.0–40.4)

## 2021-01-01 LAB — HEXAGONAL PHASE PHOSPHOLIPID: Hex Phosph Neut Test: 3 s (ref 0–11)

## 2021-01-03 LAB — CARDIOLIPIN ANTIBODIES, IGG, IGM, IGA
Anticardiolipin IgA: <9 APL U/mL (ref 0–11)
Anticardiolipin IgG: <9 GPL U/mL (ref 0–14)
Anticardiolipin IgM: 14 MPL U/mL — ABNORMAL HIGH (ref 0–12)

## 2021-01-05 ENCOUNTER — Inpatient Hospital Stay: Payer: 59 | Admitting: Licensed Clinical Social Worker

## 2021-01-06 LAB — FACTOR 5 LEIDEN

## 2021-01-07 ENCOUNTER — Other Ambulatory Visit: Payer: 59

## 2021-01-07 ENCOUNTER — Inpatient Hospital Stay (HOSPITAL_BASED_OUTPATIENT_CLINIC_OR_DEPARTMENT_OTHER): Payer: 59 | Admitting: Oncology

## 2021-01-07 DIAGNOSIS — I2609 Other pulmonary embolism with acute cor pulmonale: Secondary | ICD-10-CM

## 2021-01-07 DIAGNOSIS — I2782 Chronic pulmonary embolism: Secondary | ICD-10-CM | POA: Diagnosis not present

## 2021-01-08 LAB — PROTHROMBIN GENE MUTATION

## 2021-01-09 ENCOUNTER — Encounter: Payer: Self-pay | Admitting: Oncology

## 2021-01-09 NOTE — Progress Notes (Signed)
I connected with Christopher Burns on 01/09/21 at  4:00 PM EDT by video enabled telemedicine visit and verified that I am speaking with the correct person using two identifiers.   I discussed the limitations, risks, security and privacy concerns of performing an evaluation and management service by telemedicine and the availability of in-person appointments. I also discussed with the patient that there may be a patient responsible charge related to this service. The patient expressed understanding and agreed to proceed.  Other persons participating in the visit and their role in the encounter:  Patients wife  Patient's location:  home Provider's location:  work  Risk analyst Complaint: Discuss results of hypercoagulable work-up  History of present illness: Patient is a 50 year old male with past medical history significant for testosterone induced secondary polycythemia and was last seen by me in May 2021.  He has required a few phlebotomy sessions but following that he stopped taking testosterone and his hemoglobin normalized.  JAK2 mutation testing was negative and EPO levels were normal.  More recently patient Had symptoms of exertional shortness of breath and chest palpitations for which she was admitted to the hospital and was found to have right popliteal vein DVT as well as bilateral PE with right heart strain requiring thrombectomy.  He was discharged on Eliquis.  Results of hypercoagulable work-up including protein C protein/Antithrombin III levels, factor V Leiden, prothrombin gene mutation and antiphospholipid antibody panel were all negative  Interval history: Patient is doing well and continues to take Eliquis without any significant issues   Review of Systems  Constitutional: Negative for chills, fever, malaise/fatigue and weight loss.  HENT: Negative for congestion, ear discharge and nosebleeds.   Eyes: Negative for blurred vision.  Respiratory: Negative for cough, hemoptysis, sputum  production, shortness of breath and wheezing.   Cardiovascular: Negative for chest pain, palpitations, orthopnea and claudication.  Gastrointestinal: Negative for abdominal pain, blood in stool, constipation, diarrhea, heartburn, melena, nausea and vomiting.  Genitourinary: Negative for dysuria, flank pain, frequency, hematuria and urgency.  Musculoskeletal: Negative for back pain, joint pain and myalgias.  Skin: Negative for rash.  Neurological: Negative for dizziness, tingling, focal weakness, seizures, weakness and headaches.  Endo/Heme/Allergies: Does not bruise/bleed easily.  Psychiatric/Behavioral: Negative for depression and suicidal ideas. The patient does not have insomnia.     Allergies  Allergen Reactions  . Other Itching and Rash    Pt was tested and positive for feline, canine and cockroaches. Pt has rash, itching, watery eyes. And is on allergy injections since 2017    Past Medical History:  Diagnosis Date  . Asthma   . Chronic kidney disease   . Complication of anesthesia    HICCUPS FOR 1 WEEK AFTER BICEP SURGERY  . Dizziness   . Family history of BRCA1 gene positive   . Family history of breast cancer   . Family history of ovarian cancer   . Family history of prostate cancer   . GERD (gastroesophageal reflux disease)   . HA (headache)   . Hemorrhoid   . High blood pressure   . High cholesterol   . Hx of blood clots   . Hypercholesteremia   . Polycythemia   . Sleep apnea    NO CPAP    Past Surgical History:  Procedure Laterality Date  . bisep Left 2007  . COLONOSCOPY WITH PROPOFOL N/A 03/01/2019   Procedure: COLONOSCOPY WITH PROPOFOL;  Surgeon: Jonathon Bellows, MD;  Location: [facility] ENDOSCOPY;  Service: Gastroenterology;  Laterality: N/A;  . KNEE  SURGERY Right   . LASIK    . PULMONARY THROMBECTOMY Bilateral 10/23/2020   Procedure: PULMONARY THROMBECTOMY;  Surgeon: Algernon Huxley, MD;  Location: [facility];  Service: Cardiovascular;  Laterality:  Bilateral;  . TONSILLECTOMY    . UMBILICAL HERNIA REPAIR N/A 07/29/2015   Procedure: HERNIA REPAIR UMBILICAL ADULT;  Surgeon: Christene Lye, MD;  Location: [facility] ORS;  Service: General;  Laterality: N/A;  . VASOTOMY      Social History   Socioeconomic History  . Marital status: Married    Spouse name: [name]  . Number of children: 1  . Years of education: 47  . Highest education level: Not on file  Occupational History  . Occupation: work full time    Comment: [employer]  Tobacco Use  . Smoking status: Never Smoker  . Smokeless tobacco: Never Used  Vaping Use  . Vaping Use: Never used  Substance and Sexual Activity  . Alcohol use: Not Currently  . Drug use: No  . Sexual activity: Yes  Other Topics Concern  . Not on file  Social History Narrative   Patient lives at home  with his wife [name]).  Patient works full time for [employer].   Education some college.   Right handed.   Caffeine one shot of colombian  coffee.   Social Determinants of Health   Financial Resource Strain: Not on file  Food Insecurity: Not on file  Transportation Needs: Not on file  Physical Activity: Not on file  Stress: Not on file  Social Connections: Not on file  Intimate Partner Violence: Not on file    Family History  Problem Relation Age of Onset  . High Cholesterol Mother   . Breast cancer Mother 48  . Diabetes Father   . High blood pressure Father   . High Cholesterol Father   . Prostate cancer Paternal Grandfather   . Cancer Maternal Grandfather        possible stomach cancer  . Cancer Maternal Aunt        possible pancreatic cancer  . Esophageal cancer Maternal Uncle   . Breast cancer Cousin   . Ovarian cancer Cousin      Current Outpatient Medications:  .  apixaban (ELIQUIS) 5 MG TABS tablet, Take 1 tablet (5 mg total) by mouth 2 (two) times daily., Disp: 60 tablet, Rfl: 0 .  cetirizine (ZYRTEC) 10 MG tablet, Take 10 mg by mouth daily., Disp: ,  Rfl:  .  cholecalciferol (VITAMIN D) 25 MCG (1000 UNIT) tablet, Take 1,000 Units by mouth daily., Disp: , Rfl:  .  esomeprazole (NEXIUM) 40 MG capsule, Take 40 mg by mouth daily. , Disp: , Rfl:  .  metFORMIN (GLUCOPHAGE) 500 MG tablet, Take 1 tablet (500 mg total) by mouth daily with breakfast., Disp: 30 tablet, Rfl: 0 .  montelukast (SINGULAIR) 10 MG tablet, Take 10 mg by mouth at bedtime. , Disp: , Rfl: 11 .  Multiple Vitamins-Minerals (MULTIVITAMIN WITH MINERALS) tablet, Take 1 tablet by mouth daily., Disp: , Rfl:  .  omega-3 acid ethyl esters (LOVAZA) 1 G capsule, Take 1 g by mouth daily., Disp: , Rfl:  .  simvastatin (ZOCOR) 10 MG tablet, TAKE 1 TABLET BY MOUTH EVERYDAY AT BEDTIME, Disp: 30 tablet, Rfl: 3 .  vitamin B-12 (CYANOCOBALAMIN) 100 MCG tablet, Take 100 mcg by mouth daily., Disp: , Rfl:  .  vitamin C (ASCORBIC ACID) 500 MG tablet, Take 500 mg by mouth daily.,  Disp: , Rfl:  .  zinc gluconate 50 MG tablet, Take 50 mg by mouth daily., Disp: , Rfl:   No results found.  No images are attached to the encounter.   CMP Latest Ref Rng & Units 10/25/2020  Glucose 70 - 99 mg/dL 121(H)  BUN 6 - 20 mg/dL 21(H)  Creatinine 0.61 - 1.24 mg/dL 1.13  Sodium 135 - 145 mmol/L 135  Potassium 3.5 - 5.1 mmol/L 4.0  Chloride 98 - 111 mmol/L 110  CO2 22 - 32 mmol/L 21(L)  Calcium 8.9 - 10.3 mg/dL 8.4(L)  Total Protein 6.5 - 8.1 g/dL -  Total Bilirubin 0.3 - 1.2 mg/dL -  Alkaline Phos 38 - 126 U/L -  AST 15 - 41 U/L -  ALT 0 - 44 U/L -   CBC Latest Ref Rng & Units 10/24/2020  WBC 4.0 - 10.5 K/uL 7.3  Hemoglobin 13.0 - 17.0 g/dL 15.3  Hematocrit 39.0 - 52.0 % 44.6  Platelets 150 - 400 K/uL 192     Observation/objective:Appears in no acute distress over video visit today.  Breathing is nonlabored  Assessment and plan: Patient is a 50 year old male with prior history of secondary polycythemia from testosterone replacement therapy who is now off testosterone.  He had a history of popliteal  vein thrombus and bilateral PE with right heart strain in March 2022.  He is currently on Eliquis and this is a follow-up visit to discuss hypercoagulable work-up  Results of hypercoagulable work-up did not reveal any abnormality.  His bilateral PE and popliteal vein thrombus therefore appears to be unprovoked.  Ideally patient needs to stay on lifelong anticoagulation.  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  He therefore desires to come off blood thinners after 6 months.  I recommended that he should definitely stay on a blood thinner at least until September 2022 and following that we will continue to observe him off blood thinners.  If he were to have any recurrent thromboembolic episodes in the future, that would make a strong case to continue blood thinners lifelong.  Patient was noted to have reduction in the right ventricular function based on his echocardiogram in March 2022.He subsequently underwent a thrombectomy.  I would like to repeat his echocardiogram in September 2022 to ensure return of normal heart function  Follow-up instructions: CBC with differential CMP and echocardiogram in mid September 2022  I discussed the assessment and treatment plan with the patient. The patient was provided an opportunity to ask questions and all were answered. The patient agreed with the plan and demonstrated an understanding of the instructions.   The patient was advised to call back or seek an in-person evaluation if the symptoms worsen or if the condition fails to improve as anticipated.  Visit Diagnosis: 1. 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 Randa Evens, MD, MPH [facility] at [facility] Tel- [phone] 01/09/2021 8:07 AM

## 2021-01-11 ENCOUNTER — Other Ambulatory Visit: Payer: Self-pay | Admitting: *Deleted

## 2021-01-11 DIAGNOSIS — D751 Secondary polycythemia: Secondary | ICD-10-CM

## 2021-01-11 DIAGNOSIS — I5189 Other ill-defined heart diseases: Secondary | ICD-10-CM

## 2021-01-11 DIAGNOSIS — I2699 Other pulmonary embolism without acute cor pulmonale: Secondary | ICD-10-CM

## 2021-01-12 ENCOUNTER — Encounter: Payer: Self-pay | Admitting: Licensed Clinical Social Worker

## 2021-01-12 ENCOUNTER — Inpatient Hospital Stay: Payer: 59 | Admitting: Licensed Clinical Social Worker

## 2021-01-12 DIAGNOSIS — Z1501 Genetic susceptibility to malignant neoplasm of breast: Secondary | ICD-10-CM

## 2021-01-12 DIAGNOSIS — Z8041 Family history of malignant neoplasm of ovary: Secondary | ICD-10-CM

## 2021-01-12 DIAGNOSIS — Z8042 Family history of malignant neoplasm of prostate: Secondary | ICD-10-CM

## 2021-01-12 DIAGNOSIS — Z1509 Genetic susceptibility to other malignant neoplasm: Secondary | ICD-10-CM | POA: Insufficient documentation

## 2021-01-12 DIAGNOSIS — Z1379 Encounter for other screening for genetic and chromosomal anomalies: Secondary | ICD-10-CM | POA: Insufficient documentation

## 2021-01-12 DIAGNOSIS — Z8481 Family history of carrier of genetic disease: Secondary | ICD-10-CM

## 2021-01-12 DIAGNOSIS — Z803 Family history of malignant neoplasm of breast: Secondary | ICD-10-CM

## 2021-01-12 NOTE — Progress Notes (Signed)
Genetic Test Results  HPI:  Christopher Burns was previously seen in the [place] [facility] due to a family history of cancer, family history of BRCA1 mutation and concerns regarding a hereditary predisposition to cancer. Please refer to our prior cancer genetics clinic note for more information regarding our discussion, assessment and recommendations, at the time. Christopher Burns recent genetic test results were disclosed to him, as were recommendations warranted by these results. These results and recommendations are discussed in more detail below.  CANCER HISTORY:  Oncology History   No history exists.    FAMILY HISTORY:  We obtained a detailed, 4-generation family history.  Significant diagnoses are listed below: Family History  Problem Relation Age of Onset  . High Cholesterol Mother   . Breast cancer Mother 39  . Diabetes Father   . High blood pressure Father   . High Cholesterol Father   . Prostate cancer Paternal Grandfather   . Cancer Maternal Grandfather        possible stomach cancer  . Cancer Maternal Aunt        possible pancreatic cancer  . Esophageal cancer Maternal Uncle   . Breast cancer Cousin   . Ovarian cancer Cousin     Christopher Burns has 1 son and 1 sister. His sister is living at 61 and has tested positive for BRCA1 mutation, no history of cancer. His sister plans to send [place] a copy of her report.  Christopher Burns father is living at 68, no history of cancer. Patient had 2 paternal uncles and 1 aunt. An uncle had a brain tumor but it was not cancerous. No first cousins with cancer. Paternal grandmother's sibling had 2 children who both had cancer, and one of these children had a daughter with lung cancer. Paternal grandfather had prostate cancer late in life.   Christopher Burns mother was diagnosed with breast cancer at 80 and passed from it at 52, it was metastatic. She tested positive for a BRCA1 mutation. Patient had 7 maternal uncles and 2 aunts. One of  his aunts possibly had pancreatic cancer. An uncle had esophageal cancer. A maternal aunt's two daughters had cancer, one had breast and one had ovarian, and another daughter tested positive for BRCA1 mutation. Maternal grandmother did not have cancer, grandfather possibly had stomach cancer.   Christopher Burns. Matera is aware of previous family history of genetic testing for hereditary cancer risks. Patient's maternal ancestors are of [place] descent, and paternal ancestors are of [place] descent. There is no reported Ashkenazi Jewish ancestry. There is no known consanguinity.     GENETIC TEST RESULTS: Genetic testing reported out on 01/12/2021 through the [facility] Multi- cancer panel found a single, pathogenic variant in BRCA1 called c.1961del.  The remainder of testing was negative/normal.   The Multi-Cancer Panel + RNA offered by [facility] includes sequencing and/or deletion duplication testing of the following 84 genes: AIP, ALK, APC, ATM, AXIN2,BAP1,  BARD1, BLM, BMPR1A, BRCA1, BRCA2, BRIP1, CASR, CDC73, CDH1, CDK4, CDKN1B, CDKN1C, CDKN2A (p14ARF), CDKN2A (p16INK4a), CEBPA, CHEK2, CTNNA1, DICER1, DIS3L2, EGFR (c.2369C>T, p.Thr790Met variant only), EPCAM (Deletion/duplication testing only), FH, FLCN, GATA2, GPC3, GREM1 (Promoter region deletion/duplication testing only), HOXB13 (c.251G>A, p.Gly84Glu), HRAS, KIT, MAX, MEN1, MET, MITF (c.952G>A, p.Glu318Lys variant only), MLH1, MSH2, MSH3, MSH6, MUTYH, NBN, NF1, NF2, NTHL1, PALB2, PDGFRA, PHOX2B, PMS2, POLD1, POLE, POT1, PRKAR1A, PTCH1, PTEN, RAD50, RAD51C, RAD51D, RB1, RECQL4, RET, RUNX1, SDHAF2, SDHA (sequence changes only), SDHB, SDHC, SDHD, SMAD4, SMARCA4, SMARCB1, SMARCE1, STK11, SUFU, TERC, TERT, TMEM127, TP53, TSC1,  TSC2, VHL, WRN and WT1.  The test report has been scanned into EPIC and is located under the Molecular Pathology section of the Results Review tab.  A portion of the result report is included below for reference.     DISCUSSION: BRCA1   We  discussed the cancers, inheritance, management asscociated with BRCA1, and the importance of telling family members about this result.   HBOC syndrome is characterized by an increased lifetime risk for generally adult-onset cancers including breast, contralateral breast, male breast, ovarian, prostate and pancreatic.   Cancers associated with BRCA1: -Breast cancer, up to 87% risk -Male breast cancer, 1-2% risk -Ovarian cancer, up to 54% risk -Pancreatic cancer, 1-3% risk -Prostate cancer, elevated -Preliminary evidence for association with melanoma   Inheritance Hereditary predisposition to cancer due to pathogenic variants in the BRCA1 gene has autosomal dominant inheritance. This means that an individual with a pathogenic variant has a 50% chance of passing the condition on to his/her offspring. Most cases are inherited from a parent, but some cases may occur spontaneously (i.e., an individual with a pathogenic variant has parents who do not have it). Identification of a pathogenic variant allows for the recognition of at-risk relatives who can pursue testing for the familial variant.  Individuals with a single pathogenic BRCA1 variant may also be carriers of autosomal recessive Fanconi anemia. Fanconi anemia is characterized by bone marrow failure with variable additional anomalies, which often include short stature, abnormal skin pigmentation, abnormal thumbs, malformations of the skeletal and central nervous systems, and developmental delay (PMID: 8986277, 20417588). Risk of leukemia and early-onset solid tumors is significantly elevated with this disorder (PMID: 12393424, 12393516, 20507306). For there to be a risk of Fanconi anemia in offspring, both the patient and their partner would each have to carry a pathogenic variant in BRCA1; in this case, the risk to have an affected child is 25%.   Management:   Male breast cancer -Breast self-exam training and education starting at 35 -Annual  clinical breast exam starting at 35  Prostate cancer - Consider prostate cancer screening starting at 40   Christopher Burns reports he has already started this.   Pancreatic cancer -For individuals with a pathogenic/likely pathogenic variant in one of the pancreatic cancer susceptibility genes: -Consider pancreatic cancer screening beginning at age 50 (or 10 years younger than earliest exocrine pancreatic cancer diagnosis in the family, whichever is earlier), for individuals with exocrine pancreatic cancer in 1 or more first or second degree relatives from the same side of (or presumed to be from same side of) family as the identified pathogenic/likely pathogenic variant -The panel does not currently recommend pancreatic cancer screening for carrier of mutations in genes other than STK11 and CDKN2A in the absence of a close family history of exocrine pancreatic cancer  Christopher Burns does not report family history of pancreatic cancer at this time.   Melanoma -There are no specific NCCN guidelines regarding melanoma screening for individuals with BRCA mutations.  -However, sun protection is recommended, and routine skin exams by a dermatologist can be considered.    Male breast cancer -clinical breast exams every 6-12 months beginning at 25 -age 25-29: annual breast MRI screening with contrast -age 30-75: annual mammogram with consideration of tomosynthesis and breast MRI screening with contrast -For women treated for breast cancer, screening of remaining breast tissue with annual mammography and breast MRI should continue (if they have not had bilateral mastectomy) -Consider option of risk-reducing mastectomy   Ovarian cancer -Recommend risk-reducing   salpingo-oopherectomy (RRSO) typically between the ages of 43-40 and upon completion of childbearing -For patients who do not elect RRSO, transvaginal ultrasound combined with serum CA-125 for ovarian cancer screening has not been shown to be  sufficiently sensitive or specific as to support a positive recommendation, but, although of uncertain benefit, may be considered at clinician's discretion starting at age 77-35 years    These guidelines are based on current NCCN guidelines (NCCN v.1.2022).  These guidelines are subject to change and continually updated and should be directly referenced for future medical management.     FAMILY MEMBERS: It is important that all of Christopher Burns relatives (both men and women) know of the presence of this gene mutation. Site-specific genetic testing can sort out who in the family is at risk and who is not.   Christopher Burns son has a 50% chance to have inherited this mutation. However, they are relatively young and this will not be of any consequence to them for several years. We do not test children because there is no risk to them until they are adults. We recommend they have genetic counseling and testing by the time they are in their early 20s.    PLAN:   1. These results will be made available to  Christopher Burns's PCP and Dr. Janese Banks . He would like these providers to follow him long-term for this indication and coordinate screening.   2. Christopher Burns plans to discuss these results with his family and will reach out to [place] if we can be of any assistance in coordinating genetic testing for any of his relatives.    SUPPORT AND RESOURCES: If Christopher Burns is interested in BRCA-specific information and support, there are two groups, Facing Our Risk (www.facingourrisk.com) and Bright Pink (www.brightpink.org) which some people have found useful. They provide opportunities to speak with other individuals from high-risk families. To locate genetic counselors in other cities, visit the website of the [employer] (ArtistMovie.se) and Secretary/administrator for a Social worker by zip code.  We encouraged Christopher Burns to remain in contact with [place] on an annual basis so we can update his personal and family histories,  and let him know of advances in cancer genetics that may benefit the family. Our contact number was provided. Christopher Burns questions were answered to his satisfaction today, and he knows he is welcome to call anytime with additional questions.   Faith Rogue, MS, [facility] Genetic Counselor [email or web address] Phone: [phone]

## 2021-01-18 ENCOUNTER — Ambulatory Visit (INDEPENDENT_AMBULATORY_CARE_PROVIDER_SITE_OTHER): Payer: 59 | Admitting: Physician Assistant

## 2021-01-20 ENCOUNTER — Other Ambulatory Visit (INDEPENDENT_AMBULATORY_CARE_PROVIDER_SITE_OTHER): Payer: Self-pay | Admitting: Physician Assistant

## 2021-01-20 DIAGNOSIS — R7303 Prediabetes: Secondary | ICD-10-CM

## 2021-01-20 NOTE — Telephone Encounter (Signed)
Last seen Christopher Burns 

## 2021-02-22 ENCOUNTER — Other Ambulatory Visit (HOSPITAL_COMMUNITY): Payer: Self-pay | Admitting: Surgery

## 2021-02-22 ENCOUNTER — Other Ambulatory Visit: Payer: Self-pay | Admitting: Surgery

## 2021-02-22 DIAGNOSIS — Z6841 Body Mass Index (BMI) 40.0 and over, adult: Secondary | ICD-10-CM

## 2021-02-26 ENCOUNTER — Ambulatory Visit
Admission: RE | Admit: 2021-02-26 | Discharge: 2021-02-26 | Disposition: A | Discharge status: Home / Self Care | Payer: 59 | Source: Ambulatory Visit | Attending: Surgery | Admitting: Surgery

## 2021-02-26 ENCOUNTER — Encounter: Payer: Self-pay | Admitting: Oncology

## 2021-02-26 ENCOUNTER — Other Ambulatory Visit: Payer: Self-pay

## 2021-02-26 DIAGNOSIS — Z6841 Body Mass Index (BMI) 40.0 and over, adult: Secondary | ICD-10-CM | POA: Diagnosis present

## 2021-03-07 ENCOUNTER — Other Ambulatory Visit: Payer: Self-pay | Admitting: Internal Medicine

## 2021-03-07 ENCOUNTER — Other Ambulatory Visit (INDEPENDENT_AMBULATORY_CARE_PROVIDER_SITE_OTHER): Payer: Self-pay | Admitting: Vascular Surgery

## 2021-03-10 ENCOUNTER — Other Ambulatory Visit: Payer: Self-pay | Admitting: Internal Medicine

## 2021-03-18 ENCOUNTER — Other Ambulatory Visit: Payer: Self-pay

## 2021-03-18 ENCOUNTER — Encounter: Payer: 59 | Attending: Surgery | Admitting: Skilled Nursing Facility1

## 2021-03-18 ENCOUNTER — Encounter: Payer: Self-pay | Admitting: Skilled Nursing Facility1

## 2021-03-18 DIAGNOSIS — E662 Morbid (severe) obesity with alveolar hypoventilation: Secondary | ICD-10-CM | POA: Diagnosis present

## 2021-03-18 NOTE — Progress Notes (Signed)
Nutrition Assessment for Bariatric Surgery Medical Nutrition Therapy Appt Start Time: 2:30    End Time: 3:30  Patient was seen on 03/18/2021 for Pre-Operative Nutrition Assessment. Letter of approval faxed to [facility] bariatric surgery program coordinator on 03/18/2021.   Referral stated Supervised Weight Loss (SWL) visits needed: 0  Pt completed visits.   Pt has cleared nutrition requirements.    Planned surgery: sleeve gastrectomy  Pt expectation of surgery: to lose weight Pt expectation of dietitian: none identified     NUTRITION ASSESSMENT   Anthropometrics  Start weight at [facility]: 294.9 lbs (date: 03/18/2021)  Height: 70 in BMI: 42.31 kg/m2     Clinical  Medical hx: sleep apnea, hypercholesterolemia, blood clots, predaibetes, CKD, GERD Medications: Eliquis, Montelukast, simvastatin, Nexium, multivitamin, vitamin d, zinc, b12, fish oil, hydrochlorothiazide, vitamin C    Labs: cholesterol 216, triglycerdies 190, A1C 6.1, BUN 21, calcium 8.4, phosphorus 4.8 Notable signs/symptoms: states he does have a tick Any previous deficiencies? 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  Pt states he has not eaten rice in the last year.   24-Hr Dietary Recall First Meal: pineapple + cottage cheese Snack:  Second Meal: eaten out + dessert Snack: peanuts  Third Meal: steak or chicken or pork chop in coconut oil + aspargus or greens Snack:  Beverages: espresso shot, coffee + lactaid + protein shake, energy drink, water   Estimated Energy Needs Calories: 2000   NUTRITION DIAGNOSIS  Overweight/obesity ([place]-3.3) related to past poor dietary habits and physical inactivity as evidenced by patient w/ planned sleeve  gastrectomy surgery following dietary guidelines for continued weight loss.    NUTRITION INTERVENTION  Nutrition counseling (C-1) and education (E-2) to facilitate bariatric surgery goals.  Educated pt on micronutrient deficiencies post surgery and strategies to mitigate that risk   Pre-Op Goals Reviewed with the Patient Track food and beverage intake (pen and paper, MyFitness Pal, Baritastic app, etc.) Make healthy food choices while monitoring portion sizes Consume 3 meals per day or try to eat every 3-5 hours Avoid concentrated sugars and fried foods Keep sugar & fat in the single digits per serving on food labels Practice CHEWING your food (aim for applesauce consistency) Practice not drinking 15 minutes before, during, and 30 minutes after each meal and snack Avoid all carbonated beverages (ex: soda, sparkling beverages)  Limit caffeinated beverages (ex: coffee, tea, energy drinks) Avoid all sugar-sweetened beverages (ex: regular soda, sports drinks)  Avoid alcohol  Aim for 64-100 ounces of FLUID daily (with at least half of fluid intake being plain water)  Aim for at least 60-80 grams of PROTEIN daily Look for a liquid protein source that contains ?15 g protein and ?5 g carbohydrate (ex: shakes, drinks, shots) Make a list of non-food related activities Physical activity is an important part of a healthy lifestyle so keep it moving! The goal is to reach 150 minutes of exercise per week, including cardiovascular and weight baring activity.  *Goals that are bolded indicate the pt would like to start working towards these  Handouts Provided Include  Bariatric Surgery handouts (Nutrition Visits, Pre-Op Goals, Protein Shakes, Vitamins & Minerals)  Learning Style & Readiness for Change Teaching method utilized: Visual & Auditory  Demonstrated degree of understanding via: Teach Back  Readiness Level: action Barriers to learning/adherence to lifestyle change: none identified       MONITORING & EVALUATION Dietary intake, weekly physical activity, body weight, and pre-op goals reached at next nutrition visit.    Next Steps  Patient is to follow up at [facility] for Pre-Op Class >2 weeks before surgery for further nutrition education.  Pt has completed visits. No further supervised visits required/recomended

## 2021-03-31 ENCOUNTER — Ambulatory Visit (INDEPENDENT_AMBULATORY_CARE_PROVIDER_SITE_OTHER): Payer: 59 | Admitting: Psychology

## 2021-04-06 ENCOUNTER — Encounter (INDEPENDENT_AMBULATORY_CARE_PROVIDER_SITE_OTHER): Payer: Self-pay | Admitting: Vascular Surgery

## 2021-04-06 ENCOUNTER — Ambulatory Visit (INDEPENDENT_AMBULATORY_CARE_PROVIDER_SITE_OTHER): Payer: 59 | Admitting: Vascular Surgery

## 2021-04-06 ENCOUNTER — Other Ambulatory Visit: Payer: Self-pay

## 2021-04-06 VITALS — BP 129/86 | HR 75 | Ht 71.0 in | Wt 304.0 lb

## 2021-04-06 DIAGNOSIS — R7303 Prediabetes: Secondary | ICD-10-CM

## 2021-04-06 DIAGNOSIS — D751 Secondary polycythemia: Secondary | ICD-10-CM

## 2021-04-06 DIAGNOSIS — I10 Essential (primary) hypertension: Secondary | ICD-10-CM

## 2021-04-06 DIAGNOSIS — I2602 Saddle embolus of pulmonary artery with acute cor pulmonale: Secondary | ICD-10-CM

## 2021-04-06 NOTE — Progress Notes (Signed)
MRN : [id number]  Christopher Burns is a 50 y.o. (Feb 27, 1971) male who presents with chief complaint of  Chief Complaint  Patient presents with   Follow-up    5 Mo no studies   .  History of Present Illness: Patient returns today in follow up of his pulmonary embolus.  Almost 6 months ago, he underwent pulmonary thrombectomy for saddle pulmonary embolus with cor pulmonale.  He felt better almost immediately after the procedure and he has been at 100% cardiopulmonary function since shortly thereafter.  He feels great today.  He is in the process of a weight loss evaluation.  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  He has a follow-up appointment next month with a hematologist about potentially coming off of full anticoagulation and going to aspirin therapy which is reasonable to me.  He has a small amount of leg swelling intermittently but this is not all that bothersome.  He does have some compression socks and wears them intermittently.  Current Outpatient Medications  Medication Sig Dispense Refill   cetirizine (ZYRTEC) 10 MG tablet Take 10 mg by mouth daily.     cholecalciferol (VITAMIN D) 25 MCG (1000 UNIT) tablet Take 1,000 Units by mouth daily.     ELIQUIS 5 MG TABS tablet TAKE 1 TABLET BY MOUTH TWICE A DAY 60 tablet 3   EPINEPHrine 0.3 mg/0.3 mL IJ SOAJ injection Inject into the muscle as directed.     esomeprazole (NEXIUM) 40 MG capsule Take 40 mg by mouth daily.      lisinopril-hydrochlorothiazide (ZESTORETIC) 20-12.5 MG tablet Take 1 tablet by mouth 2 (two) times daily.     metFORMIN (GLUCOPHAGE) 500 MG tablet Take 1 tablet (500 mg total) by mouth daily with breakfast. 30 tablet 0   montelukast (SINGULAIR) 10 MG tablet Take 10 mg by mouth at bedtime.   11   Multiple Vitamins-Minerals (MULTIVITAMIN WITH MINERALS) tablet Take 1 tablet by mouth daily.     omega-3 acid ethyl esters (LOVAZA) 1 G capsule Take 1 g by  mouth daily.     simvastatin (ZOCOR) 10 MG tablet TAKE 1 TABLET BY MOUTH EVERYDAY AT BEDTIME 30 tablet 3   vitamin B-12 (CYANOCOBALAMIN) 100 MCG tablet Take 100 mcg by mouth daily.     vitamin C (ASCORBIC ACID) 500 MG tablet Take 500 mg by mouth daily.     zinc gluconate 50 MG tablet Take 50 mg by mouth daily.     albuterol (VENTOLIN HFA) 108 (90 Base) MCG/ACT inhaler Inhale 2 puffs into the lungs every 4 (four) hours as needed.     No current facility-administered medications for this visit.    Past Medical History:  Diagnosis Date   Asthma    Chronic kidney disease    Complication of anesthesia    HICCUPS FOR 1 WEEK AFTER BICEP SURGERY   Dizziness    Family history of BRCA1 gene positive    Family history of breast cancer    Family history of ovarian cancer    Family history of prostate cancer    GERD (gastroesophageal reflux disease)    HA (headache)    Hemorrhoid    High blood pressure    High cholesterol    Hx of blood clots    Hypercholesteremia    Polycythemia    Sleep apnea    NO CPAP    Past Surgical History:  Procedure Laterality Date  bisep Left 2007   COLONOSCOPY WITH PROPOFOL N/A 03/01/2019   Procedure: COLONOSCOPY WITH PROPOFOL;  Surgeon: Jonathon Bellows, MD;  Location: [facility] ENDOSCOPY;  Service: Gastroenterology;  Laterality: N/A;   KNEE SURGERY Right    LASIK     PULMONARY THROMBECTOMY Bilateral 10/23/2020   Procedure: PULMONARY THROMBECTOMY;  Surgeon: Algernon Huxley, MD;  Location: [facility];  Service: Cardiovascular;  Laterality: Bilateral;   TONSILLECTOMY     UMBILICAL HERNIA REPAIR N/A 07/29/2015   Procedure: HERNIA REPAIR UMBILICAL ADULT;  Surgeon: Christene Lye, MD;  Location: [facility] ORS;  Service: General;  Laterality: N/A;   VASOTOMY       Social History   Tobacco Use   Smoking status: Never   Smokeless tobacco: Never  Vaping Use   Vaping Use: Never used  Substance Use Topics   Alcohol use: Not Currently   Drug use: No       Family History  Problem Relation Age of Onset   High Cholesterol Mother    Breast cancer Mother 18   Diabetes Father    High blood pressure Father    High Cholesterol Father    Prostate cancer Paternal Grandfather    Cancer Maternal Grandfather        possible stomach cancer   Cancer Maternal Aunt        possible pancreatic cancer   Esophageal cancer Maternal Uncle    Breast cancer Cousin    Ovarian cancer Cousin      Allergies  Allergen Reactions   Other Itching and Rash    Pt was tested and positive for feline, canine and cockroaches. Pt has rash, itching, watery eyes. And is on allergy injections since 2017     REVIEW OF SYSTEMS (Negative unless checked)  Constitutional: _0 Weight loss  _1 Fever  _2 Chills Cardiac: _3 Chest pain   _4 Chest pressure   _5 Palpitations   _6 Shortness of breath when laying flat   _7 Shortness of breath at rest   _8 Shortness of breath with exertion. Vascular:  _9 Pain in legs with walking   _10 Pain in legs at rest   _11 Pain in legs when laying flat   _12 Claudication   _13 Pain in feet when walking  _14 Pain in feet at rest  _15 Pain in feet when laying flat   _16 History of DVT   _17 Phlebitis   _18 Swelling in legs   _19 Varicose veins   _20 Non-healing ulcers Pulmonary:   _21 Uses home oxygen   _22 Productive cough   _23 Hemoptysis   _24 Wheeze  _25 COPD   _26 Asthma Neurologic:  _27 Dizziness  _28 Blackouts   _29 Seizures   _30 History of stroke   _31 History of TIA  _32 Aphasia   _33 Temporary blindness   _34 Dysphagia   _35 Weakness or numbness in arms   _36 Weakness or numbness in legs Musculoskeletal:  _37 Arthritis   _38 Joint swelling   _39 Joint pain   _40 Low back pain Hematologic:  _41 Easy bruising  _42 Easy bleeding   _43 Hypercoagulable state   _44 Anemic   Gastrointestinal:  _45 Blood in stool   _46 Vomiting blood  _47 Gastroesophageal reflux/heartburn   _48 Abdominal pain Genitourinary:  _49 Chronic kidney disease   _50 Difficult urination  _51 Frequent urination  _52 Burning with urination   _53 Hematuria Skin:   _54 Rashes   _55 Ulcers   _56 Wounds Psychological:  _57 History of anxiety   _58  History of major depression.  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 Ear/Nose/Throat: Hearing grossly intact, nares w/o erythema or drainage Eyes: Conjunctiva clear. Sclera non-icteric Neck: Supple.  Trachea midline Pulmonary:  Good air movement, no use of accessory muscles.  Cardiac: RRR, no JVD Vascular:  Vessel Right Left  Radial Palpable Palpable           Musculoskeletal: M/S 5/5 throughout.  No deformity or atrophy.  Trace right lower extremity edema. Neurologic: Sensation grossly intact in extremities.  Symmetrical.  Speech is fluent.  Psychiatric: Judgment intact, Mood & affect appropriate for pt's clinical situation. Dermatologic: No rashes or ulcers noted.  No cellulitis or open wounds.      Labs No results found for this or any previous visit (from the past 2160 hour(s)).  Radiology No results found.  Assessment/Plan Polycythemia Was already seeing hematology for this.   Hypertension blood pressure control important in reducing the progression of atherosclerotic disease. On appropriate oral medications.  Pulmonary embolism [facility]) Patient is doing very well.  Pulmonary thrombectomy was a marked improvement for him and he has tolerated anticoagulation now for roughly 6 months.  He is following up with hematology next month.  At this point, discontinuing anticoagulation and going to aspirin therapy is a reasonable option and 1 that he may need to do for his job purposes.  If he were to have recurrent thromboembolic events, full anticoagulation would then need to be restarted.  He understands he has had a slightly increased risk of a general population for recurrent thromboembolic events after a previous event, but traditional  therapy of switching to aspirin in 6 months is reasonable from my standpoint.  I will see him back as needed at this point.    Leotis Pain, MD  04/06/2021 12:19 PM    This note was created with Dragon medical transcription system.  Any errors from dictation are purely unintentional

## 2021-04-06 NOTE — Assessment & Plan Note (Signed)
Patient is doing very well.  Pulmonary thrombectomy was a marked improvement for him and he has tolerated anticoagulation now for roughly 6 months.  He is following up with hematology next month.  At this point, discontinuing anticoagulation and going to aspirin therapy is a reasonable option and 1 that he may need to do for his job purposes.  If he were to have recurrent thromboembolic events, full anticoagulation would then need to be restarted.  He understands he has had a slightly increased risk of a general population for recurrent thromboembolic events after a previous event, but traditional therapy of switching to aspirin in 6 months is reasonable from my standpoint.  I will see him back as needed at this point.

## 2021-04-14 ENCOUNTER — Ambulatory Visit: Payer: 59 | Admitting: Psychology

## 2021-04-24 IMAGING — CR DG CHEST 2V
2 series · 2 of 2 positions shown · non-contrast
Comparison: 01/04/2011.

CLINICAL DATA: Chest tightness.  Shortness of breath.

EXAM:
CHEST - 2 VIEW

[chest pa]
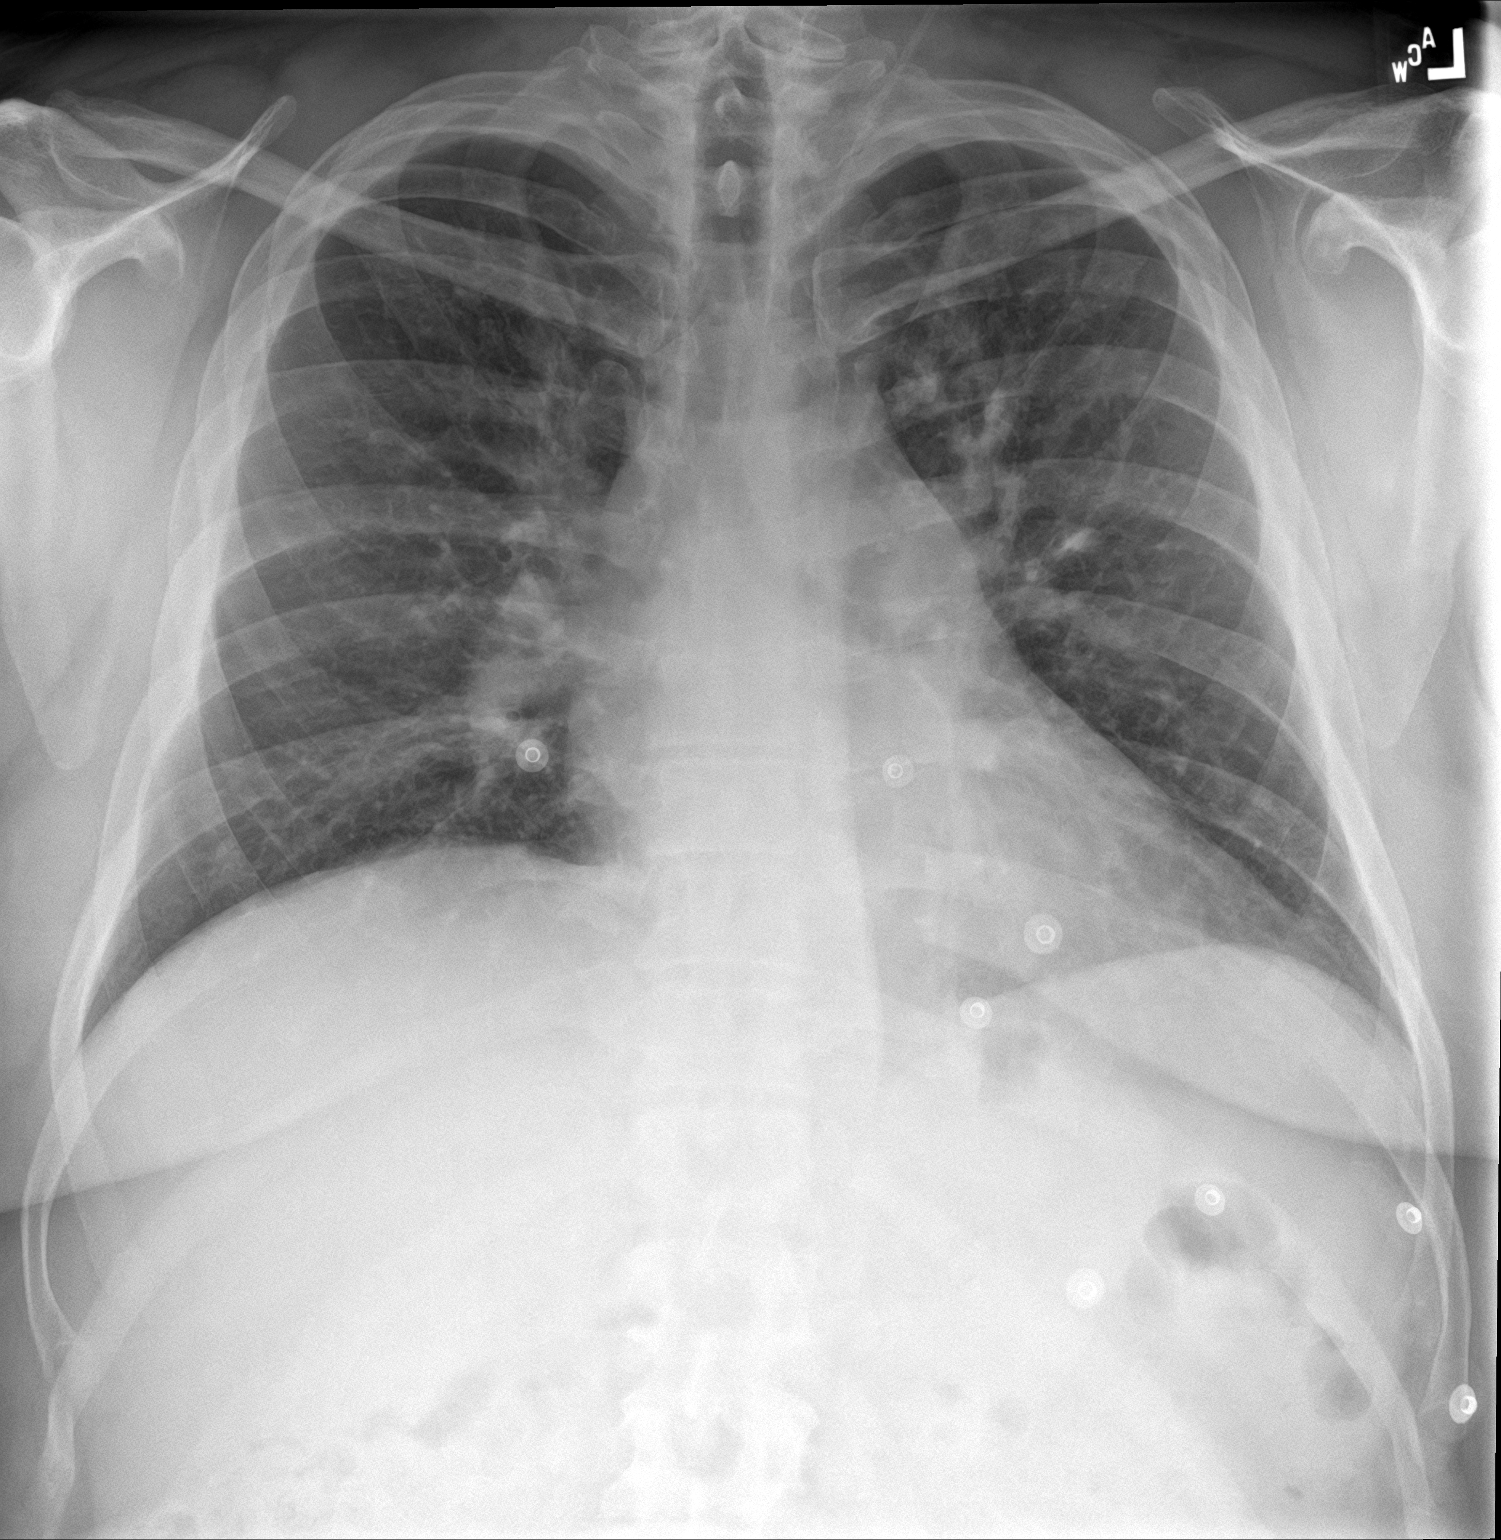

[chest lat]
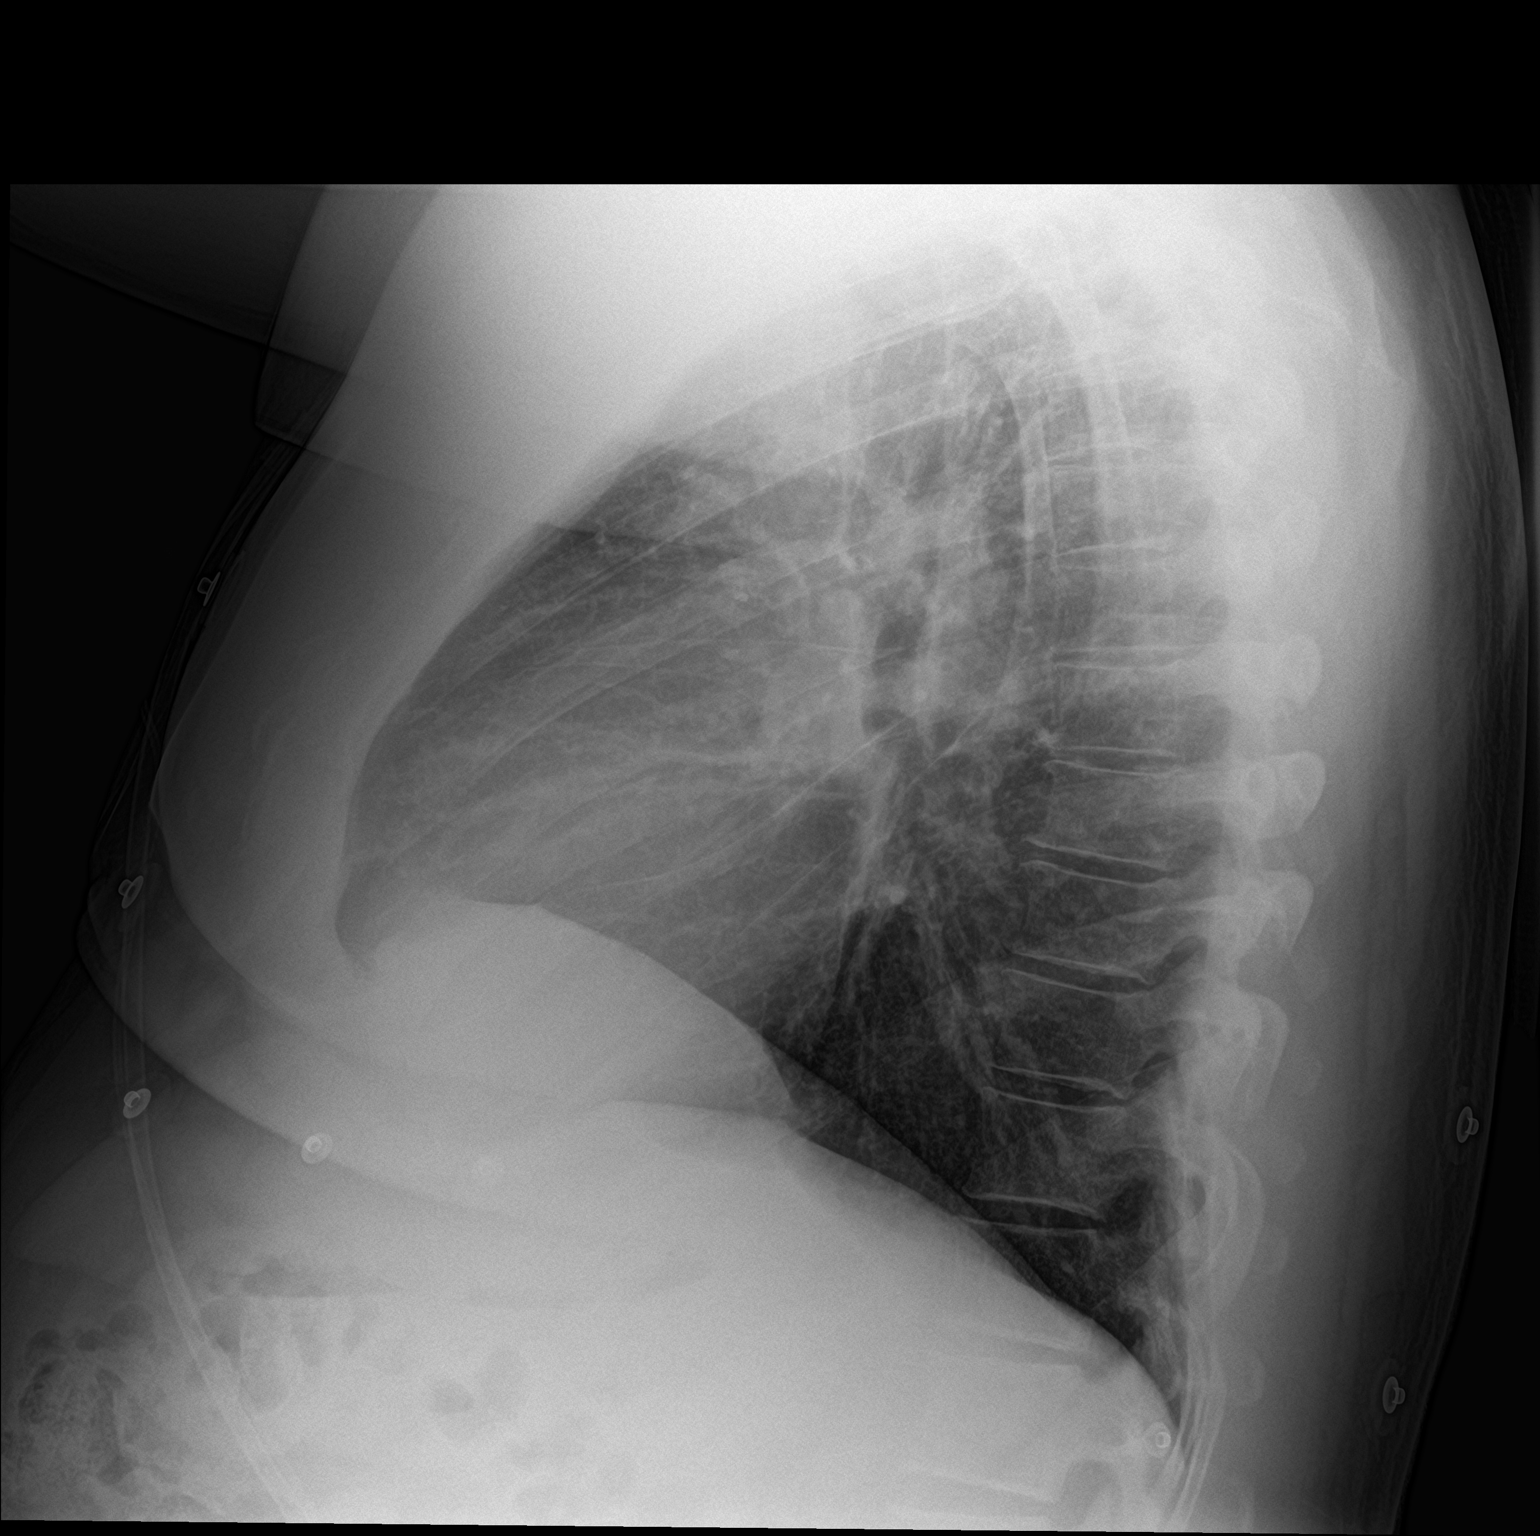

[2 of 2 positions shown; findings below may reference images not displayed]

FINDINGS: Borderline cardiomegaly and pulmonary venous congestion. Low lung
volumes. Mild infiltrate left upper lung cannot be excluded. No
pleural effusion or pneumothorax. No acute bony abnormality.
IMPRESSION: 1.  Borderline cardiomegaly and pulmonary venous congestion.

2. Low lung volumes. Mild infiltrate left upper lung cannot be
excluded.

## 2021-04-26 ENCOUNTER — Encounter: Payer: 59 | Attending: Surgery | Admitting: Skilled Nursing Facility1

## 2021-04-26 ENCOUNTER — Telehealth (INDEPENDENT_AMBULATORY_CARE_PROVIDER_SITE_OTHER): Payer: Self-pay | Admitting: Vascular Surgery

## 2021-04-26 ENCOUNTER — Ambulatory Visit: Payer: 59 | Admitting: Internal Medicine

## 2021-04-26 ENCOUNTER — Other Ambulatory Visit: Payer: Self-pay

## 2021-04-26 DIAGNOSIS — E669 Obesity, unspecified: Secondary | ICD-10-CM | POA: Diagnosis present

## 2021-04-26 NOTE — Telephone Encounter (Signed)
The NP has been made aware and is going to prepare a paper to send over.

## 2021-04-26 NOTE — Progress Notes (Signed)
Pt canceled his appointment.  

## 2021-04-26 NOTE — Progress Notes (Signed)
Pre-Operative Nutrition Class:    Patient was seen on 04/26/2021 for Pre-Operative Bariatric Surgery Education at the [facility].    Surgery date:  Surgery type: sleeve Start weight at [facility]: 294.9 pounds Weight today: 292.9 pounds  Samples given per MNT protocol. Patient educated on appropriate usage: Bariatric Advantage Multivitamin Lot # [id number] Exp: 08/23   Procare Calcium  Lot # [id number] Exp: 03/23   Bariatric Advantage protein powder Lot # [id number] Exp: 10/23  The following the learning objectives were met by the patient during this course: Identify Pre-Op Dietary Goals and will begin 2 weeks pre-operatively Identify appropriate sources of fluids and proteins  State protein recommendations and appropriate sources pre and post-operatively Identify Post-Operative Dietary Goals and will follow for 2 weeks post-operatively Identify appropriate multivitamin and calcium sources Describe the need for physical activity post-operatively and will follow MD recommendations State when to call healthcare provider regarding medication questions or post-operative complications When having a diagnosis of diabetes understanding hypoglycemia symptoms and the inclusion of 1 complex carbohydrate per meal  Handouts given during class include: Pre-Op Bariatric Surgery Diet Handout Protein Shake Handout Post-Op Bariatric Surgery Nutrition Handout BELT Program Information Flyer Support Group Information Flyer [facility] Bariatric Supplements Price List  Follow-Up Plan: Patient will follow-up at [facility] 2 weeks post operatively for diet advancement per MD.

## 2021-04-28 ENCOUNTER — Ambulatory Visit
Admission: RE | Admit: 2021-04-28 | Discharge: 2021-04-28 | Disposition: A | Discharge status: Home / Self Care | Payer: 59 | Source: Ambulatory Visit | Attending: Oncology | Admitting: Oncology

## 2021-04-28 ENCOUNTER — Other Ambulatory Visit: Payer: Self-pay

## 2021-04-28 DIAGNOSIS — G4730 Sleep apnea, unspecified: Secondary | ICD-10-CM | POA: Diagnosis not present

## 2021-04-28 DIAGNOSIS — I2609 Other pulmonary embolism with acute cor pulmonale: Secondary | ICD-10-CM | POA: Diagnosis not present

## 2021-04-28 DIAGNOSIS — I129 Hypertensive chronic kidney disease with stage 1 through stage 4 chronic kidney disease, or unspecified chronic kidney disease: Secondary | ICD-10-CM | POA: Insufficient documentation

## 2021-04-28 DIAGNOSIS — N189 Chronic kidney disease, unspecified: Secondary | ICD-10-CM | POA: Diagnosis not present

## 2021-04-28 DIAGNOSIS — I2782 Chronic pulmonary embolism: Secondary | ICD-10-CM

## 2021-04-28 LAB — ECHOCARDIOGRAM COMPLETE
AR max vel: 2.75 cm2
AV Area VTI: 2.89 cm2
AV Area mean vel: 3.05 cm2
AV Mean grad: 4 mmHg
AV Peak grad: 7.8 mmHg
Ao pk vel: 1.4 m/s
Area-P 1/2: 4.36 cm2
MV VTI: 3.07 cm2
S' Lateral: 3.46 cm

## 2021-04-28 NOTE — Progress Notes (Signed)
*  PRELIMINARY RESULTS* Echocardiogram 2D Echocardiogram has been performed.  Christopher Burns Ocia Simek 04/28/2021, 10:33 AM

## 2021-05-03 ENCOUNTER — Other Ambulatory Visit: Payer: Self-pay

## 2021-05-03 ENCOUNTER — Inpatient Hospital Stay (HOSPITAL_BASED_OUTPATIENT_CLINIC_OR_DEPARTMENT_OTHER): Payer: 59 | Admitting: Oncology

## 2021-05-03 ENCOUNTER — Inpatient Hospital Stay: Payer: 59 | Attending: Oncology

## 2021-05-03 ENCOUNTER — Encounter: Payer: Self-pay | Admitting: Oncology

## 2021-05-03 ENCOUNTER — Other Ambulatory Visit: Payer: Self-pay | Admitting: *Deleted

## 2021-05-03 VITALS — BP 140/87 | HR 79 | Temp 97.9°F | Resp 20 | Wt 296.3 lb

## 2021-05-03 DIAGNOSIS — Z86711 Personal history of pulmonary embolism: Secondary | ICD-10-CM

## 2021-05-03 DIAGNOSIS — I2699 Other pulmonary embolism without acute cor pulmonale: Secondary | ICD-10-CM

## 2021-05-03 DIAGNOSIS — I5189 Other ill-defined heart diseases: Secondary | ICD-10-CM

## 2021-05-03 DIAGNOSIS — D751 Secondary polycythemia: Secondary | ICD-10-CM

## 2021-05-03 LAB — COMPREHENSIVE METABOLIC PANEL
ALT: 46 U/L — ABNORMAL HIGH (ref 0–44)
AST: 31 U/L (ref 15–41)
Albumin: 4.1 g/dL (ref 3.5–5.0)
Alkaline Phosphatase: 42 U/L (ref 38–126)
Anion gap: 8 (ref 5–15)
BUN: 14 mg/dL (ref 6–20)
CO2: 26 mmol/L (ref 22–32)
Calcium: 9.1 mg/dL (ref 8.9–10.3)
Chloride: 103 mmol/L (ref 98–111)
Creatinine, Ser: 1.03 mg/dL (ref 0.61–1.24)
GFR, Estimated: >60 mL/min (ref >60)
Glucose, Bld: 110 mg/dL — ABNORMAL HIGH (ref 70–99)
Potassium: 4.1 mmol/L (ref 3.5–5.1)
Sodium: 137 mmol/L (ref 135–145)
Total Bilirubin: 0.5 mg/dL (ref 0.3–1.2)
Total Protein: 7.2 g/dL (ref 6.5–8.1)

## 2021-05-03 LAB — CBC WITH DIFFERENTIAL/PLATELET
Abs Immature Granulocytes: 0.02 10*3/uL (ref 0.00–0.07)
Basophils Absolute: 0 10*3/uL (ref 0.0–0.1)
Basophils Relative: 0 %
Eosinophils Absolute: 0.1 10*3/uL (ref 0.0–0.5)
Eosinophils Relative: 2 %
HCT: 44.2 % (ref 39.0–52.0)
Hemoglobin: 14.6 g/dL (ref 13.0–17.0)
Immature Granulocytes: 0 %
Lymphocytes Relative: 32 %
Lymphs Abs: 2.2 10*3/uL (ref 0.7–4.0)
MCH: 26.4 pg (ref 26.0–34.0)
MCHC: 33 g/dL (ref 30.0–36.0)
MCV: 80.1 fL (ref 80.0–100.0)
Monocytes Absolute: 0.6 10*3/uL (ref 0.1–1.0)
Monocytes Relative: 9 %
Neutro Abs: 3.8 10*3/uL (ref 1.7–7.7)
Neutrophils Relative %: 57 %
Platelets: 241 10*3/uL (ref 150–400)
RBC: 5.52 MIL/uL (ref 4.22–5.81)
RDW: 13.3 % (ref 11.5–15.5)
WBC: 6.8 10*3/uL (ref 4.0–10.5)
nRBC: 0 % (ref 0.0–0.2)

## 2021-05-03 NOTE — Progress Notes (Signed)
Hematology/Oncology Consult note [facility]  [phone] [phone]  Patient Care Team: Cletis Athens, MD as PCP - General (Internal Medicine) Cletis Athens, MD (Internal Medicine) Christene Lye, MD (General Surgery) Sindy Guadeloupe, MD as Consulting Physician (Hematology and Oncology)   Name of the patient: Christopher Burns  [id number]  11-29-1970   Date of visit: 05/03/21  Diagnosis-history of unprovoked PE in March 2022  Chief complaint/ Reason for visit-discuss anticoagulation recommendations  Heme/Onc history:  Patient is a 50 year old male with past medical history significant for testosterone induced secondary polycythemia and was last seen by me in May 2021.  He has required a few phlebotomy sessions but following that he stopped taking testosterone and his hemoglobin normalized.  JAK2 mutation testing was negative and EPO levels were normal.   More recently patient Had symptoms of exertional shortness of breath and chest palpitations for which she was admitted to the hospital and was found to have right popliteal vein DVT as well as bilateral PE with right heart strain requiring thrombectomy in March 2022.  He was discharged on Eliquis.   Results of hypercoagulable work-up including protein C protein/Antithrombin III levels, factor V Leiden, prothrombin gene mutation and antiphospholipid antibody panel were all negative  Interval history-patient reports doing well overall.  Denies any specific complaints at this time.  He is hoping to go through gastric bypass surgery November 2022.  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   HENT:  Negative for congestion, ear discharge and nosebleeds.   Eyes:  Negative for blurred vision.  Respiratory:  Negative for cough, hemoptysis, sputum production, shortness of breath and wheezing.   Cardiovascular:  Negative for chest pain, palpitations, orthopnea and claudication.  Gastrointestinal:  Negative for abdominal pain, blood in stool, constipation, diarrhea, heartburn, melena, nausea and vomiting.  Genitourinary:  Negative for dysuria, flank pain, frequency, hematuria and urgency.  Musculoskeletal:  Negative for back pain, joint pain and myalgias.  Skin:  Negative for rash.  Neurological:  Negative for dizziness, tingling, focal weakness, seizures, weakness and headaches.  Endo/Heme/Allergies:  Does not bruise/bleed easily.  Psychiatric/Behavioral:  Negative for depression and suicidal ideas. The patient does not have insomnia.      Allergies  Allergen Reactions   Other Itching and Rash    Pt was tested and positive for feline, canine and cockroaches. Pt has rash, itching, watery eyes. And is on allergy injections since 2017     Past Medical History:  Diagnosis Date   Asthma    Chronic kidney disease    Complication of anesthesia    HICCUPS FOR 1 WEEK AFTER BICEP SURGERY   Dizziness    Family history of BRCA1 gene positive    Family history of breast cancer    Family history of ovarian cancer    Family history of prostate cancer    GERD (gastroesophageal reflux disease)    HA (headache)    Hemorrhoid    High blood pressure    High cholesterol    Hx of blood clots    Hypercholesteremia    Polycythemia    Sleep apnea    NO CPAP     Past Surgical  History:  Procedure Laterality Date   bisep Left 2007   COLONOSCOPY WITH PROPOFOL N/A 03/01/2019   Procedure: COLONOSCOPY WITH PROPOFOL;  Surgeon: Jonathon Bellows, MD;  Location: [facility] ENDOSCOPY;  Service: Gastroenterology;  Laterality: N/A;   KNEE SURGERY Right    LASIK     PULMONARY THROMBECTOMY Bilateral 10/23/2020    Procedure: PULMONARY THROMBECTOMY;  Surgeon: Algernon Huxley, MD;  Location: [place] [facility];  Service: Cardiovascular;  Laterality: Bilateral;   TONSILLECTOMY     UMBILICAL HERNIA REPAIR N/A 07/29/2015   Procedure: HERNIA REPAIR UMBILICAL ADULT;  Surgeon: Christene Lye, MD;  Location: [facility] ORS;  Service: General;  Laterality: N/A;   VASOTOMY      Social History   Socioeconomic History   Marital status: Married    Spouse name: [name]   Number of children: 1   Years of education: 13   Highest education level: Not on file  Occupational History   Occupation: work full time    Comment: [employer]  Tobacco Use   Smoking status: Never   Smokeless tobacco: Never  Vaping Use   Vaping Use: Never used  Substance and Sexual Activity   Alcohol use: Not Currently   Drug use: No   Sexual activity: Yes  Other Topics Concern   Not on file  Social History Narrative   Patient lives at home  with his wife [name]).  Patient works full time for [employer].   Education some college.   Right handed.   Caffeine one shot of colombian  coffee.   Social Determinants of Health   Financial Resource Strain: Not on file  Food Insecurity: Not on file  Transportation Needs: Not on file  Physical Activity: Not on file  Stress: Not on file  Social Connections: Not on file  Intimate Partner Violence: Not on file    Family History  Problem Relation Age of Onset   High Cholesterol Mother    Breast cancer Mother 7   Diabetes Father    High blood pressure Father    High Cholesterol Father    Prostate cancer Paternal Grandfather    Cancer Maternal Grandfather        possible stomach cancer   Cancer Maternal Aunt        possible pancreatic cancer   Esophageal cancer Maternal Uncle    Breast cancer Cousin    Ovarian cancer Cousin      Current Outpatient Medications:    albuterol (VENTOLIN HFA) 108 (90 Base) MCG/ACT inhaler, Inhale 2 puffs into the lungs every 4  (four) hours as needed., Disp: , Rfl:    cetirizine (ZYRTEC) 10 MG tablet, Take 10 mg by mouth daily., Disp: , Rfl:    cholecalciferol (VITAMIN D) 25 MCG (1000 UNIT) tablet, Take 1,000 Units by mouth daily., Disp: , Rfl:    ELIQUIS 5 MG TABS tablet, TAKE 1 TABLET BY MOUTH TWICE A DAY, Disp: 60 tablet, Rfl: 3   EPINEPHrine 0.3 mg/0.3 mL IJ SOAJ injection, Inject into the muscle as directed., Disp: , Rfl:    esomeprazole (NEXIUM) 40 MG capsule, Take 40 mg by mouth daily. , Disp: , Rfl:    lisinopril-hydrochlorothiazide (ZESTORETIC) 20-12.5 MG tablet, Take 1 tablet by mouth 2 (two) times daily., Disp: , Rfl:    montelukast (SINGULAIR) 10 MG tablet, Take 10 mg by mouth at bedtime. , Disp: , Rfl: 11   Multiple Vitamins-Minerals (MULTIVITAMIN WITH MINERALS) tablet, Take 1 tablet by mouth daily., Disp: ,  Rfl:    omega-3 acid ethyl esters (LOVAZA) 1 G capsule, Take 1 g by mouth daily., Disp: , Rfl:    simvastatin (ZOCOR) 10 MG tablet, TAKE 1 TABLET BY MOUTH EVERYDAY AT BEDTIME, Disp: 30 tablet, Rfl: 3   vitamin B-12 (CYANOCOBALAMIN) 100 MCG tablet, Take 100 mcg by mouth daily., Disp: , Rfl:    vitamin C (ASCORBIC ACID) 500 MG tablet, Take 500 mg by mouth daily., Disp: , Rfl:    zinc gluconate 50 MG tablet, Take 50 mg by mouth daily., Disp: , Rfl:    metFORMIN (GLUCOPHAGE) 500 MG tablet, Take 1 tablet (500 mg total) by mouth daily with breakfast. (Patient not taking: Reported on 05/03/2021), Disp: 30 tablet, Rfl: 0  Physical exam:  Vitals:   05/03/21 1349  BP: 140/87  Pulse: 79  Resp: 20  Temp: 97.9 F (36.6 C)  SpO2: 98%  Weight: 296 lb 4.8 oz (134.4 kg)   Physical Exam Constitutional:      General: He is not in acute distress. Cardiovascular:     Rate and Rhythm: Normal rate and regular rhythm.     Heart sounds: Normal heart sounds.  Pulmonary:     Effort: Pulmonary effort is normal.     Breath sounds: Normal breath sounds.  Skin:    General: Skin is warm and dry.  Neurological:      Mental Status: He is alert and oriented to person, place, and time.     CMP Latest Ref Rng & Units 05/03/2021  Glucose 70 - 99 mg/dL 110(H)  BUN 6 - 20 mg/dL 14  Creatinine 0.61 - 1.24 mg/dL 1.03  Sodium 135 - 145 mmol/L 137  Potassium 3.5 - 5.1 mmol/L 4.1  Chloride 98 - 111 mmol/L 103  CO2 22 - 32 mmol/L 26  Calcium 8.9 - 10.3 mg/dL 9.1  Total Protein 6.5 - 8.1 g/dL 7.2  Total Bilirubin 0.3 - 1.2 mg/dL 0.5  Alkaline Phos 38 - 126 U/L 42  AST 15 - 41 U/L 31  ALT 0 - 44 U/L 46(H)   CBC Latest Ref Rng & Units 05/03/2021  WBC 4.0 - 10.5 K/uL 6.8  Hemoglobin 13.0 - 17.0 g/dL 14.6  Hematocrit 39.0 - 52.0 % 44.2  Platelets 150 - 400 K/uL 241    No images are attached to the encounter.  ECHOCARDIOGRAM COMPLETE  Result Date: 04/28/2021    ECHOCARDIOGRAM REPORT   Patient Name:   KERRON SEDANO Date of Exam: 04/28/2021 Medical Rec #:  [id number]      Height:       71.0 in Accession #:    [id number]     Weight:       292.9 lb Date of Birth:  August 03, 1971     BSA:          2.480 m Patient Age:    5 years       BP:           129/86 mmHg Patient Gender: M              HR:           66 bpm. Exam Location:  [facility] Procedure: 2D Echo, Color Doppler, Cardiac Doppler and Strain Analysis Indications:     I26.09 Pulmonary Embolus  History:         Patient has prior history of Echocardiogram examinations, most                  recent 10/23/2020. CKD; Risk  Factors:Hypertension, Sleep Apnea                  and HCL.  Sonographer:     Charmayne Sheer Referring Phys:  [id number] Weston Anna Malka Bocek Diagnosing Phys: Kate Sable MD  Sonographer Comments: Global longitudinal strain was attempted. IMPRESSIONS  1. Left ventricular ejection fraction, by estimation, is 55 to 60%. The left ventricle has normal function. The left ventricle has no regional wall motion abnormalities. Left ventricular diastolic parameters were normal. The average left ventricular global longitudinal strain is -17.2 %. The global longitudinal strain is  normal.  2. Right ventricular systolic function is normal. The right ventricular size is normal.  3. The mitral valve is normal in structure. No evidence of mitral valve regurgitation.  4. The aortic valve is tricuspid. Aortic valve regurgitation is not visualized. FINDINGS  Left Ventricle: Left ventricular ejection fraction, by estimation, is 55 to 60%. The left ventricle has normal function. The left ventricle has no regional wall motion abnormalities. The average left ventricular global longitudinal strain is -17.2 %. The global longitudinal strain is normal. The left ventricular internal cavity size was normal in size. There is no left ventricular hypertrophy. Left ventricular diastolic parameters were normal. Right Ventricle: The right ventricular size is normal. No increase in right ventricular wall thickness. Right ventricular systolic function is normal. Left Atrium: Left atrial size was normal in size. Right Atrium: Right atrial size was normal in size. Pericardium: There is no evidence of pericardial effusion. Mitral Valve: The mitral valve is normal in structure. No evidence of mitral valve regurgitation. MV peak gradient, 2.3 mmHg. The mean mitral valve gradient is 1.0 mmHg. Tricuspid Valve: The tricuspid valve is normal in structure. Tricuspid valve regurgitation is not demonstrated. Aortic Valve: The aortic valve is tricuspid. Aortic valve regurgitation is not visualized. Aortic valve mean gradient measures 4.0 mmHg. Aortic valve peak gradient measures 7.8 mmHg. Aortic valve area, by VTI measures 2.89 cm. Pulmonic Valve: The pulmonic valve was normal in structure. Pulmonic valve regurgitation is not visualized. Aorta: The aortic root is normal in size and structure. Venous: The inferior vena cava was not well visualized. IAS/Shunts: No atrial level shunt detected by color flow Doppler.  LEFT VENTRICLE PLAX 2D LVIDd:         5.41 cm  Diastology LVIDs:         3.46 cm  LV e' medial:    10.00 cm/s LV PW:          1.08 cm  LV E/e' medial:  6.6 LV IVS:        0.70 cm  LV e' lateral:   7.83 cm/s LVOT diam:     2.30 cm  LV E/e' lateral: 8.5 LV SV:         73 LV SV Index:   29       2D Longitudinal Strain LVOT Area:     4.15 cm 2D Strain GLS Avg:     -17.2 %  RIGHT VENTRICLE RV Basal diam:  2.80 cm RV S prime:     12.80 cm/s LEFT ATRIUM             Index       RIGHT ATRIUM           Index LA diam:        3.80 cm 1.53 cm/m  RA Area:     13.00 cm LA Vol (A2C):   60.0 ml 24.20 ml/m RA Volume:   29.60  ml  11.94 ml/m LA Vol (A4C):   45.1 ml 18.19 ml/m LA Biplane Vol: 56.2 ml 22.66 ml/m  AORTIC VALVE                   PULMONIC VALVE AV Area (Vmax):    2.75 cm    PV Vmax:       1.22 m/s AV Area (Vmean):   3.05 cm    PV Vmean:      72.700 cm/s AV Area (VTI):     2.89 cm    PV VTI:        0.209 m AV Vmax:           140.00 cm/s PV Peak grad:  6.0 mmHg AV Vmean:          87.000 cm/s PV Mean grad:  2.0 mmHg AV VTI:            0.253 m AV Peak Grad:      7.8 mmHg AV Mean Grad:      4.0 mmHg LVOT Vmax:         92.70 cm/s LVOT Vmean:        63.900 cm/s LVOT VTI:          0.176 m LVOT/AV VTI ratio: 0.70  AORTA Ao Root diam: 3.50 cm MITRAL VALVE MV Area (PHT): 4.36 cm    SHUNTS MV Area VTI:   3.07 cm    Systemic VTI:  0.18 m MV Peak grad:  2.3 mmHg    Systemic Diam: 2.30 cm MV Mean grad:  1.0 mmHg MV Vmax:       0.76 m/s MV Vmean:      52.7 cm/s MV Decel Time: 174 msec MV E velocity: 66.40 cm/s MV A velocity: 52.70 cm/s MV E/A ratio:  1.26 Kate Sable MD Electronically signed by Kate Sable MD Signature Date/Time: 04/28/2021/11:15:17 AM    Final      Assessment and plan- Patient is a 50 y.o. male with prior history of secondary polycythemia from testosterone replacement therapy who is now off testosterone.  He had a history of popliteal vein thrombus and bilateral PE with right heart strain in March 2022.  He is currently on Eliquis and here to discuss further management  Patient had evidence of right heart  strain on his echocardiogram in March 2022.  Since then he underwent thrombectomy as well as completed 6 months of anticoagulation and repeat echocardiogram at this time reveals normal right ventricular function and no evidence of pulmonary hypertension.  Patient strongly desires to come off anticoagulation at this time as it is affecting the nature of his job.  We discussed that unprovoked bilateral PE does have a high risk of recurrence but since patient has completed 6 months of anticoagulation and strongly desires to come off anticoagulation we will stop his Eliquis at this time.  I would like the patient to take 100 mg of aspirin at this time which is better than placebo if he is not on any anticoagulation.One multicenter trial [facility]) randomly assigned 402 patients with a first episode of unprovoked VTE who were treated with 6 to 18 months of warfarin therapy to receive aspirin or placebo for an additional two years. Aspirin was associated with a 40 percent reduction in the rate of recurrent VTE (7 versus 11 percent per year; HR 0.58; 95% CI 0.36-0.93). Bleeding rates were similar between the groups (0.3 percent per year)   Educated patient about possible signs and symptoms of  DVT and PE.  No follow-up needed with me at this time but if he has any questions or concerns in the future he can reach out to me    Visit Diagnosis 1. History of pulmonary embolism      Dr. Randa Evens, MD, MPH [facility] [phone] 05/03/2021 4:28 PM

## 2021-05-25 NOTE — Progress Notes (Signed)
Sent message, via epic in basket, requesting orders in epic from surgeon.  

## 2021-05-26 ENCOUNTER — Other Ambulatory Visit (HOSPITAL_COMMUNITY): Payer: Self-pay

## 2021-05-27 NOTE — Progress Notes (Addendum)
Anesthesia Review:  PCP: DR Juel Burrow in [place]  Cardiologist : none  Pulm- LOV 04/26/21- DR Quita Skye- DR Festus Barren- LOV 04/06/21  Chest x-ray : 2v 10/22/20  10/23/20- Pulmonary thrombectomy  EKG :10/22/20  Echo : 04/28/21  Stress test: Cardiac Cath :  Activity level: cannot do a flgiht of stairs without difficulty  Sleep Study/ CPAP : ahs cpap to bring mask and tubing  Fasting Blood Sugar :      / Checks Blood Sugar -- times a day:   Blood Thinner/ Instructions /Last Dose: ASA / Instructions/ Last Dose :   81 mg Aspirin  Prediabetes-  Does not check glucose at home  Hgba1c-05/31/21-6.2  [employer] officer  Covid test on 06/11/21

## 2021-05-27 NOTE — Progress Notes (Signed)
DUE TO COVID-19 ONLY ONE VISITOR IS ALLOWED TO COME WITH YOU AND STAY IN THE WAITING ROOM ONLY DURING PRE OP AND PROCEDURE DAY OF SURGERY.  2 VISITOR  MAY VISIT WITH YOU AFTER SURGERY IN YOUR PRIVATE ROOM DURING VISITING HOURS ONLY!  YOU NEED TO HAVE A COVID 19 TEST ON__  06/11/2021 ____AME@_  @_from  8am-3pm _____, THIS TEST MUST BE DONE BEFORE SURGERY,  Covid test is done at [address] [place], [place] [address].  This is a drive thru.  No appt required. Please see map.                 Your procedure is scheduled on:   06/15/2021   Report to [facility] Main  Entrance   Report to admitting at    [phone]     Call this number if you have problems the morning of surgery [phone]    REMEMBER: NO  SOLID FOOD CANDY OR GUM AFTER MIDNIGHT. CLEAR LIQUIDS UNTIL   0530am        . NOTHING BY MOUTH EXCEPT CLEAR LIQUIDS UNTIL  0530am   . PLEASE FINISH ENSURE DRINK PER SURGEON ORDER  WHICH NEEDS TO BE COMPLETED AT      0530am .      CLEAR LIQUID DIET   Foods Allowed                                                                    Coffee and tea, regular and decaf                            Fruit ices (not with fruit pulp)                                      Iced Popsicles                                    Carbonated beverages, regular and diet                                    Cranberry, grape and apple juices Sports drinks like Gatorade Lightly seasoned clear broth or consume(fat free) Sugar, honey syrup ___________________________________________________________________      BRUSH YOUR TEETH MORNING OF SURGERY AND RINSE YOUR MOUTH OUT, NO CHEWING GUM CANDY OR MINTS.     Take these medicines the morning of surgery with A SIP OF WATER:     Inhalers as usual and bring, zyrtec  DO NOT TAKE ANY DIABETIC MEDICATIONS DAY OF YOUR SURGERY                               You may not have any metal on your body including hair pins and              piercings  Do not wear  jewelry, make-up, lotions, powders or perfumes, deodorant  Do not wear nail polish on your fingernails.  Do not shave  48 hours prior to surgery.              Men may shave face and neck.   Do not bring valuables to the hospital. [place].  Contacts, dentures or bridgework may not be worn into surgery.  Leave suitcase in the car. After surgery it may be brought to your room.     Patients discharged the day of surgery will not be allowed to drive home. IF YOU ARE HAVING SURGERY AND GOING HOME THE SAME DAY, YOU MUST HAVE AN ADULT TO DRIVE YOU HOME AND BE WITH YOU FOR 24 HOURS. YOU MAY GO HOME BY TAXI OR UBER OR ORTHERWISE, BUT AN ADULT MUST ACCOMPANY YOU HOME AND STAY WITH YOU FOR 24 HOURS.  Name and phone number of your driver:  Special Instructions: N/A              Please read over the following fact sheets you were given: _____________________________________________________________________  [facility] - Preparing for Surgery Before surgery, you can play an important role.  Because skin is not sterile, your skin needs to be as free of germs as possible.  You can reduce the number of germs on your skin by washing with CHG (chlorahexidine gluconate) soap before surgery.  CHG is an antiseptic cleaner which kills germs and bonds with the skin to continue killing germs even after washing. Please DO NOT use if you have an allergy to CHG or antibacterial soaps.  If your skin becomes reddened/irritated stop using the CHG and inform your nurse when you arrive at Short Stay. Do not shave (including legs and underarms) for at least 48 hours prior to the first CHG shower.  You may shave your face/neck. Please follow these instructions carefully:  1.  Shower with CHG Soap the night before surgery and the  morning of Surgery.  2.  If you choose to wash your hair, wash your hair first as usual with your  normal  shampoo.  3.  After you shampoo,  rinse your hair and body thoroughly to remove the  shampoo.                           4.  Use CHG as you would any other liquid soap.  You can apply chg directly  to the skin and wash                       Gently with a scrungie or clean washcloth.  5.  Apply the CHG Soap to your body ONLY FROM THE NECK DOWN.   Do not use on face/ open                           Wound or open sores. Avoid contact with eyes, ears mouth and genitals (private parts).                       Wash face,  Genitals (private parts) with your normal soap.             6.  Wash thoroughly, paying special attention to the area where your surgery  will be performed.  7.  Thoroughly rinse your body with  warm water from the neck down.  8.  DO NOT shower/wash with your normal soap after using and rinsing off  the CHG Soap.                9.  Pat yourself dry with a clean towel.            10.  Wear clean pajamas.            11.  Place clean sheets on your bed the night of your first shower and do not  sleep with pets. Day of Surgery : Do not apply any lotions/deodorants the morning of surgery.  Please wear clean clothes to the hospital/surgery center.  FAILURE TO FOLLOW THESE INSTRUCTIONS MAY RESULT IN THE CANCELLATION OF YOUR SURGERY PATIENT SIGNATURE_________________________________  NURSE SIGNATURE__________________________________  ________________________________________________________________________

## 2021-05-31 ENCOUNTER — Other Ambulatory Visit: Payer: Self-pay

## 2021-05-31 ENCOUNTER — Encounter (HOSPITAL_COMMUNITY)
Admission: RE | Admit: 2021-05-31 | Discharge: 2021-05-31 | Disposition: A | Discharge status: Home / Self Care | Payer: 59 | Source: Ambulatory Visit | Attending: Surgery | Admitting: Surgery

## 2021-05-31 ENCOUNTER — Encounter (HOSPITAL_COMMUNITY): Payer: Self-pay

## 2021-05-31 ENCOUNTER — Ambulatory Visit (INDEPENDENT_AMBULATORY_CARE_PROVIDER_SITE_OTHER): Payer: 59 | Admitting: Internal Medicine

## 2021-05-31 VITALS — BP 137/86 | HR 87 | Resp 18 | Ht 71.0 in | Wt 295.0 lb

## 2021-05-31 DIAGNOSIS — R142 Eructation: Secondary | ICD-10-CM | POA: Diagnosis not present

## 2021-05-31 DIAGNOSIS — Z79899 Other long term (current) drug therapy: Secondary | ICD-10-CM | POA: Diagnosis not present

## 2021-05-31 DIAGNOSIS — K6289 Other specified diseases of anus and rectum: Secondary | ICD-10-CM

## 2021-05-31 DIAGNOSIS — E662 Morbid (severe) obesity with alveolar hypoventilation: Secondary | ICD-10-CM

## 2021-05-31 DIAGNOSIS — D751 Secondary polycythemia: Secondary | ICD-10-CM

## 2021-05-31 DIAGNOSIS — Z6841 Body Mass Index (BMI) 40.0 and over, adult: Secondary | ICD-10-CM | POA: Diagnosis not present

## 2021-05-31 DIAGNOSIS — Z1509 Genetic susceptibility to other malignant neoplasm: Secondary | ICD-10-CM

## 2021-05-31 DIAGNOSIS — Z01812 Encounter for preprocedural laboratory examination: Secondary | ICD-10-CM | POA: Diagnosis present

## 2021-05-31 DIAGNOSIS — G4733 Obstructive sleep apnea (adult) (pediatric): Secondary | ICD-10-CM | POA: Insufficient documentation

## 2021-05-31 DIAGNOSIS — Z7901 Long term (current) use of anticoagulants: Secondary | ICD-10-CM

## 2021-05-31 DIAGNOSIS — E7800 Pure hypercholesterolemia, unspecified: Secondary | ICD-10-CM | POA: Diagnosis not present

## 2021-05-31 DIAGNOSIS — I10 Essential (primary) hypertension: Secondary | ICD-10-CM

## 2021-05-31 DIAGNOSIS — Z9989 Dependence on other enabling machines and devices: Secondary | ICD-10-CM

## 2021-05-31 DIAGNOSIS — R141 Gas pain: Secondary | ICD-10-CM | POA: Diagnosis not present

## 2021-05-31 DIAGNOSIS — Z1379 Encounter for other screening for genetic and chromosomal anomalies: Secondary | ICD-10-CM | POA: Diagnosis not present

## 2021-05-31 DIAGNOSIS — R42 Dizziness and giddiness: Secondary | ICD-10-CM

## 2021-05-31 DIAGNOSIS — Z09 Encounter for follow-up examination after completed treatment for conditions other than malignant neoplasm: Secondary | ICD-10-CM

## 2021-05-31 DIAGNOSIS — R7303 Prediabetes: Secondary | ICD-10-CM

## 2021-05-31 DIAGNOSIS — Z1501 Genetic susceptibility to malignant neoplasm of breast: Secondary | ICD-10-CM

## 2021-05-31 DIAGNOSIS — R143 Flatulence: Secondary | ICD-10-CM | POA: Diagnosis not present

## 2021-05-31 DIAGNOSIS — Z8041 Family history of malignant neoplasm of ovary: Secondary | ICD-10-CM

## 2021-05-31 DIAGNOSIS — R066 Hiccough: Secondary | ICD-10-CM

## 2021-05-31 DIAGNOSIS — K645 Perianal venous thrombosis: Secondary | ICD-10-CM

## 2021-05-31 DIAGNOSIS — Z803 Family history of malignant neoplasm of breast: Secondary | ICD-10-CM

## 2021-05-31 DIAGNOSIS — Z8042 Family history of malignant neoplasm of prostate: Secondary | ICD-10-CM

## 2021-05-31 DIAGNOSIS — Z8481 Family history of carrier of genetic disease: Secondary | ICD-10-CM

## 2021-05-31 DIAGNOSIS — Z0000 Encounter for general adult medical examination without abnormal findings: Secondary | ICD-10-CM | POA: Insufficient documentation

## 2021-05-31 DIAGNOSIS — Z7982 Long term (current) use of aspirin: Secondary | ICD-10-CM | POA: Insufficient documentation

## 2021-05-31 DIAGNOSIS — E782 Mixed hyperlipidemia: Secondary | ICD-10-CM | POA: Diagnosis not present

## 2021-05-31 DIAGNOSIS — K219 Gastro-esophageal reflux disease without esophagitis: Secondary | ICD-10-CM | POA: Insufficient documentation

## 2021-05-31 DIAGNOSIS — Z7189 Other specified counseling: Secondary | ICD-10-CM | POA: Insufficient documentation

## 2021-05-31 DIAGNOSIS — E291 Testicular hypofunction: Secondary | ICD-10-CM

## 2021-05-31 HISTORY — DX: Prediabetes: R73.03

## 2021-05-31 LAB — CBC WITH DIFFERENTIAL/PLATELET
Abs Immature Granulocytes: 0.07 10*3/uL (ref 0.00–0.07)
Basophils Absolute: 0.1 10*3/uL (ref 0.0–0.1)
Basophils Relative: 1 %
Eosinophils Absolute: 0.2 10*3/uL (ref 0.0–0.5)
Eosinophils Relative: 3 %
HCT: 46 % (ref 39.0–52.0)
Hemoglobin: 15.1 g/dL (ref 13.0–17.0)
Immature Granulocytes: 1 %
Lymphocytes Relative: 32 %
Lymphs Abs: 2.2 10*3/uL (ref 0.7–4.0)
MCH: 26.4 pg (ref 26.0–34.0)
MCHC: 32.8 g/dL (ref 30.0–36.0)
MCV: 80.3 fL (ref 80.0–100.0)
Monocytes Absolute: 0.7 10*3/uL (ref 0.1–1.0)
Monocytes Relative: 10 %
Neutro Abs: 3.7 10*3/uL (ref 1.7–7.7)
Neutrophils Relative %: 53 %
Platelets: 278 10*3/uL (ref 150–400)
RBC: 5.73 MIL/uL (ref 4.22–5.81)
RDW: 13.2 % (ref 11.5–15.5)
WBC: 6.8 10*3/uL (ref 4.0–10.5)
nRBC: 0 % (ref 0.0–0.2)

## 2021-05-31 LAB — COMPREHENSIVE METABOLIC PANEL
ALT: 49 U/L — ABNORMAL HIGH (ref 0–44)
AST: 35 U/L (ref 15–41)
Albumin: 4 g/dL (ref 3.5–5.0)
Alkaline Phosphatase: 44 U/L (ref 38–126)
Anion gap: 7 (ref 5–15)
BUN: 14 mg/dL (ref 6–20)
CO2: 24 mmol/L (ref 22–32)
Calcium: 9.4 mg/dL (ref 8.9–10.3)
Chloride: 105 mmol/L (ref 98–111)
Creatinine, Ser: 0.87 mg/dL (ref 0.61–1.24)
GFR, Estimated: >60 mL/min (ref >60)
Glucose, Bld: 93 mg/dL (ref 70–99)
Potassium: 4.1 mmol/L (ref 3.5–5.1)
Sodium: 136 mmol/L (ref 135–145)
Total Bilirubin: 0.5 mg/dL (ref 0.3–1.2)
Total Protein: 7.2 g/dL (ref 6.5–8.1)

## 2021-05-31 LAB — GLUCOSE, CAPILLARY: Glucose-Capillary: 89 mg/dL (ref 70–99)

## 2021-05-31 LAB — HEMOGLOBIN A1C
Hgb A1c MFr Bld: 6.2 % — ABNORMAL HIGH (ref 4.8–5.6)
Mean Plasma Glucose: 131.24 mg/dL

## 2021-05-31 NOTE — Progress Notes (Signed)
[facility] [place], [place] [address]  Pulmonary Sleep Medicine   Office Visit Note  Patient Name: Teagan Heidrick DOB: 1970-12-14 MRN [id number]    Chief Complaint: Obstructive Sleep Apnea visit  Brief History:  Sheriff is seen today for one year follow up The patient has a 4 year history of sleep apnea. Patient is using PAP nightly  @ 14 cmH2o.  The patient feels refreshed after sleeping with PAP.  The patient reports benefiting from PAP use. Reported sleepiness is  improved and the Epworth Sleepiness Score is 5 out of 24. The patient does not take naps. The patient complains of the following: wanting to sleep more. The compliance download shows  compliance with an average use time of 7:12 hours. The AHI is 1.7  The patient does not complain of limb movements disrupting sleep.    ROS  General: (-) fever, (-) chills, (-) night sweat Nose and Sinuses: (-) nasal stuffiness or itchiness, (-) postnasal drip, (-) nosebleeds, (-) sinus trouble. Mouth and Throat: (-) sore throat, (-) hoarseness. Neck: (-) swollen glands, (-) enlarged thyroid, (-) neck pain. Respiratory: - cough, - shortness of breath, - wheezing. Neurologic: - numbness, - tingling. Psychiatric: - anxiety, - depression   Current Medication: Outpatient Encounter Medications as of 05/31/2021  Medication Sig   albuterol (VENTOLIN HFA) 108 (90 Base) MCG/ACT inhaler Inhale 2 puffs into the lungs every 4 (four) hours as needed for wheezing or shortness of breath.   aspirin EC 81 MG tablet Take 81 mg by mouth daily. Swallow whole.   cetirizine (ZYRTEC) 10 MG tablet Take 10 mg by mouth daily.   cholecalciferol (VITAMIN D) 25 MCG (1000 UNIT) tablet Take 1,000 Units by mouth daily.   ELIQUIS 5 MG TABS tablet TAKE 1 TABLET BY MOUTH TWICE A DAY (Patient not taking: No sig reported)   EPINEPHrine 0.3 mg/0.3 mL IJ SOAJ injection Inject 0.3 mg into the muscle as needed for anaphylaxis.   esomeprazole  (NEXIUM) 40 MG capsule Take 40 mg by mouth at bedtime.   lisinopril-hydrochlorothiazide (ZESTORETIC) 20-12.5 MG tablet Take 1 tablet by mouth daily.   metFORMIN (GLUCOPHAGE) 500 MG tablet Take 1 tablet (500 mg total) by mouth daily with breakfast. (Patient not taking: No sig reported)   montelukast (SINGULAIR) 10 MG tablet Take 10 mg by mouth at bedtime.    Multiple Vitamins-Minerals (MULTIVITAMIN WITH MINERALS) tablet Take 1 tablet by mouth daily.   NON FORMULARY Pt uses a cpap nightly   omega-3 acid ethyl esters (LOVAZA) 1 G capsule Take 1 g by mouth daily.   polyvinyl alcohol (LIQUIFILM TEARS) 1.4 % ophthalmic solution Place 1 drop into both eyes as needed for dry eyes.   simvastatin (ZOCOR) 10 MG tablet TAKE 1 TABLET BY MOUTH EVERYDAY AT BEDTIME   vitamin B-12 (CYANOCOBALAMIN) 100 MCG tablet Take 100 mcg by mouth daily.   zinc gluconate 50 MG tablet Take 50 mg by mouth daily.   No facility-administered encounter medications on file as of 05/31/2021.    Surgical History: Past Surgical History:  Procedure Laterality Date   bisep Left 2007   COLONOSCOPY WITH PROPOFOL N/A 03/01/2019   Procedure: COLONOSCOPY WITH PROPOFOL;  Surgeon: Jonathon Bellows, MD;  Location: [facility] ENDOSCOPY;  Service: Gastroenterology;  Laterality: N/A;   KNEE SURGERY Right    LASIK     PULMONARY THROMBECTOMY Bilateral 10/23/2020   Procedure: PULMONARY THROMBECTOMY;  Surgeon: Algernon Huxley, MD;  Location: [facility];  Service: Cardiovascular;  Laterality: Bilateral;   TONSILLECTOMY     UMBILICAL HERNIA REPAIR N/A 07/29/2015   Procedure: HERNIA REPAIR UMBILICAL ADULT;  Surgeon: Christene Lye, MD;  Location: [facility] ORS;  Service: General;  Laterality: N/A;   VASOTOMY      Medical History: Past Medical History:  Diagnosis Date   Asthma    Dizziness    Family history of BRCA1 gene positive    Family history of breast cancer    Family history of ovarian cancer    Family history of prostate cancer     GERD (gastroesophageal reflux disease)    Hemorrhoid    High blood pressure    High cholesterol    Hx of blood clots    Hypercholesteremia    Polycythemia    Pre-diabetes    Sleep apnea    cpap    Family History: Non contributory to the present illness  Social History: Social History   Socioeconomic History   Marital status: Married    Spouse name: [name]   Number of children: 1   Years of education: 13   Highest education level: Not on file  Occupational History   Occupation: work full time    Comment: [employer]  Tobacco Use   Smoking status: Never   Smokeless tobacco: Never  Vaping Use   Vaping Use: Never used  Substance and Sexual Activity   Alcohol use: Never   Drug use: Never   Sexual activity: Yes  Other Topics Concern   Not on file  Social History Narrative   Patient lives at home  with his wife [name]).  Patient works full time for [employer].   Education some college.   Right handed.   Caffeine one shot of colombian  coffee.   Social Determinants of Health   Financial Resource Strain: Not on file  Food Insecurity: Not on file  Transportation Needs: Not on file  Physical Activity: Not on file  Stress: Not on file  Social Connections: Not on file  Intimate Partner Violence: Not on file    Vital Signs: There were no vitals taken for this visit.  Examination: General Appearance: The patient is well-developed, well-nourished, and in no distress. Neck Circumference: 47.5 cm Skin: Gross inspection of skin unremarkable. Head: normocephalic, no gross deformities. Eyes: no gross deformities noted. ENT: ears appear grossly normal Neurologic: Alert and oriented. No involuntary movements.    EPWORTH SLEEPINESS SCALE:  Scale:  (0)= no chance of dozing; (1)= slight chance of dozing; (2)= moderate chance of dozing; (3)= high chance of dozing  Chance  Situtation    Sitting and reading: 1    Watching TV: 1    Sitting Inactive  in public: 0    As a passenger in car: 1      Lying down to rest: 2    Sitting and talking: 0    Sitting quielty after lunch: 0    In a car, stopped in traffic: 0   TOTAL SCORE:   5 out of 24    SLEEP STUDIES:  PSG 05/08/17  -   AHI 67,  SpO60mn 53%   CPAP COMPLIANCE DATA:  Date Range: 05/31/20 - 05/30/21  Average Daily Use: 7:12 hours  Median Use: 7:09 hours  Compliance for > 4 Hours: 100%  AHI: 1.7 respiratory events per hour  Days Used: 365/365  Mask Leak: 15 lpm  95th Percentile Pressure: 14 cmH2O         LABS: Recent  Results (from the past 2160 hour(s))  ECHOCARDIOGRAM COMPLETE     Status: None   Collection Time: 04/28/21 10:33 AM  Result Value Ref Range   Ao pk vel 1.40 m/s   AV Area VTI 2.89 cm2   AR max vel 2.75 cm2   AV Mean grad 4.0 mmHg   AV Peak grad 7.8 mmHg   S' Lateral 3.46 cm   AV Area mean vel 3.05 cm2   Area-P 1/2 4.36 cm2   MV VTI 3.07 cm2  Comprehensive metabolic panel     Status: Abnormal   Collection Time: 05/03/21  1:26 PM  Result Value Ref Range   Sodium 137 135 - 145 mmol/L   Potassium 4.1 3.5 - 5.1 mmol/L   Chloride 103 98 - 111 mmol/L   CO2 26 22 - 32 mmol/L   Glucose, Bld 110 (H) 70 - 99 mg/dL    Comment: Glucose reference range applies only to samples taken after fasting for at least 8 hours.   BUN 14 6 - 20 mg/dL   Creatinine, Ser 1.03 0.61 - 1.24 mg/dL   Calcium 9.1 8.9 - 10.3 mg/dL   Total Protein 7.2 6.5 - 8.1 g/dL   Albumin 4.1 3.5 - 5.0 g/dL   AST 31 15 - 41 U/L   ALT 46 (H) 0 - 44 U/L   Alkaline Phosphatase 42 38 - 126 U/L   Total Bilirubin 0.5 0.3 - 1.2 mg/dL   GFR, Estimated >60 >60 mL/min    Comment: (NOTE) Calculated using the CKD-EPI Creatinine Equation (2021)    Anion gap 8 5 - 15    Comment: Performed at [facility], [address] [place], [place] [address]  CBC with Differential     Status: None   Collection Time: 05/03/21  1:26 PM  Result Value Ref Range   WBC 6.8 4.0 - 10.5  K/uL   RBC 5.52 4.22 - 5.81 MIL/uL   Hemoglobin 14.6 13.0 - 17.0 g/dL   HCT 44.2 39.0 - 52.0 %   MCV 80.1 80.0 - 100.0 fL   MCH 26.4 26.0 - 34.0 pg   MCHC 33.0 30.0 - 36.0 g/dL   RDW 13.3 11.5 - 15.5 %   Platelets 241 150 - 400 K/uL   nRBC 0.0 0.0 - 0.2 %   Neutrophils Relative % 57 %   Neutro Abs 3.8 1.7 - 7.7 K/uL   Lymphocytes Relative 32 %   Lymphs Abs 2.2 0.7 - 4.0 K/uL   Monocytes Relative 9 %   Monocytes Absolute 0.6 0.1 - 1.0 K/uL   Eosinophils Relative 2 %   Eosinophils Absolute 0.1 0.0 - 0.5 K/uL   Basophils Relative 0 %   Basophils Absolute 0.0 0.0 - 0.1 K/uL   Immature Granulocytes 0 %   Abs Immature Granulocytes 0.02 0.00 - 0.07 K/uL    Comment: Performed at [facility], [address] [place], [place] [address]  Glucose, capillary     Status: None   Collection Time: 05/31/21 10:04 AM  Result Value Ref Range   Glucose-Capillary 89 70 - 99 mg/dL    Comment: Glucose reference range applies only to samples taken after fasting for at least 8 hours.  CBC WITH DIFFERENTIAL     Status: None   Collection Time: 05/31/21 10:15 AM  Result Value Ref Range   WBC 6.8 4.0 - 10.5 K/uL   RBC 5.73 4.22 - 5.81 MIL/uL   Hemoglobin 15.1 13.0 - 17.0 g/dL  HCT 46.0 39.0 - 52.0 %   MCV 80.3 80.0 - 100.0 fL   MCH 26.4 26.0 - 34.0 pg   MCHC 32.8 30.0 - 36.0 g/dL   RDW 13.2 11.5 - 15.5 %   Platelets 278 150 - 400 K/uL   nRBC 0.0 0.0 - 0.2 %   Neutrophils Relative % 53 %   Neutro Abs 3.7 1.7 - 7.7 K/uL   Lymphocytes Relative 32 %   Lymphs Abs 2.2 0.7 - 4.0 K/uL   Monocytes Relative 10 %   Monocytes Absolute 0.7 0.1 - 1.0 K/uL   Eosinophils Relative 3 %   Eosinophils Absolute 0.2 0.0 - 0.5 K/uL   Basophils Relative 1 %   Basophils Absolute 0.1 0.0 - 0.1 K/uL   Immature Granulocytes 1 %   Abs Immature Granulocytes 0.07 0.00 - 0.07 K/uL    Comment: Performed at [facility], [place] [address], [place], [place] [address]  Comprehensive metabolic panel      Status: Abnormal   Collection Time: 05/31/21 10:15 AM  Result Value Ref Range   Sodium 136 135 - 145 mmol/L   Potassium 4.1 3.5 - 5.1 mmol/L   Chloride 105 98 - 111 mmol/L   CO2 24 22 - 32 mmol/L   Glucose, Bld 93 70 - 99 mg/dL    Comment: Glucose reference range applies only to samples taken after fasting for at least 8 hours.   BUN 14 6 - 20 mg/dL   Creatinine, Ser 0.87 0.61 - 1.24 mg/dL   Calcium 9.4 8.9 - 10.3 mg/dL   Total Protein 7.2 6.5 - 8.1 g/dL   Albumin 4.0 3.5 - 5.0 g/dL   AST 35 15 - 41 U/L   ALT 49 (H) 0 - 44 U/L   Alkaline Phosphatase 44 38 - 126 U/L   Total Bilirubin 0.5 0.3 - 1.2 mg/dL   GFR, Estimated >60 >60 mL/min    Comment: (NOTE) Calculated using the CKD-EPI Creatinine Equation (2021)    Anion gap 7 5 - 15    Comment: Performed at [facility], [place] [address], [place], [place] [address]  Hemoglobin A1c     Status: Abnormal   Collection Time: 05/31/21 10:15 AM  Result Value Ref Range   Hgb A1c MFr Bld 6.2 (H) 4.8 - 5.6 %    Comment: (NOTE) Pre diabetes:          5.7%-6.4%  Diabetes:              >6.4%  Glycemic control for   <7.0% adults with diabetes    Mean Plasma Glucose 131.24 mg/dL    Comment: Performed at [facility] [address], [place], [place] [address]    Radiology: No results found.  No results found.  No results found.    Assessment and Plan: Patient Active Problem List   Diagnosis Date Noted   BRCA1 gene mutation positive 01/12/2021   Genetic testing 01/12/2021   Family history of BRCA1 gene positive    Family history of breast cancer    Family history of ovarian cancer    Family history of prostate cancer    Prediabetes 11/19/2020   Flatulence, eructation and gas pain 11/03/2020   Acid reflux 11/03/2020   Hiccoughs 11/03/2020   Proctalgia 11/03/2020   Thrombosed external hemorrhoids 11/03/2020   Hospital discharge follow-up 10/30/2020   Long term current use of anticoagulant 10/30/2020    Chest pain due to myocardial ischemia 10/22/2020  Pulmonary embolism ([place]) 10/22/2020   Hypogonadism in male 10/22/2020   Mixed hyperlipidemia 05/21/2020   Hypertension 05/21/2020   Morbid obesity with alveolar hypoventilation ([place]) 05/21/2020   Polycythemia 07/29/2019   High cholesterol    Dizziness    1. OSA on CPAP The patient does tolerate PAP and reports definite benefit from PAP use. The patient was reminded how to clean equipment  and advised to replace supplies routinely. The patient was also counselled on weight loss, he is having bariatric surgery next month. The compliance is excellent. The AHI is 1.7.   OSA- continue with excellent compliance.    2. CPAP use counseling CPAP Counseling: had a lengthy discussion with the patient regarding the importance of PAP therapy in management of the sleep apnea. Patient appears to understand the risk factor reduction and also understands the risks associated with untreated sleep apnea. Patient will try to make a good faith effort to remain compliant with therapy. Also instructed the patient on proper cleaning of the device including the water must be changed daily if possible and use of distilled water is preferred. Patient understands that the machine should be regularly cleaned with appropriate recommended cleaning solutions that do not damage the PAP machine for example given white vinegar and water rinses. Other methods such as ozone treatment may not be as good as these simple methods to achieve cleaning.   3. Primary hypertension Hypertension Counseling:   The following hypertensive lifestyle modification were recommended and discussed:  1. Limiting alcohol intake to less than 1 oz/day of ethanol:(24 oz of beer or 8 oz of wine or 2 oz of 100-proof whiskey). 2. Take baby ASA 81 mg daily. 3. Importance of regular aerobic exercise and losing weight. 4. Reduce dietary saturated fat and cholesterol intake for overall cardiovascular  health. 5. Maintaining adequate dietary potassium, calcium, and magnesium intake. 6. Regular monitoring of the blood pressure. 7. Reduce sodium intake to less than 100 mmol/day (less than 2.3 gm of sodium or less than 6 gm of sodium choride)    4. Morbid obesity ([place]) Obesity Counseling: Had a lengthy discussion regarding patients BMI and weight issues. Patient was instructed on portion control as well as increased activity. Also discussed caloric restrictions with trying to maintain intake less than 2000 Kcal. Discussions were made in accordance with the 5As of weight management. Simple actions such as not eating late and if able to, taking a walk is suggested.     .   General Counseling: I have discussed the findings of the evaluation and examination with Mikeal Hawthorne.  I have also discussed any further diagnostic evaluation thatmay be needed or ordered today. Arsh verbalizes understanding of the findings of todays visit. We also reviewed his medications today and discussed drug interactions and side effects including but not limited excessive drowsiness and altered mental states. We also discussed that there is always a risk not just to him but also people around him. he has been encouraged to call the office with any questions or concerns that should arise related to todays visit.  No orders of the defined types were placed in this encounter.       I have personally obtained a history, examined the patient, evaluated laboratory and imaging results, formulated the assessment and plan and placed orders.   This patient was seen today by Tressie Ellis, PA-C in collaboration with Dr. Devona Konig.     Allyne Gee, MD [facility] Diplomate ABMS Pulmonary and Critical Care Medicine Sleep medicine

## 2021-05-31 NOTE — Patient Instructions (Signed)

## 2021-06-02 NOTE — Progress Notes (Signed)
Anesthesia Chart Review   Case: [id number] Date/Time: 06/15/21 0815   Procedures:      LAPAROSCOPIC GASTRIC SLEEVE RESECTION     UPPER GI ENDOSCOPY   Anesthesia type: General   Pre-op diagnosis: morbid obesity   Location: [place] [facility] / [facility] ORS   Surgeons: Johnathan Hausen, MD       DISCUSSION:50 y.o. never smoker with h/o GERD, asthma, sleep apnea with cpap, unprovoked PE 10/2020, morbid obesity scheduled for above procedure 06/15/2021 with Dr. Johnathan Hausen.   Pt last seen by hematology 05/03/2021. Per OV note pt stable. Eliquis stopped at this visit.  Follow up with hematology prn.   Anticipate pt can proceed with planned procedure barring acute status change.   VS: BP (!) 145/88   Pulse 73   Temp 36.8 C (Oral)   Resp 16   Ht '5\' 11"'  (1.803 m)   Wt 133.8 kg   SpO2 97%   BMI 41.14 kg/m   PROVIDERS: Cletis Athens, MD is PCP    LABS: Labs reviewed: Acceptable for surgery. (all labs ordered are listed, but only abnormal results are displayed)  Labs Reviewed  COMPREHENSIVE METABOLIC PANEL - Abnormal; Notable for the following components:      Result Value   ALT 49 (*)    All other components within normal limits  HEMOGLOBIN A1C - Abnormal; Notable for the following components:   Hgb A1c MFr Bld 6.2 (*)    All other components within normal limits  CBC WITH DIFFERENTIAL/PLATELET  GLUCOSE, CAPILLARY     IMAGES:   EKG: 10/22/2020 Rate 99 bpm  Normal sinus rhythm Left axis deviation Pulmonary disease pattern ST & T wave abnormality, consider anterior ischemia Prolonged QT Abnormal ECG  CV: Echo 04/28/2021  1. Left ventricular ejection fraction, by estimation, is 55 to 60%. The  left ventricle has normal function. The left ventricle has no regional  wall motion abnormalities. Left ventricular diastolic parameters were  normal. The average left ventricular  global longitudinal strain is -17.2 %. The global longitudinal strain is  normal.   2. Right ventricular  systolic function is normal. The right ventricular  size is normal.   3. The mitral valve is normal in structure. No evidence of mitral valve  regurgitation.   4. The aortic valve is tricuspid. Aortic valve regurgitation is not  visualized.  Past Medical History:  Diagnosis Date   Asthma    Dizziness    Family history of BRCA1 gene positive    Family history of breast cancer    Family history of ovarian cancer    Family history of prostate cancer    GERD (gastroesophageal reflux disease)    Hemorrhoid    High blood pressure    High cholesterol    Hx of blood clots    Hypercholesteremia    Polycythemia    Pre-diabetes    Sleep apnea    cpap    Past Surgical History:  Procedure Laterality Date   bisep Left 2007   COLONOSCOPY WITH PROPOFOL N/A 03/01/2019   Procedure: COLONOSCOPY WITH PROPOFOL;  Surgeon: Jonathon Bellows, MD;  Location:  [facility] ENDOSCOPY;  Service: Gastroenterology;  Laterality: N/A;   KNEE SURGERY Right    LASIK     PULMONARY THROMBECTOMY Bilateral 10/23/2020   Procedure: PULMONARY THROMBECTOMY;  Surgeon: Algernon Huxley, MD;  Location: [place] LAB;  Service: Cardiovascular;  Laterality: Bilateral;   TONSILLECTOMY     UMBILICAL HERNIA REPAIR N/A 07/29/2015   Procedure: HERNIA REPAIR  UMBILICAL ADULT;  Surgeon: Christene Lye, MD;  Location: [facility] ORS;  Service: General;  Laterality: N/A;   VASOTOMY      MEDICATIONS:  albuterol (VENTOLIN HFA) 108 (90 Base) MCG/ACT inhaler   aspirin EC 81 MG tablet   cetirizine (ZYRTEC) 10 MG tablet   cholecalciferol (VITAMIN D) 25 MCG (1000 UNIT) tablet   EPINEPHrine 0.3 mg/0.3 mL IJ SOAJ injection   esomeprazole (NEXIUM) 40 MG capsule   lisinopril-hydrochlorothiazide (ZESTORETIC) 20-12.5 MG tablet   montelukast (SINGULAIR) 10 MG tablet   Multiple Vitamins-Minerals (MULTIVITAMIN WITH MINERALS) tablet   NON FORMULARY   omega-3 acid ethyl esters (LOVAZA) 1 G capsule   polyvinyl alcohol (LIQUIFILM TEARS) 1.4 %  ophthalmic solution   simvastatin (ZOCOR) 10 MG tablet   vitamin B-12 (CYANOCOBALAMIN) 100 MCG tablet   zinc gluconate 50 MG tablet   No current facility-administered medications for this encounter.    Konrad Felix Ward, PA-C [facility] Pre-Surgical Testing [phone]

## 2021-06-02 NOTE — Anesthesia Preprocedure Evaluation (Addendum)
Anesthesia Evaluation  Patient identified by MRN, date of birth, ID band Patient awake    Reviewed: Allergy & Precautions, NPO status , Patient's Chart, lab work & pertinent test results  Airway Mallampati: III  TM Distance: >3 FB Neck ROM: Full    Dental  (+) Teeth Intact, Dental Advisory Given   Pulmonary asthma , sleep apnea and Continuous Positive Airway Pressure Ventilation , PE (10/2020)   Pulmonary exam normal breath sounds clear to auscultation       Cardiovascular hypertension, Pt. on medications Normal cardiovascular exam Rhythm:Regular Rate:Normal  EKG: 10/22/2020 Rate 99 bpm  Normal sinus rhythm Left axis deviation Pulmonary disease pattern ST & T wave abnormality, consider anterior ischemia Prolonged QT Abnormal ECG  CV: Echo 04/28/2021 1. Left ventricular ejection fraction, by estimation, is 55 to 60%. The  left ventricle has normal function. The left ventricle has no regional  wall motion abnormalities. Left ventricular diastolic parameters were  normal. The average left ventricular  global longitudinal strain is -17.2 %. The global longitudinal strain is  normal.  2. Right ventricular systolic function is normal. The right ventricular  size is normal.  3. The mitral valve is normal in structure. No evidence of mitral valve  regurgitation.  4. The aortic valve is tricuspid. Aortic valve regurgitation is not  visualized.   Neuro/Psych negative neurological ROS  negative psych ROS   GI/Hepatic Neg liver ROS, GERD  Medicated and Controlled,  Endo/Other  Morbid obesity  Renal/GU negative Renal ROS  negative genitourinary   Musculoskeletal negative musculoskeletal ROS (+)   Abdominal   Peds  Hematology negative hematology ROS (+)   Anesthesia Other Findings   Reproductive/Obstetrics                           Anesthesia Physical Anesthesia Plan  ASA:  3  Anesthesia Plan: General   Post-op Pain Management:    Induction: Intravenous  PONV Risk Score and Plan: 2 and Midazolam, Dexamethasone and Ondansetron  Airway Management Planned: Oral ETT  Additional Equipment:   Intra-op Plan:   Post-operative Plan: Extubation in OR  Informed Consent: I have reviewed the patients History and Physical, chart, labs and discussed the procedure including the risks, benefits and alternatives for the proposed anesthesia with the patient or authorized representative who has indicated his/her understanding and acceptance.     Dental advisory given  Plan Discussed with: CRNA  Anesthesia Plan Comments:       Anesthesia Quick Evaluation

## 2021-06-08 ENCOUNTER — Ambulatory Visit: Payer: Self-pay | Admitting: Surgery

## 2021-06-11 ENCOUNTER — Other Ambulatory Visit: Payer: Self-pay | Admitting: Surgery

## 2021-06-11 LAB — SARS CORONAVIRUS 2 (TAT 6-24 HRS): SARS Coronavirus 2: NEGATIVE

## 2021-06-14 NOTE — H&P (Signed)
MRN: [id number] DOB: 03-22-1971  Chief Complaint: Pre-op Exam for sleeve gastrectomy   History of Present Illness: Christopher Burns is a 50 y.o. male who is seen today for preop evaluation for his surgery coming up on November 1 we are scheduled to do a sleeve gastrectomy. I filled out his FMLA papers myself.  He did have a PE in March and was on Eliquis until September 4 when this was stopped. There was not a source documented for this. He has previously been noted to be primary prediabetic, have GERD with Nexium, previous umbilical hernia repair, and has obstructive sleep apnea uses a CPAP. He is scheduled for sleeve gastrectomy on November 1. Today's BMI is 41 with a weight of 294.8.  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  Review of Systems: See HPI as well for other ROS.  ROS   Medical History: Past Medical History:  Diagnosis Date   Asthma, unspecified asthma severity, unspecified whether complicated, unspecified whether persistent   Chronic kidney disease   Hyperlipidemia   Hypertension   Sleep apnea   Patient Active Problem List  Diagnosis   BRCA1 gene mutation positive   Pulmonary embolism (CMS-HCC)   Prediabetes   Morbid obesity with alveolar hypoventilation (CMS-HCC)   Mixed hyperlipidemia   Hypertension   Acid reflux   Past Surgical History:  Procedure Laterality Date   HERNIA REPAIR 27/01/2375  umbilical hernia   pulmonary thrombectomy 10/23/2020    No Known Allergies  Current Outpatient Medications on File Prior to Visit  Medication Sig Dispense Refill   ascorbic acid, vitamin C, (VITAMIN C) 500 MG tablet Take by mouth   cetirizine (ZYRTEC) 10 MG tablet Take 10 mg by mouth once daily   cholecalciferol (VITAMIN D3) 1000 unit tablet Take by mouth   cyanocobalamin (VITAMIN B12) 100 MCG tablet Take by mouth   ELIQUIS 5 mg tablet   esomeprazole (NEXIUM) 40 MG DR capsule Take 40 mg by mouth once daily    metFORMIN (GLUCOPHAGE) 500 MG tablet Take 500 mg by mouth daily with breakfast   montelukast (SINGULAIR) 10 mg tablet Take 10 mg by mouth once daily   multivitamin with minerals tablet Take 1 tablet by mouth once daily   omega-3 acid ethyl esters (LOVAZA) 1 gram capsule Take by mouth   simvastatin (ZOCOR) 10 MG tablet Take 10 mg by mouth nightly   zinc gluconate 50 mg tablet Take by mouth   No current facility-administered medications on file prior to visit.   Family History  Problem Relation Age of Onset   Hyperlipidemia (Elevated cholesterol) Mother   Breast cancer Mother   Diabetes Father   High blood pressure (Hypertension) Father   Hyperlipidemia (Elevated cholesterol) Father   Cancer Maternal Grandmother   Prostate cancer Paternal Grandfather    Social History   Tobacco Use  Smoking Status Never Smoker  Smokeless Tobacco Never Used    Social History   Socioeconomic History   Marital status: Married  Tobacco Use   Smoking status: Never Smoker   Smokeless tobacco: Never Used  Scientific laboratory technician Use: Never used  Substance and Sexual Activity   Alcohol use: Never   Drug use: Never   Objective:   Vitals:  05/26/21 1031  BP: (!) 144/72  Pulse: 92  SpO2: 97%  Weight: (!) 133.7 kg (294 lb 12.8 oz)  Height: 180.3 cm (_0 )   Body mass index is  41.12 kg/m.  Physical Exam General: Obese white male HEENT: Unremarkable Chest : Clear to auscultation Heart: Sinus rhythm- he had a echo in September which was okay Breast: Unremarkable Abdomen: Obese prior umbilical hernia repair GU not evaluated Rectal not examined Extremities full range of motion Neuro alert and oriented x3. Motor and sensory function grossly intact  Labs, Imaging and Diagnostic Testing:  Upper GI series showed mild gastroesophageal reflux but was otherwise a normal upper GI  Assessment and Plan:  Diagnoses and all orders for this visit:  Morbid (severe) obesity due to excess calories  (CMS-HCC)--for robotic Xi sleeve gastrectomy  History of pulmonary embolism    Orders have been placed in epic for his upcoming surgery on November 1. He may need postoperative Lovenox with his history of PE in March.  No follow-ups on file.  Lathaniel Legate Donia Pounds, MD

## 2021-06-15 ENCOUNTER — Encounter (HOSPITAL_COMMUNITY): Payer: Self-pay | Admitting: Surgery

## 2021-06-15 ENCOUNTER — Inpatient Hospital Stay (HOSPITAL_COMMUNITY): Payer: 59 | Admitting: Anesthesiology

## 2021-06-15 ENCOUNTER — Other Ambulatory Visit: Payer: Self-pay

## 2021-06-15 ENCOUNTER — Inpatient Hospital Stay (HOSPITAL_COMMUNITY): Payer: 59 | Admitting: Physician Assistant

## 2021-06-15 ENCOUNTER — Inpatient Hospital Stay (HOSPITAL_COMMUNITY)
Admission: RE | Admit: 2021-06-15 | Discharge: 2021-06-16 | DRG: 988 | Disposition: A | Discharge status: Home / Self Care | Payer: 59 | Source: Ambulatory Visit | Attending: Surgery | Admitting: Surgery

## 2021-06-15 ENCOUNTER — Encounter (HOSPITAL_COMMUNITY)
Admission: RE | Disposition: A | Discharge status: Home / Self Care | Payer: Self-pay | Source: Ambulatory Visit | Attending: Surgery

## 2021-06-15 ENCOUNTER — Inpatient Hospital Stay (HOSPITAL_COMMUNITY): Payer: 59

## 2021-06-15 DIAGNOSIS — Z803 Family history of malignant neoplasm of breast: Secondary | ICD-10-CM

## 2021-06-15 DIAGNOSIS — R143 Flatulence: Secondary | ICD-10-CM

## 2021-06-15 DIAGNOSIS — E7800 Pure hypercholesterolemia, unspecified: Secondary | ICD-10-CM

## 2021-06-15 DIAGNOSIS — N189 Chronic kidney disease, unspecified: Secondary | ICD-10-CM | POA: Diagnosis present

## 2021-06-15 DIAGNOSIS — Z833 Family history of diabetes mellitus: Secondary | ICD-10-CM

## 2021-06-15 DIAGNOSIS — E662 Morbid (severe) obesity with alveolar hypoventilation: Principal | ICD-10-CM

## 2021-06-15 DIAGNOSIS — Z8042 Family history of malignant neoplasm of prostate: Secondary | ICD-10-CM

## 2021-06-15 DIAGNOSIS — K219 Gastro-esophageal reflux disease without esophagitis: Secondary | ICD-10-CM | POA: Diagnosis present

## 2021-06-15 DIAGNOSIS — I447 Left bundle-branch block, unspecified: Secondary | ICD-10-CM

## 2021-06-15 DIAGNOSIS — Z7984 Long term (current) use of oral hypoglycemic drugs: Secondary | ICD-10-CM

## 2021-06-15 DIAGNOSIS — R141 Gas pain: Secondary | ICD-10-CM

## 2021-06-15 DIAGNOSIS — Z8249 Family history of ischemic heart disease and other diseases of the circulatory system: Secondary | ICD-10-CM

## 2021-06-15 DIAGNOSIS — Z6841 Body Mass Index (BMI) 40.0 and over, adult: Secondary | ICD-10-CM

## 2021-06-15 DIAGNOSIS — K6289 Other specified diseases of anus and rectum: Secondary | ICD-10-CM

## 2021-06-15 DIAGNOSIS — I129 Hypertensive chronic kidney disease with stage 1 through stage 4 chronic kidney disease, or unspecified chronic kidney disease: Secondary | ICD-10-CM | POA: Diagnosis present

## 2021-06-15 DIAGNOSIS — I2699 Other pulmonary embolism without acute cor pulmonale: Secondary | ICD-10-CM

## 2021-06-15 DIAGNOSIS — Z1501 Genetic susceptibility to malignant neoplasm of breast: Secondary | ICD-10-CM

## 2021-06-15 DIAGNOSIS — R7303 Prediabetes: Secondary | ICD-10-CM | POA: Diagnosis present

## 2021-06-15 DIAGNOSIS — Z9884 Bariatric surgery status: Principal | ICD-10-CM

## 2021-06-15 DIAGNOSIS — Z79899 Other long term (current) drug therapy: Secondary | ICD-10-CM

## 2021-06-15 DIAGNOSIS — E782 Mixed hyperlipidemia: Secondary | ICD-10-CM

## 2021-06-15 DIAGNOSIS — K645 Perianal venous thrombosis: Secondary | ICD-10-CM

## 2021-06-15 DIAGNOSIS — Z903 Acquired absence of stomach [part of]: Secondary | ICD-10-CM

## 2021-06-15 DIAGNOSIS — Z09 Encounter for follow-up examination after completed treatment for conditions other than malignant neoplasm: Secondary | ICD-10-CM

## 2021-06-15 DIAGNOSIS — E291 Testicular hypofunction: Secondary | ICD-10-CM

## 2021-06-15 DIAGNOSIS — G4733 Obstructive sleep apnea (adult) (pediatric): Secondary | ICD-10-CM | POA: Diagnosis present

## 2021-06-15 DIAGNOSIS — Z8041 Family history of malignant neoplasm of ovary: Secondary | ICD-10-CM

## 2021-06-15 DIAGNOSIS — D751 Secondary polycythemia: Secondary | ICD-10-CM

## 2021-06-15 DIAGNOSIS — Z8481 Family history of carrier of genetic disease: Secondary | ICD-10-CM

## 2021-06-15 DIAGNOSIS — Z7901 Long term (current) use of anticoagulants: Secondary | ICD-10-CM

## 2021-06-15 DIAGNOSIS — R142 Eructation: Secondary | ICD-10-CM

## 2021-06-15 DIAGNOSIS — R066 Hiccough: Secondary | ICD-10-CM

## 2021-06-15 DIAGNOSIS — Z1509 Genetic susceptibility to other malignant neoplasm: Secondary | ICD-10-CM

## 2021-06-15 DIAGNOSIS — Z1379 Encounter for other screening for genetic and chromosomal anomalies: Secondary | ICD-10-CM

## 2021-06-15 DIAGNOSIS — R42 Dizziness and giddiness: Secondary | ICD-10-CM

## 2021-06-15 HISTORY — PX: UPPER GI ENDOSCOPY: SHX6162

## 2021-06-15 LAB — ECHOCARDIOGRAM LIMITED
Area-P 1/2: 3.72 cm2
Calc EF: 54.5 %
Height: 71 in
S' Lateral: 2.6 cm
Single Plane A2C EF: 56.3 %
Single Plane A4C EF: 49.9 %
Weight: 4588.81 oz

## 2021-06-15 LAB — CBC
HCT: 45.4 % (ref 39.0–52.0)
Hemoglobin: 14.8 g/dL (ref 13.0–17.0)
MCH: 26.6 pg (ref 26.0–34.0)
MCHC: 32.6 g/dL (ref 30.0–36.0)
MCV: 81.7 fL (ref 80.0–100.0)
Platelets: 268 10*3/uL (ref 150–400)
RBC: 5.56 MIL/uL (ref 4.22–5.81)
RDW: 13.3 % (ref 11.5–15.5)
WBC: 11.8 10*3/uL — ABNORMAL HIGH (ref 4.0–10.5)
nRBC: 0 % (ref 0.0–0.2)

## 2021-06-15 LAB — CREATININE, SERUM
Creatinine, Ser: 1.27 mg/dL — ABNORMAL HIGH (ref 0.61–1.24)
GFR, Estimated: >60 mL/min (ref >60)

## 2021-06-15 LAB — ABO/RH: ABO/RH(D): A POS

## 2021-06-15 LAB — TYPE AND SCREEN
ABO/RH(D): A POS
Antibody Screen: NEGATIVE

## 2021-06-15 SURGERY — XI ROBOTIC GASTRIC SLEEVE RESECTION
Anesthesia: General | Site: Abdomen

## 2021-06-15 MED ORDER — ENSURE MAX PROTEIN PO LIQD
2.0000 [oz_av] | ORAL | Status: DC
  Administered 2021-06-16 (×5): 2 [oz_av] via ORAL
  Filled 2021-06-15 (×8): qty 330

## 2021-06-15 MED ORDER — ACETAMINOPHEN 500 MG PO TABS
1000.0000 mg | ORAL_TABLET | ORAL | Status: AC
  Administered 2021-06-15: 1000 mg via ORAL
  Filled 2021-06-15: qty 2

## 2021-06-15 MED ORDER — ENOXAPARIN (LOVENOX) PATIENT EDUCATION KIT
PACK | Freq: Once | Status: AC
  Filled 2021-06-15: qty 1

## 2021-06-15 MED ORDER — ONDANSETRON HCL 4 MG/2ML IJ SOLN
4.0000 mg | INTRAMUSCULAR | Status: DC | PRN
  Administered 2021-06-15: 4 mg via INTRAVENOUS
  Filled 2021-06-15: qty 2

## 2021-06-15 MED ORDER — KCL IN DEXTROSE-NACL 20-5-0.45 MEQ/L-%-% IV SOLN
INTRAVENOUS | Status: DC
  Filled 2021-06-15 (×3): qty 1000

## 2021-06-15 MED ORDER — OXYCODONE HCL 5 MG/5ML PO SOLN: 5.0000 mg | Freq: Four times a day (QID) | ORAL | Status: DC | PRN

## 2021-06-15 MED ORDER — EPHEDRINE 5 MG/ML INJ
INTRAVENOUS | Status: AC
  Filled 2021-06-15: qty 5

## 2021-06-15 MED ORDER — LIDOCAINE 2% (20 MG/ML) 5 ML SYRINGE
INTRAMUSCULAR | Status: DC | PRN
  Administered 2021-06-15: 100 mg via INTRAVENOUS

## 2021-06-15 MED ORDER — DEXAMETHASONE SODIUM PHOSPHATE 10 MG/ML IJ SOLN
INTRAMUSCULAR | Status: DC | PRN
Start: 2021-06-15 — End: 2021-06-15
  Administered 2021-06-15: 10 mg via INTRAVENOUS

## 2021-06-15 MED ORDER — 0.9 % SODIUM CHLORIDE (POUR BTL) OPTIME
TOPICAL | Status: DC | PRN
  Administered 2021-06-15: 1000 mL

## 2021-06-15 MED ORDER — LACTATED RINGERS IV SOLN: INTRAVENOUS | Status: DC

## 2021-06-15 MED ORDER — CHLORHEXIDINE GLUCONATE CLOTH 2 % EX PADS: 6.0000 | MEDICATED_PAD | Freq: Once | CUTANEOUS | Status: DC

## 2021-06-15 MED ORDER — ALBUTEROL SULFATE HFA 108 (90 BASE) MCG/ACT IN AERS
INHALATION_SPRAY | RESPIRATORY_TRACT | Status: DC | PRN
  Administered 2021-06-15: 7 via RESPIRATORY_TRACT

## 2021-06-15 MED ORDER — ALBUTEROL SULFATE HFA 108 (90 BASE) MCG/ACT IN AERS
INHALATION_SPRAY | RESPIRATORY_TRACT | Status: AC
  Filled 2021-06-15: qty 6.7

## 2021-06-15 MED ORDER — EPHEDRINE SULFATE-NACL 50-0.9 MG/10ML-% IV SOSY
PREFILLED_SYRINGE | INTRAVENOUS | Status: DC | PRN
  Administered 2021-06-15: 10 mg via INTRAVENOUS

## 2021-06-15 MED ORDER — LIDOCAINE 2% (20 MG/ML) 5 ML SYRINGE
INTRAMUSCULAR | Status: DC | PRN
  Administered 2021-06-15: 1.5 mg/kg/h via INTRAVENOUS

## 2021-06-15 MED ORDER — ACETAMINOPHEN 500 MG PO TABS
1000.0000 mg | ORAL_TABLET | Freq: Three times a day (TID) | ORAL | Status: DC
  Administered 2021-06-15 – 2021-06-16 (×3): 1000 mg via ORAL
  Filled 2021-06-15 (×3): qty 2

## 2021-06-15 MED ORDER — SODIUM CHLORIDE (PF) 0.9 % IJ SOLN
INTRAMUSCULAR | Status: AC
  Filled 2021-06-15: qty 10

## 2021-06-15 MED ORDER — BUPIVACAINE LIPOSOME 1.3 % IJ SUSP
INTRAMUSCULAR | Status: AC
  Filled 2021-06-15: qty 20

## 2021-06-15 MED ORDER — ALBUTEROL SULFATE (2.5 MG/3ML) 0.083% IN NEBU: 3.0000 mL | INHALATION_SOLUTION | RESPIRATORY_TRACT | Status: DC | PRN

## 2021-06-15 MED ORDER — FENTANYL CITRATE (PF) 250 MCG/5ML IJ SOLN
INTRAMUSCULAR | Status: DC | PRN
  Administered 2021-06-15: 100 ug via INTRAVENOUS
  Administered 2021-06-15 (×3): 50 ug via INTRAVENOUS

## 2021-06-15 MED ORDER — STERILE WATER FOR IRRIGATION IR SOLN
Status: DC | PRN
  Administered 2021-06-15: 1000 mL

## 2021-06-15 MED ORDER — METOPROLOL TARTRATE 5 MG/5ML IV SOLN: 5.0000 mg | Freq: Four times a day (QID) | INTRAVENOUS | Status: DC | PRN

## 2021-06-15 MED ORDER — FENTANYL CITRATE (PF) 250 MCG/5ML IJ SOLN
INTRAMUSCULAR | Status: AC
  Filled 2021-06-15: qty 5

## 2021-06-15 MED ORDER — BUPIVACAINE LIPOSOME 1.3 % IJ SUSP
20.0000 mL | Freq: Once | INTRAMUSCULAR | Status: AC
  Administered 2021-06-15: 20 mL

## 2021-06-15 MED ORDER — SUGAMMADEX SODIUM 500 MG/5ML IV SOLN
INTRAVENOUS | Status: AC
  Filled 2021-06-15: qty 5

## 2021-06-15 MED ORDER — ROCURONIUM BROMIDE 10 MG/ML (PF) SYRINGE
PREFILLED_SYRINGE | INTRAVENOUS | Status: DC | PRN
  Administered 2021-06-15: 10 mg via INTRAVENOUS
  Administered 2021-06-15 (×2): 20 mg via INTRAVENOUS
  Administered 2021-06-15: 80 mg via INTRAVENOUS
  Administered 2021-06-15: 20 mg via INTRAVENOUS

## 2021-06-15 MED ORDER — LACTATED RINGERS IR SOLN
Status: DC | PRN
  Administered 2021-06-15: 1000 mL

## 2021-06-15 MED ORDER — ACETAMINOPHEN 160 MG/5ML PO SOLN
1000.0000 mg | Freq: Three times a day (TID) | ORAL | Status: DC
  Filled 2021-06-15: qty 40.6

## 2021-06-15 MED ORDER — ONDANSETRON HCL 4 MG/2ML IJ SOLN
INTRAMUSCULAR | Status: DC | PRN
  Administered 2021-06-15: 4 mg via INTRAVENOUS

## 2021-06-15 MED ORDER — ACETAMINOPHEN 500 MG PO TABS: 1000.0000 mg | ORAL_TABLET | Freq: Once | ORAL | Status: DC

## 2021-06-15 MED ORDER — LIP MEDEX EX OINT
TOPICAL_OINTMENT | CUTANEOUS | Status: AC
  Filled 2021-06-15: qty 7

## 2021-06-15 MED ORDER — SODIUM CHLORIDE 0.9 % IV SOLN
2.0000 g | INTRAVENOUS | Status: AC
  Administered 2021-06-15: 2 g via INTRAVENOUS
  Filled 2021-06-15: qty 2

## 2021-06-15 MED ORDER — APREPITANT 40 MG PO CAPS
40.0000 mg | ORAL_CAPSULE | ORAL | Status: AC
  Administered 2021-06-15: 40 mg via ORAL
  Filled 2021-06-15: qty 1

## 2021-06-15 MED ORDER — LIDOCAINE HCL (PF) 2 % IJ SOLN
INTRAMUSCULAR | Status: AC
  Filled 2021-06-15: qty 10

## 2021-06-15 MED ORDER — HEPARIN SODIUM (PORCINE) 5000 UNIT/ML IJ SOLN
5000.0000 [IU] | Freq: Three times a day (TID) | INTRAMUSCULAR | Status: DC
  Administered 2021-06-16 (×2): 5000 [IU] via SUBCUTANEOUS
  Filled 2021-06-15 (×2): qty 1

## 2021-06-15 MED ORDER — LIDOCAINE HCL (PF) 2 % IJ SOLN
INTRAMUSCULAR | Status: AC
  Filled 2021-06-15: qty 5

## 2021-06-15 MED ORDER — MIDAZOLAM HCL 2 MG/2ML IJ SOLN
INTRAMUSCULAR | Status: AC
  Filled 2021-06-15: qty 2

## 2021-06-15 MED ORDER — PROPOFOL 10 MG/ML IV BOLUS
INTRAVENOUS | Status: DC | PRN
  Administered 2021-06-15: 150 mg via INTRAVENOUS

## 2021-06-15 MED ORDER — SCOPOLAMINE 1 MG/3DAYS TD PT72
1.0000 | MEDICATED_PATCH | TRANSDERMAL | Status: DC
  Administered 2021-06-15: 1.5 mg via TRANSDERMAL
  Filled 2021-06-15: qty 1

## 2021-06-15 MED ORDER — FENTANYL CITRATE PF 50 MCG/ML IJ SOSY
25.0000 ug | PREFILLED_SYRINGE | INTRAMUSCULAR | Status: DC | PRN
  Administered 2021-06-15: 50 ug via INTRAVENOUS

## 2021-06-15 MED ORDER — SUGAMMADEX SODIUM 500 MG/5ML IV SOLN
INTRAVENOUS | Status: DC | PRN
  Administered 2021-06-15: 300 mg via INTRAVENOUS

## 2021-06-15 MED ORDER — ROCURONIUM BROMIDE 10 MG/ML (PF) SYRINGE
PREFILLED_SYRINGE | INTRAVENOUS | Status: AC
  Filled 2021-06-15: qty 10

## 2021-06-15 MED ORDER — HEPARIN SODIUM (PORCINE) 5000 UNIT/ML IJ SOLN
5000.0000 [IU] | INTRAMUSCULAR | Status: AC
  Administered 2021-06-15: 5000 [IU] via SUBCUTANEOUS
  Filled 2021-06-15: qty 1

## 2021-06-15 MED ORDER — FENTANYL CITRATE PF 50 MCG/ML IJ SOSY
PREFILLED_SYRINGE | INTRAMUSCULAR | Status: AC
  Filled 2021-06-15: qty 1

## 2021-06-15 MED ORDER — MIDAZOLAM HCL 2 MG/2ML IJ SOLN
INTRAMUSCULAR | Status: DC | PRN
  Administered 2021-06-15: 2 mg via INTRAVENOUS

## 2021-06-15 MED ORDER — PROPOFOL 10 MG/ML IV BOLUS
INTRAVENOUS | Status: AC
  Filled 2021-06-15: qty 20

## 2021-06-15 MED ORDER — SODIUM CHLORIDE (PF) 0.9 % IJ SOLN
INTRAMUSCULAR | Status: DC | PRN
  Administered 2021-06-15: 10 mL

## 2021-06-15 MED ORDER — PANTOPRAZOLE SODIUM 40 MG IV SOLR
40.0000 mg | Freq: Every day | INTRAVENOUS | Status: DC
  Administered 2021-06-15: 40 mg via INTRAVENOUS
  Filled 2021-06-15: qty 40

## 2021-06-15 MED ORDER — DEXAMETHASONE SODIUM PHOSPHATE 10 MG/ML IJ SOLN
INTRAMUSCULAR | Status: AC
  Filled 2021-06-15: qty 1

## 2021-06-15 MED ORDER — MORPHINE SULFATE (PF) 2 MG/ML IV SOLN: 1.0000 mg | INTRAVENOUS | Status: DC | PRN

## 2021-06-15 MED ORDER — ONDANSETRON HCL 4 MG/2ML IJ SOLN
INTRAMUSCULAR | Status: AC
  Filled 2021-06-15: qty 2

## 2021-06-15 SURGICAL SUPPLY — 64 items
APPLIER CLIP 5 13 M/L LIGAMAX5 (MISCELLANEOUS)
APPLIER CLIP ROT 10 11.4 M/L (STAPLE)
BLADE SURG 15 STRL LF DISP TIS (BLADE) ×2 IMPLANT
BLADE SURG 15 STRL SS (BLADE) ×1
CANNULA REDUC XI 12-8 STAPL (CANNULA) ×1
CANNULA REDUCER 12-8 DVNC XI (CANNULA) ×2 IMPLANT
CHLORAPREP W/TINT 26 (MISCELLANEOUS) ×3 IMPLANT
CLIP APPLIE 5 13 M/L LIGAMAX5 (MISCELLANEOUS) IMPLANT
CLIP APPLIE ROT 10 11.4 M/L (STAPLE) IMPLANT
COVER SURGICAL LIGHT HANDLE (MISCELLANEOUS) ×3 IMPLANT
DECANTER SPIKE VIAL GLASS SM (MISCELLANEOUS) ×3 IMPLANT
DERMABOND ADVANCED (GAUZE/BANDAGES/DRESSINGS) ×1
DERMABOND ADVANCED .7 DNX12 (GAUZE/BANDAGES/DRESSINGS) ×2 IMPLANT
DRAPE ARM DVNC X/XI (DISPOSABLE) ×8 IMPLANT
DRAPE COLUMN DVNC XI (DISPOSABLE) ×2 IMPLANT
DRAPE DA VINCI XI ARM (DISPOSABLE) ×4
DRAPE DA VINCI XI COLUMN (DISPOSABLE) ×1
ELECT REM PT RETURN 15FT ADLT (MISCELLANEOUS) ×3 IMPLANT
GLOVE SURG ENC MOIS LTX SZ8 (GLOVE) ×6 IMPLANT
GOWN STRL REUS W/TWL XL LVL3 (GOWN DISPOSABLE) ×9 IMPLANT
GRASPER SUT TROCAR 14GX15 (MISCELLANEOUS) ×3 IMPLANT
IRRIG SUCT STRYKERFLOW 2 WTIP (MISCELLANEOUS) ×3
IRRIGATION SUCT STRKRFLW 2 WTP (MISCELLANEOUS) ×2 IMPLANT
KIT BASIN OR (CUSTOM PROCEDURE TRAY) ×3 IMPLANT
KIT TURNOVER KIT A (KITS) ×3 IMPLANT
LUBRICANT JELLY K Y 4OZ (MISCELLANEOUS) IMPLANT
MARKER SKIN DUAL TIP RULER LAB (MISCELLANEOUS) ×3 IMPLANT
MAT PREVALON FULL STRYKER (MISCELLANEOUS) ×3 IMPLANT
NEEDLE SPNL 22GX3.5 QUINCKE BK (NEEDLE) ×3 IMPLANT
OBTURATOR OPTICAL STANDARD 8MM (TROCAR) ×1
OBTURATOR OPTICAL STND 8 DVNC (TROCAR) ×2
OBTURATOR OPTICALSTD 8 DVNC (TROCAR) ×2 IMPLANT
PACK CARDIOVASCULAR III (CUSTOM PROCEDURE TRAY) ×3 IMPLANT
RELOAD STAPLER 2.5X60 WHT DVNC (STAPLE) ×10 IMPLANT
RELOAD STAPLER 3.5X60 BLU DVNC (STAPLE) ×4 IMPLANT
SCISSORS LAP 5X35 DISP (ENDOMECHANICALS) IMPLANT
SEAL CANN UNIV 5-8 DVNC XI (MISCELLANEOUS) ×6 IMPLANT
SEAL XI 5MM-8MM UNIVERSAL (MISCELLANEOUS) ×3
SEALER VESSEL DA VINCI XI (MISCELLANEOUS) ×1
SEALER VESSEL EXT DVNC XI (MISCELLANEOUS) ×2 IMPLANT
SLEEVE GASTRECTOMY 36FR VISIGI (MISCELLANEOUS) ×3 IMPLANT
SOL ANTI FOG 6CC (MISCELLANEOUS) ×2 IMPLANT
SOLUTION ANTI FOG 6CC (MISCELLANEOUS) ×1
SOLUTION ELECTROLUBE (MISCELLANEOUS) ×3 IMPLANT
SPONGE T-LAP 18X18 ~~LOC~~+RFID (SPONGE) ×3 IMPLANT
STAPLER 60 DA VINCI SURE FORM (STAPLE) ×1
STAPLER 60 SUREFORM DVNC (STAPLE) ×2 IMPLANT
STAPLER CANNULA SEAL DVNC XI (STAPLE) ×2 IMPLANT
STAPLER CANNULA SEAL XI (STAPLE) ×1
STAPLER RELOAD 2.5X60 WHITE (STAPLE) ×5
STAPLER RELOAD 2.5X60 WHT DVNC (STAPLE) ×10
STAPLER RELOAD 3.5X60 BLU DVNC (STAPLE) ×4
STAPLER RELOAD 3.5X60 BLUE (STAPLE) ×2
SUT ETHIBOND 0 36 GRN (SUTURE) IMPLANT
SUT MNCRL AB 4-0 PS2 18 (SUTURE) ×6 IMPLANT
SUT VICRYL 0 TIES 12 18 (SUTURE) ×3 IMPLANT
SYR 10ML ECCENTRIC (SYRINGE) IMPLANT
SYR 20ML LL LF (SYRINGE) ×3 IMPLANT
TOWEL OR 17X26 10 PK STRL BLUE (TOWEL DISPOSABLE) ×3 IMPLANT
TRAY FOLEY MTR SLVR 16FR STAT (SET/KITS/TRAYS/PACK) IMPLANT
TROCAR ADV FIXATION 5X100MM (TROCAR) IMPLANT
TROCAR BLADELESS OPT 5 100 (ENDOMECHANICALS) ×3 IMPLANT
TUBE CALIBRATION LAPBAND (TUBING) IMPLANT
TUBING INSUFFLATION 10FT LAP (TUBING) ×3 IMPLANT

## 2021-06-15 NOTE — Progress Notes (Signed)
PHARMACY CONSULT FOR:  Risk Assessment for Post-Discharge VTE Following Bariatric Surgery  Post-Discharge VTE Risk Assessment: This patient's probability of 30-day post-discharge VTE is increased due to the factors marked: X Male    Age >/=60 years    BMI >/=50 kg/m2    CHF    Dyspnea at Rest    Paraplegia  X  Non-gastric-band surgery    Operation Time >/=3 hr    Return to OR     Length of Stay >/= 3 d  X Hx of VTE   Hypercoagulable condition   Significant venous stasis   Predicted probability of 30-day post-discharge VTE: N/A  Other patient-specific factors to consider: PE on 10/22/2020: positive for bilateral pulmonary emboli with a large clot burden and findings consistent with right heart strain. Treated with Eliquis until 04/18/2021.  Recommendation for Discharge: Enoxaparin 40 mg [place] q12h x 4 weeks post-discharge  Christopher Burns is a 50 y.o. male who underwent robotic sleeve gastrectomy and upper endoscopy on 06/15/2021.   Allergies  Allergen Reactions   Other Itching and Rash    Pt was tested and positive for feline, canine and cockroaches. Pt has rash, itching, watery eyes. And is on allergy injections since 2017   Patient Measurements: Height: '5\' 11"'  (180.3 cm) Weight: 130.1 kg (286 lb 12.8 oz) IBW/kg (Calculated) : 75.3 Body mass index is 40 kg/m.  No results for input(s): WBC, HGB, HCT, PLT, APTT, CREATININE, LABCREA, CREATININE, CREAT24HRUR, MG, PHOS, ALBUMIN, PROT, ALBUMIN, AST, ALT, ALKPHOS, BILITOT, BILIDIR, IBILI in the last 72 hours. Estimated Creatinine Clearance: 139.7 mL/min (by C-G formula based on SCr of 0.87 mg/dL).  Past Medical History:  Diagnosis Date   Asthma    Dizziness    Family history of BRCA1 gene positive    Family history of breast cancer    Family history of ovarian cancer    Family history of prostate cancer    GERD (gastroesophageal reflux disease)    Hemorrhoid    High blood pressure    High cholesterol    Hx of blood clots     Hypercholesteremia    Polycythemia    Pre-diabetes    Sleep apnea    cpap   Medications Prior to Admission  Medication Sig Dispense Refill Last Dose   cetirizine (ZYRTEC) 10 MG tablet Take 10 mg by mouth daily.   Past Week   cholecalciferol (VITAMIN D) 25 MCG (1000 UNIT) tablet Take 1,000 Units by mouth daily.   Past Week   EPINEPHrine 0.3 mg/0.3 mL IJ SOAJ injection Inject 0.3 mg into the muscle as needed for anaphylaxis.      esomeprazole (NEXIUM) 40 MG capsule Take 40 mg by mouth at bedtime.   06/14/2021   lisinopril-hydrochlorothiazide (ZESTORETIC) 20-12.5 MG tablet Take 1 tablet by mouth daily.   Past Week   montelukast (SINGULAIR) 10 MG tablet Take 10 mg by mouth at bedtime.   11 06/14/2021   Multiple Vitamins-Minerals (MULTIVITAMIN WITH MINERALS) tablet Take 1 tablet by mouth daily.   06/14/2021   NON FORMULARY Pt uses a cpap nightly   06/14/2021   omega-3 acid ethyl esters (LOVAZA) 1 G capsule Take 1 g by mouth daily.   06/14/2021   polyvinyl alcohol (LIQUIFILM TEARS) 1.4 % ophthalmic solution Place 1 drop into both eyes as needed for dry eyes.   Past Week   simvastatin (ZOCOR) 10 MG tablet TAKE 1 TABLET BY MOUTH EVERYDAY AT BEDTIME 30 tablet 3 06/14/2021   vitamin B-12 (CYANOCOBALAMIN) 100  MCG tablet Take 100 mcg by mouth daily.   06/14/2021   zinc gluconate 50 MG tablet Take 50 mg by mouth daily.   06/14/2021   albuterol (VENTOLIN HFA) 108 (90 Base) MCG/ACT inhaler Inhale 2 puffs into the lungs every 4 (four) hours as needed for wheezing or shortness of breath.   More than a month   aspirin EC 81 MG tablet Take 81 mg by mouth daily. Swallow whole.   More than a month    Kayli Beal A Myrtie Leuthold 06/15/2021,11:54 AM

## 2021-06-15 NOTE — Progress Notes (Signed)

## 2021-06-15 NOTE — Interval H&P Note (Signed)
History and Physical Interval Note:  06/15/2021 7:23 AM  Christopher Burns  has presented today for surgery, with the diagnosis of morbid obesity.  The various methods of treatment have been discussed with the patient and family. After consideration of risks, benefits and other options for treatment, the patient has consented to  Procedure(s): XI ROBOTIC GASTRIC SLEEVE RESECTION (N/A) UPPER GI ENDOSCOPY (N/A) as a surgical intervention.  The patient's history has been reviewed, patient examined, no change in status, stable for surgery.  I have reviewed the patient's chart and labs.  Questions were answered to the patient's satisfaction.     Valarie Merino

## 2021-06-15 NOTE — Transfer of Care (Signed)
Immediate Anesthesia Transfer of Care Note  Patient: Christopher Burns  Procedure(s) Performed: XI ROBOTIC GASTRIC SLEEVE RESECTION (Abdomen) UPPER GI ENDOSCOPY  Patient Location: PACU  Anesthesia Type:General  Level of Consciousness: awake  Airway & Oxygen Therapy: Patient Spontanous Breathing and Patient connected to face mask oxygen  Post-op Assessment: Report given to RN and Post -op Vital signs reviewed and stable  Post vital signs: Reviewed and stable  Last Vitals:  Vitals Value Taken Time  BP 146/79 06/15/21 1048  Temp    Pulse 93 06/15/21 1049  Resp 21 06/15/21 1049  SpO2 100 % 06/15/21 1049  Vitals shown include unvalidated device data.  Last Pain:  Vitals:   06/15/21 0658  TempSrc:   PainSc: 0-No pain         Complications: No notable events documented.

## 2021-06-15 NOTE — Op Note (Signed)
   Surgeon: Wenda Low, MD, FACS  Asst:  Ivar Drape, MD 15 June 2021 Anes:  General endotracheal  Procedure: Robotic sleeve gastrectomy and upper endoscopy  Diagnosis: Morbid obesity  Complications:  Bundle branch block  EBL:   minimal cc  Description of Procedure:  The patient was take to OR 2 and given general anesthesia.  The abdomen was prepped with Chloroprep and draped sterilely.  A timeout was performed.  Access to the abdomen was achieved with a 5 mm Optiview through the left upper quadrant.  Following insufflation, the state of the abdomen was found to be free of adhesions.  The ViSiGi 36Fr tube was inserted to deflate the stomach and was pulled back into the esophagus.  Four trocars were placed including a 12 mm for the robotic stapler.    The pylorus was identified and we measured 6 cm back and marked the antrum.  At that point we began dissection to take down the greater curvature of the stomach using the vessel sealer.  This dissection was taken all the way up to the left crus.  Posterior attachments of the stomach were also taken down.    The ViSiGi tube was then passed into the antrum and suction applied so that it was snug along the lessor curvature.  The "crow's foot" or incisura was identified.  The sleeve gastrectomy was begun using the Sureform platform stapler beginning with a two blue loads and then white loads.  When the sleeve was complete the tube was taken off suction and insufflated briefly.  The tube was withdrawn.  Upper endoscopy was then performed by Dr. Daphine Deutscher and a cylindrical tube was seen without bubbles.     The specimen was extracted through the 12 mm trocar site\ which was closed with a 0 vicryl with the PRI.    Local block was provided by infiltrating abdomen as a TAP block with Exparel and then closed 4-0 Monocryl and Dermabond.    He had a couple of runs of bundle branch block during the case and was otherwise stable.    Matt B.  Daphine Deutscher, MD, [facility], [place] [phone]

## 2021-06-15 NOTE — Anesthesia Procedure Notes (Signed)
Procedure Name: Intubation Date/Time: 06/15/2021 8:03 AM Performed by: Florene Route, CRNA Pre-anesthesia Checklist: Patient identified, Emergency Drugs available, Suction available and Patient being monitored Patient Re-evaluated:Patient Re-evaluated prior to induction Oxygen Delivery Method: Circle system utilized Preoxygenation: Pre-oxygenation with 100% oxygen Induction Type: IV induction Ventilation: Mask ventilation without difficulty and Oral airway inserted - appropriate to patient size Laryngoscope Size: Hyacinth Meeker and 3 Grade View: Grade I Tube type: Oral Tube size: 8.0 mm Number of attempts: 1 Airway Equipment and Method: Stylet Placement Confirmation: ETT inserted through vocal cords under direct vision, positive ETCO2 and breath sounds checked- equal and bilateral Secured at: 22 cm Tube secured with: Tape Dental Injury: Teeth and Oropharynx as per pre-operative assessment

## 2021-06-15 NOTE — Consult Note (Signed)
Cardiology Consultation:   Patient ID: Christopher Burns MRN: [id number]; DOB: 1971/07/18  Admit date: 06/15/2021 Date of Consult: 06/15/2021  PCP:  Christopher Athens, MD   [facility] Providers Cardiologist:  None        Patient Profile:   Christopher Burns is a 50 y.o. male with a hx of bilateral pulmonary embolus 10/2020 with thrombectomy and taken off eliquis in Sept.  Also hx of polycythemia-testosterone induced, HLD, OSA wit CPAP,  who is being seen 06/15/2021 for the evaluation of BBB in OR with gastric sleeve at the request of Christopher Burns.  History of Present Illness:   Christopher Burns with above hx and neg hypercoagulable work up including protein C protein/Antithrombin III levels, factor V Leiden, prothrombin gene mutation and antiphospholipid antibody panel were all negative.  Dr. Janese Burns oncology stopped the eliquis in Sept.  With PE he did have RV strain.  Follow up echo 04/28/21 with EF 55-60% normal RV size and function. Improved from 10/2020.  In OR today for  Robotic sleeve gastrectomy and upper endoscopy- he had runs of BBB during the case.  Most recent vital signs with BP 141/84, pulse 74, SPO2 97% on room air.  Labs from 10/17 showed creatinine 0.87, sodium 136, potassium 4.1, hemoglobin 15.1, platelets 278, WBC 6.8, A1c 6.2%.  EKG shows normal sinus rhythm, rate 90, left bundle branch block.  Echo 04/28/21 showed EF 55 to 60%, normal RV function, no significant valvular disease.  He reports that he is active, walks regularly and lifts weights.  Denies any chest pain or dyspnea.   Past Medical History:  Diagnosis Date   Asthma    Dizziness    Family history of BRCA1 gene positive    Family history of breast cancer    Family history of ovarian cancer    Family history of prostate cancer    GERD (gastroesophageal reflux disease)    Hemorrhoid    High blood pressure    High cholesterol    Hx of blood clots    Hypercholesteremia    Polycythemia    Pre-diabetes    Sleep apnea     cpap    Past Surgical History:  Procedure Laterality Date   bisep Left 2007   COLONOSCOPY WITH PROPOFOL N/A 03/01/2019   Procedure: COLONOSCOPY WITH PROPOFOL;  Surgeon: Christopher Bellows, MD;  [facility];  Service: Gastroenterology;  Laterality: N/A;   KNEE SURGERY Right    LASIK     PULMONARY THROMBECTOMY Bilateral 10/23/2020   Procedure: PULMONARY THROMBECTOMY;  Surgeon: Christopher Huxley, MD;  Location: [place] [facility];  Service: Cardiovascular;  Laterality: Bilateral;   TONSILLECTOMY     UMBILICAL HERNIA REPAIR N/A 07/29/2015   Procedure: HERNIA REPAIR UMBILICAL ADULT;  Surgeon: Christopher Lye, MD;  [facility]: [facility];  Service: General;  Laterality: N/A;   VASOTOMY       Home Medications:  Prior to Admission medications   Medication Sig Start Date End Date Taking? Authorizing Provider  cetirizine (ZYRTEC) 10 MG tablet Take 10 mg by mouth daily.   Yes [provider]  cholecalciferol (VITAMIN D) 25 MCG (1000 UNIT) tablet Take 1,000 Units by mouth daily.   Yes [provider]  EPINEPHrine 0.3 mg/0.3 mL IJ SOAJ injection Inject 0.3 mg into the muscle as needed for anaphylaxis. 02/03/21  Yes [provider]  esomeprazole (NEXIUM) 40 MG capsule Take 40 mg by mouth at bedtime.   Yes [provider]  lisinopril-hydrochlorothiazide (ZESTORETIC) 20-12.5 MG  tablet Take 1 tablet by mouth daily. 04/04/21  Yes [provider]  montelukast (SINGULAIR) 10 MG tablet Take 10 mg by mouth at bedtime.  06/12/14  Yes [provider]  Multiple Vitamins-Minerals (MULTIVITAMIN WITH MINERALS) tablet Take 1 tablet by mouth daily.   Yes [provider]  NON FORMULARY Pt uses a cpap nightly   Yes [provider]  omega-3 acid ethyl esters (LOVAZA) 1 G capsule Take 1 g by mouth daily.   Yes [provider]  polyvinyl alcohol (LIQUIFILM TEARS) 1.4 % ophthalmic solution Place 1 drop into both eyes as needed for dry eyes.    Yes [provider]  simvastatin (ZOCOR) 10 MG tablet TAKE 1 TABLET BY MOUTH EVERYDAY AT BEDTIME 03/08/21  Yes [facility], Christopher Shove, MD  vitamin B-12 (CYANOCOBALAMIN) 100 MCG tablet Take 100 mcg by mouth daily.   Yes [provider]  zinc gluconate 50 MG tablet Take 50 mg by mouth daily.   Yes [provider]  albuterol (VENTOLIN HFA) 108 (90 Base) MCG/ACT inhaler Inhale 2 puffs into the lungs every 4 (four) hours as needed for wheezing or shortness of breath. 02/03/21   [provider]  aspirin EC 81 MG tablet Take 81 mg by mouth daily. Swallow whole.    [provider]    Inpatient Medications: Scheduled Meds:  acetaminophen  1,000 mg Oral Q8H   Or   acetaminophen (TYLENOL) oral liquid 160 mg/5 mL  1,000 mg Oral Q8H   fentaNYL       [START ON 06/16/2021] heparin injection (subcutaneous)  5,000 Units Subcutaneous Q8H   lip balm       pantoprazole (PROTONIX) IV  40 mg Intravenous QHS   [START ON 06/16/2021] Ensure Max Protein  2 oz Oral Q2H   Continuous Infusions:  dextrose 5 % and 0.45 % NaCl with KCl 20 mEq/L 50 mL/hr at 06/15/21 1257   PRN Meds: albuterol, metoprolol tartrate, morphine injection, ondansetron (ZOFRAN) IV, oxyCODONE  Allergies:    Allergies  Allergen Reactions   Other Itching and Rash    Pt was tested and positive for feline, canine and cockroaches. Pt has rash, itching, watery eyes. And is on allergy injections since 2017    Social History:   Social History   Socioeconomic History   Marital status: Married    Spouse name: Christopher Burns   Number of children: 1   Years of education: 13   Highest education level: Not on file  Occupational History   Occupation: work full time    Comment: Christopher [employer]  Tobacco Use   Smoking status: Never   Smokeless tobacco: Never  Vaping Use   Vaping Use: Never used  Substance and Sexual Activity   Alcohol use: Never   Drug use: Never   Sexual activity: Yes  Other Topics Concern    Not on file  Social History Narrative   Patient lives at home  with his wife Christopher Burns).  Patient works full time for [employer].   Education some college.   Right handed.   Caffeine one shot of colombian  coffee.   Social Determinants of Health   Financial Resource Strain: Not on file  Food Insecurity: Not on file  Transportation Needs: Not on file  Physical Activity: Not on file  Stress: Not on file  Social Connections: Not on file  Intimate Partner Violence: Not on file    Family History:    Family History  Problem Relation Age  of Onset   High Cholesterol Mother    Breast cancer Mother 68   Diabetes Father    High blood pressure Father    High Cholesterol Father    Prostate cancer Paternal Grandfather    Cancer Maternal Grandfather        possible stomach cancer   Cancer Maternal Aunt        possible pancreatic cancer   Esophageal cancer Maternal Uncle    Breast cancer Cousin    Ovarian cancer Cousin      ROS:  Please see the history of present illness.  General:no colds or fevers, no weight changes Skin:no rashes or ulcers HEENT:no blurred vision, no congestion CV:see HPI PUL:see HPI GI:no diarrhea constipation or melena, no indigestion GU:no hematuria, no dysuria MS:no joint pain, no claudication Neuro:no syncope, no lightheadedness Endo:no diabetes, no thyroid disease  All other ROS reviewed and negative.     Physical Exam/Data:   Vitals:   06/15/21 1145 06/15/21 1200 06/15/21 1215 06/15/21 1243  BP: 136/79 137/82 (!) 147/76 (!) 141/84  Pulse: 95 93 85 74  Resp: _0 Temp:      TempSrc:      SpO2: 96% 96% 96% 97%  Weight:      Height:        Intake/Output Summary (Last 24 hours) at 06/15/2021 1349 Last data filed at 06/15/2021 1050 Gross per 24 hour  Intake 950 ml  Output 25 ml  Net 925 ml   Last 3 Weights 06/15/2021 06/15/2021 05/31/2021  Weight (lbs) 286 lb 12.8 oz 286 lb 12.8 oz 295 lb  Weight (kg) 130.092 kg 130.092 kg  133.811 kg     Body mass index is 40 kg/m.  General:  Somnolent but arousable, in no acute distress HEENT: normal Neck: no JVD appreciated  Cardiac:  normal S1, S2; RRR; no murmur  Lungs:  clear to auscultation bilaterally, no wheezing, rhonchi or rales  Abd: soft Ext: no edema Musculoskeletal:  No deformities, BUE and BLE strength normal and equal Skin: warm and dry  Neuro:  no focal abnormalities noted Psych:  Normal affect   EKG:  The EKG was personally reviewed and demonstrates: Normal sinus rhythm, rate 90, left bundle branch block Telemetry:  Telemetry was personally reviewed and demonstrates: Not on telemetry  Relevant CV Studies: Echo 04/28/21 IMPRESSIONS     1. Left ventricular ejection fraction, by estimation, is 55 to 60%. The  left ventricle has normal function. The left ventricle has no regional  wall motion abnormalities. Left ventricular diastolic parameters were  normal. The average left ventricular  global longitudinal strain is -17.2 %. The global longitudinal strain is  normal.   2. Right ventricular systolic function is normal. The right ventricular  size is normal.   3. The mitral valve is normal in structure. No evidence of mitral valve  regurgitation.   4. The aortic valve is tricuspid. Aortic valve regurgitation is not  visualized.   FINDINGS   Left Ventricle: Left ventricular ejection fraction, by estimation, is 55  to 60%. The left ventricle has normal function. The left ventricle has no  regional wall motion abnormalities. The average left ventricular global  longitudinal strain is -17.2 %.  The global longitudinal strain is normal. The left ventricular internal  cavity size was normal in size. There is no left ventricular hypertrophy.  Left ventricular diastolic parameters were normal.   Right Ventricle: The right ventricular size is normal. No increase in  right  ventricular wall thickness. Right ventricular systolic function is  normal.    Left Atrium: Left atrial size was normal in size.   Right Atrium: Right atrial size was normal in size.   Pericardium: There is no evidence of pericardial effusion.   Mitral Valve: The mitral valve is normal in structure. No evidence of  mitral valve regurgitation. MV peak gradient, 2.3 mmHg. The mean mitral  valve gradient is 1.0 mmHg.   Tricuspid Valve: The tricuspid valve is normal in structure. Tricuspid  valve regurgitation is not demonstrated.   Aortic Valve: The aortic valve is tricuspid. Aortic valve regurgitation is  not visualized. Aortic valve mean gradient measures 4.0 mmHg. Aortic valve  peak gradient measures 7.8 mmHg. Aortic valve area, by VTI measures 2.89  cm.   Pulmonic Valve: The pulmonic valve was normal in structure. Pulmonic valve  regurgitation is not visualized.   Aorta: The aortic root is normal in size and structure.   Venous: The inferior vena cava was not well visualized.   IAS/Shunts: No atrial level shunt detected by color flow Doppler.       Laboratory Data:  High Sensitivity Troponin:  No results for input(s): TROPONINIHS in the last 720 hours.   ChemistryNo results for input(s): NA, K, CL, CO2, GLUCOSE, BUN, CREATININE, CALCIUM, MG, GFRNONAA, GFRAA, ANIONGAP in the last 168 hours.  No results for input(s): PROT, ALBUMIN, AST, ALT, ALKPHOS, BILITOT in the last 168 hours. Lipids No results for input(s): CHOL, TRIG, HDL, LABVLDL, LDLCALC, CHOLHDL in the last 168 hours.  HematologyNo results for input(s): WBC, RBC, HGB, HCT, MCV, MCH, MCHC, RDW, PLT in the last 168 hours. Thyroid No results for input(s): TSH, FREET4 in the last 168 hours.  BNPNo results for input(s): BNP, PROBNP in the last 168 hours.  DDimer No results for input(s): DDIMER in the last 168 hours.   Radiology/Studies:  No results found.   Assessment and Plan:   Left bundle branch block: Intermittent LBBB reported during gastric sleeve procedure today.  He denies any  anignal symptoms. BBB could be rate related.  Would recommend monitoring on telemetry while admitted.  We will repeat limited echo to ensure no evidence of structural heart disease.  PE: History of bilateral PE 10/2020 requiring thrombectomy.  Hypercoagulable work-up was negative.  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

## 2021-06-15 NOTE — Anesthesia Postprocedure Evaluation (Signed)
Anesthesia Post Note  Patient: Isay Perleberg  Procedure(s) Performed: XI ROBOTIC GASTRIC SLEEVE RESECTION (Abdomen) UPPER GI ENDOSCOPY     Patient location during evaluation: PACU Anesthesia Type: General Level of consciousness: awake and alert Pain management: pain level controlled Vital Signs Assessment: post-procedure vital signs reviewed and stable Respiratory status: spontaneous breathing, nonlabored ventilation, respiratory function stable and patient connected to nasal cannula oxygen Cardiovascular status: blood pressure returned to baseline and stable Postop Assessment: no apparent nausea or vomiting Anesthetic complications: no Comments: Intraoperatively, rhythm changed into what appeared to be a LBBB. H/o of LAFB, but not LBBB. Pt was otherwise hemodynamically stable. In PACU, EKG with LBBB. Remained hemodynamically stable in PACU. Plan for cardiology to evaluate patient during this admission.    No notable events documented.  Last Vitals:  Vitals:   06/15/21 1200 06/15/21 1215  BP: 137/82 (!) 147/76  Pulse: 93 85  Resp: 15 18  Temp:    SpO2: 96% 96%    Last Pain:  Vitals:   06/15/21 1215  TempSrc:   PainSc: Asleep                 Tavonte Seybold L Kymber Kosar

## 2021-06-16 ENCOUNTER — Encounter: Payer: Self-pay | Admitting: Oncology

## 2021-06-16 ENCOUNTER — Other Ambulatory Visit (HOSPITAL_COMMUNITY): Payer: Self-pay

## 2021-06-16 ENCOUNTER — Encounter (HOSPITAL_COMMUNITY): Payer: Self-pay | Admitting: Surgery

## 2021-06-16 DIAGNOSIS — I447 Left bundle-branch block, unspecified: Secondary | ICD-10-CM | POA: Diagnosis not present

## 2021-06-16 DIAGNOSIS — Z9884 Bariatric surgery status: Secondary | ICD-10-CM | POA: Diagnosis not present

## 2021-06-16 LAB — CBC WITH DIFFERENTIAL/PLATELET
Abs Immature Granulocytes: 0.06 10*3/uL (ref 0.00–0.07)
Basophils Absolute: 0 10*3/uL (ref 0.0–0.1)
Basophils Relative: 0 %
Eosinophils Absolute: 0 10*3/uL (ref 0.0–0.5)
Eosinophils Relative: 0 %
HCT: 42.6 % (ref 39.0–52.0)
Hemoglobin: 14.2 g/dL (ref 13.0–17.0)
Immature Granulocytes: 1 %
Lymphocytes Relative: 11 %
Lymphs Abs: 1.4 10*3/uL (ref 0.7–4.0)
MCH: 26.8 pg (ref 26.0–34.0)
MCHC: 33.3 g/dL (ref 30.0–36.0)
MCV: 80.5 fL (ref 80.0–100.0)
Monocytes Absolute: 1.2 10*3/uL — ABNORMAL HIGH (ref 0.1–1.0)
Monocytes Relative: 9 %
Neutro Abs: 10.4 10*3/uL — ABNORMAL HIGH (ref 1.7–7.7)
Neutrophils Relative %: 79 %
Platelets: 255 10*3/uL (ref 150–400)
RBC: 5.29 MIL/uL (ref 4.22–5.81)
RDW: 13.5 % (ref 11.5–15.5)
WBC: 13 10*3/uL — ABNORMAL HIGH (ref 4.0–10.5)
nRBC: 0 % (ref 0.0–0.2)

## 2021-06-16 LAB — SURGICAL PATHOLOGY

## 2021-06-16 MED ORDER — OXYCODONE HCL 5 MG PO TABS
5.0000 mg | ORAL_TABLET | Freq: Four times a day (QID) | ORAL | 0 refills | Status: DC | PRN
  Filled 2021-06-16: qty 10, 3d supply, fill #0

## 2021-06-16 MED ORDER — ENOXAPARIN SODIUM 40 MG/0.4ML IJ SOSY
40.0000 mg | PREFILLED_SYRINGE | Freq: Two times a day (BID) | INTRAMUSCULAR | 0 refills | Status: DC
  Filled 2021-06-16: qty 4.8, 6d supply, fill #0

## 2021-06-16 MED ORDER — ONDANSETRON 4 MG PO TBDP
4.0000 mg | ORAL_TABLET | Freq: Four times a day (QID) | ORAL | 0 refills | Status: DC | PRN
  Filled 2021-06-16: qty 20, 5d supply, fill #0

## 2021-06-16 NOTE — Progress Notes (Signed)
Patient alert and oriented, pain is controlled. Patient is tolerating fluids, advanced to protein shake today, patient is tolerating well.  Reviewed Gastric sleeve discharge instructions with patient and patient is able to articulate understanding.  Provided information on BELT program, Support Group and [facility] outpatient pharmacy. Reviewed Lovenox teaching kit and pt reviewed Lovenox "Patient-Self Injection Video".  All questions answered, will continue to monitor.  

## 2021-06-16 NOTE — Discharge Instructions (Signed)

## 2021-06-16 NOTE — Plan of Care (Signed)
  Problem: Education: Goal: Knowledge of General Education information will improve Description: Including pain rating scale, medication(s)/side effects and non-pharmacologic comfort measures Outcome: Progressing   Problem: Health Behavior/Discharge Planning: Goal: Ability to manage health-related needs will improve Outcome: Progressing   Problem: Clinical Measurements: Goal: Diagnostic test results will improve Outcome: Progressing Goal: Respiratory complications will improve Outcome: Progressing   Problem: Activity: Goal: Risk for activity intolerance will decrease Outcome: Progressing   Problem: Nutrition: Goal: Adequate nutrition will be maintained Outcome: Progressing   Problem: Coping: Goal: Level of anxiety will decrease Outcome: Progressing

## 2021-06-16 NOTE — Progress Notes (Signed)
24hr fluid recall prior to discharge: (protein shake) and clear liquid tray during lunch time.  Per dehydration protocol, will call pt to f/u within one week post op.

## 2021-06-16 NOTE — Discharge Summary (Addendum)
Physician Discharge Summary  Patient ID: [facility] MRN: [id number] DOB/AGE: 1970-11-23 50 y.o.  PCP: Corky Downs, MD  Admit date: 06/15/2021 Discharge date: 06/16/2021  Admission Diagnoses:  morbid obesity  Discharge Diagnoses:  same  Principal Problem:   S/P laparoscopic sleeve gastrectomy Active Problems:   LBBB (left bundle branch block)   Surgery:  robot Xi sleeve gastrectomy  Discharged Condition: improved; no cardiac followup recommended  Hospital Course:   had surgery on Tuesday.  Was begun on liquids which he tolerated.  He had a bundle branch block noted during surgery and had cardiology consult postop.  Cardiac ultrasound performed.  The block was monitored on telemetry and cleared back to sinus rhythm during the night.    Consults: cardiology consult with Dr. Nathaniel Man. Cardiology signed off.      Significant Diagnostic Studies: cardiac ultrasound which did not show any pathology to my review of the report.      Discharge Exam: Blood pressure (!) 168/79, pulse 86, temperature 99.7 F (37.6 C), temperature source Oral, resp. rate 20, height 5\' 11"  (1.803 m), weight 130.1 kg, SpO2 99 %. Incisions bland--He will be on Lovenox 40 BID for 30 days for DVT prophylaxis  Disposition: Discharge disposition: 01-Home or Self Care       Discharge Instructions     Ambulate hourly while awake   Complete by: As directed    Ambulate hourly while awake   Complete by: As directed    Call MD for:  difficulty breathing, headache or visual disturbances   Complete by: As directed    Call MD for:  difficulty breathing, headache or visual disturbances   Complete by: As directed    Call MD for:  persistant dizziness or light-headedness   Complete by: As directed    Call MD for:  persistant dizziness or light-headedness   Complete by: As directed    Call MD for:  persistant nausea and vomiting   Complete by: As directed    Call MD for:  persistant nausea and  vomiting   Complete by: As directed    Call MD for:  redness, tenderness, or signs of infection (pain, swelling, redness, odor or green/yellow discharge around incision site)   Complete by: As directed    Call MD for:  redness, tenderness, or signs of infection (pain, swelling, redness, odor or green/yellow discharge around incision site)   Complete by: As directed    Call MD for:  severe uncontrolled pain   Complete by: As directed    Call MD for:  severe uncontrolled pain   Complete by: As directed    Call MD for:  temperature >101 F   Complete by: As directed    Call MD for:  temperature >101 F   Complete by: As directed    Diet bariatric full liquid   Complete by: As directed    Diet bariatric full liquid   Complete by: As directed    Incentive spirometry   Complete by: As directed    Perform hourly while awake   Incentive spirometry   Complete by: As directed    Perform hourly while awake   No wound care   Complete by: As directed       Allergies as of 06/16/2021       Reactions   Other Itching, Rash   Pt was tested and positive for feline, canine and cockroaches. Pt has rash, itching, watery eyes. And is on allergy injections since 2017  Medication List     STOP taking these medications    aspirin EC 81 MG tablet   cetirizine 10 MG tablet Commonly known as: ZYRTEC       TAKE these medications    albuterol 108 (90 Base) MCG/ACT inhaler Commonly known as: VENTOLIN HFA Inhale 2 puffs into the lungs every 4 (four) hours as needed for wheezing or shortness of breath.   cholecalciferol 25 MCG (1000 UNIT) tablet Commonly known as: VITAMIN D Take 1,000 Units by mouth daily.   enoxaparin 40 MG/0.4ML injection Commonly known as: LOVENOX Inject 0.4 mLs (40 mg total) into the skin 2 (two) times daily.   EPINEPHrine 0.3 mg/0.3 mL Soaj injection Commonly known as: EPI-PEN Inject 0.3 mg into the muscle as needed for anaphylaxis.   esomeprazole 40 MG  capsule Commonly known as: NEXIUM Take 40 mg by mouth at bedtime.   lisinopril-hydrochlorothiazide 20-12.5 MG tablet Commonly known as: ZESTORETIC Take 1 tablet by mouth daily. Notes to patient: Monitor Blood Pressure Daily and keep a log for primary care physician.  You may need to make changes to your medications with rapid weight loss.     montelukast 10 MG tablet Commonly known as: SINGULAIR Take 10 mg by mouth at bedtime.   multivitamin with minerals tablet Take 1 tablet by mouth daily.   NON FORMULARY Pt uses a cpap nightly   omega-3 acid ethyl esters 1 g capsule Commonly known as: LOVAZA Take 1 g by mouth daily.   ondansetron 4 MG disintegrating tablet Commonly known as: ZOFRAN-ODT Take 1 tablet (4 mg total) by mouth every 6 (six) hours as needed for nausea or vomiting.   oxyCODONE 5 MG immediate release tablet Commonly known as: Oxy IR/ROXICODONE Take 1 tablet (5 mg total) by mouth every 6 (six) hours as needed for severe pain.   polyvinyl alcohol 1.4 % ophthalmic solution Commonly known as: LIQUIFILM TEARS Place 1 drop into both eyes as needed for dry eyes.   simvastatin 10 MG tablet Commonly known as: ZOCOR TAKE 1 TABLET BY MOUTH EVERYDAY AT BEDTIME   vitamin B-12 100 MCG tablet Commonly known as: CYANOCOBALAMIN Take 100 mcg by mouth daily.   zinc gluconate 50 MG tablet Take 50 mg by mouth daily.        Follow-up Information     [facility], [facility]. Go on 07/01/2021.   Specialty: General Surgery Why: at 9am with Dr. Daphine Deutscher. Please arrive 15 minutes prior to your appointment time.  Thank you. Contact information: [address] [place] [address] [phone]         Luretha Murphy, MD. Go on 08/04/2021.   Specialty: General Surgery Why: at 9am at the [facility] OFFICE.  Please arrive 15 minutes prior to your appointment time. Thank you. Contact information: [address] [place]  [address] [phone]         Jodelle Gross, NP Follow up on 08/06/2021.   Specialties: Nurse Practitioner, Radiology, Cardiology Why: @9 :15am for hospital follow up Contact information: [address] [place] [address]                 Signed: [phone] 06/16/2021, 4:28 PM

## 2021-06-16 NOTE — Progress Notes (Signed)
Patient alert and oriented, Post op day 1.  Provided support and encouragement.  Encouraged pulmonary toilet, ambulation and small sips of liquids.  All questions answered.  Will continue to monitor. 

## 2021-06-16 NOTE — Progress Notes (Addendum)
Progress Note  Patient Name: Christopher Burns Date of Encounter: 06/16/2021  [facility] Cardiologist: Little Ishikawa, MD   Subjective   Feeling well. No chest pain, sob or palpitations.    Inpatient Medications    Scheduled Meds:  acetaminophen  1,000 mg Oral Q8H   Or   acetaminophen (TYLENOL) oral liquid 160 mg/5 mL  1,000 mg Oral Q8H   enoxaparin   Does not apply Once   heparin injection (subcutaneous)  5,000 Units Subcutaneous Q8H   pantoprazole (PROTONIX) IV  40 mg Intravenous QHS   Ensure Max Protein  2 oz Oral Q2H   Continuous Infusions:  dextrose 5 % and 0.45 % NaCl with KCl 20 mEq/L 50 mL/hr at 06/15/21 1748   PRN Meds: albuterol, metoprolol tartrate, morphine injection, ondansetron (ZOFRAN) IV, oxyCODONE   Vital Signs    Vitals:   06/15/21 1351 06/15/21 1452 06/15/21 1950 06/15/21 1953  BP: 123/67 118/66 (!) 171/80 (!) 171/80  Pulse: 68 70 92 92  Resp: 16 18 20 20   Temp: (!) 97.4 F (36.3 C) 98 F (36.7 C) 99.9 F (37.7 C) 99.9 F (37.7 C)  TempSrc: Oral Oral Oral Oral  SpO2: 98% 100% 95% 98%  Weight:      Height:        Intake/Output Summary (Last 24 hours) at 06/16/2021 0907 Last data filed at 06/16/2021 0731 Gross per 24 hour  Intake 1122.45 ml  Output 825 ml  Net 297.45 ml   Last 3 Weights 06/15/2021 06/15/2021 05/31/2021  Weight (lbs) 286 lb 12.8 oz 286 lb 12.8 oz 295 lb  Weight (kg) 130.092 kg 130.092 kg 133.811 kg      Telemetry    SR at rate of 70s - Personally Reviewed  ECG    SR, LBBB - Personally Reviewed  Physical Exam   GEN: No acute distress.   Neck: No JVD Cardiac: RRR, no murmurs, rubs, or gallops.  Respiratory: Clear to auscultation bilaterally. GI: Soft, nontender, non-distended  MS: No edema; No deformity. Neuro:  Nonfocal  Psych: Normal affect   Labs    High Sensitivity Troponin:  No results for input(s): TROPONINIHS in the last 720 hours.   Chemistry Recent Labs  Lab 06/15/21 1339  CREATININE  1.27*  GFRNONAA >60    Lipids No results for input(s): CHOL, TRIG, HDL, LABVLDL, LDLCALC, CHOLHDL in the last 168 hours.  Hematology Recent Labs  Lab 06/15/21 1339 06/16/21 0454  WBC 11.8* 13.0*  RBC 5.56 5.29  HGB 14.8 14.2  HCT 45.4 42.6  MCV 81.7 80.5  MCH 26.6 26.8  MCHC 32.6 33.3  RDW 13.3 13.5  PLT 268 255   Radiology    ECHOCARDIOGRAM LIMITED  Result Date: 06/15/2021    ECHOCARDIOGRAM LIMITED REPORT   Patient Name:   Christopher Burns Date of Exam: 06/15/2021 Medical Rec #:  13/08/2020      Height:       71.0 in Accession #:    [id number]     Weight:       286.8 lb Date of Birth:  [birth year]     BSA:          2.458 m Patient Age:    50 years       BP:           118/66 mmHg Patient Gender: M              HR:           60 bpm.  Exam Location:  Inpatient Procedure: Limited Echo, Limited Color Doppler and Cardiac Doppler Indications:    Heart Block, LBBB  History:        Patient has prior history of Echocardiogram examinations, most                 recent 04/28/2021. Risk Factors:Hypertension and Sleep Apnea.  Sonographer:    Eulah Pont RDCS Referring Phys: [id number] Aylah Yeary L Zella Dewan IMPRESSIONS  1. Left ventricular ejection fraction, by estimation, is 55 to 60%. The left ventricle has normal function.  2. Right ventricular systolic function is normal. The right ventricular size is normal.  3. The mitral valve is normal in structure. Trivial mitral valve regurgitation.  4. The aortic valve is normal in structure. Aortic valve regurgitation is not visualized. Comparison(s): The left ventricular function is unchanged. FINDINGS  Left Ventricle: Left ventricular ejection fraction, by estimation, is 55 to 60%. The left ventricle has normal function. The left ventricular internal cavity size was small. There is no left ventricular hypertrophy. Right Ventricle: The right ventricular size is normal. Right vetricular wall thickness was not assessed. Right ventricular systolic function is normal.  Left Atrium: Left atrial size was normal in size. Right Atrium: Right atrial size was normal in size. Pericardium: There is no evidence of pericardial effusion. Mitral Valve: The mitral valve is normal in structure. There is mild thickening of the mitral valve leaflet(s). Trivial mitral valve regurgitation. Tricuspid Valve: The tricuspid valve is normal in structure. Tricuspid valve regurgitation is trivial. Aortic Valve: The aortic valve is normal in structure. Aortic valve regurgitation is not visualized. Pulmonic Valve: The pulmonic valve was normal in structure. Pulmonic valve regurgitation is trivial. Aorta: The aortic root is normal in size and structure. LEFT VENTRICLE PLAX 2D LVIDd:         5.10 cm      Diastology LVIDs:         2.60 cm      LV e' medial:    11.30 cm/s LV PW:         1.00 cm      LV E/e' medial:  8.1 LV IVS:        0.90 cm      LV e' lateral:   9.93 cm/s LVOT diam:     2.20 cm      LV E/e' lateral: 9.2 LV SV:         87 LV SV Index:   35 LVOT Area:     3.80 cm  LV Volumes (MOD) LV vol d, MOD A2C: 115.0 ml LV vol d, MOD A4C: 136.0 ml LV vol s, MOD A2C: 50.2 ml LV vol s, MOD A4C: 68.1 ml LV SV MOD A2C:     64.8 ml LV SV MOD A4C:     136.0 ml LV SV MOD BP:      69.2 ml RIGHT VENTRICLE RV S prime:     14.70 cm/s TAPSE (M-mode): 3.1 cm LEFT ATRIUM         Index LA diam:    3.50 cm 1.42 cm/m  AORTIC VALVE LVOT Vmax:   119.00 cm/s LVOT Vmean:  87.600 cm/s LVOT VTI:    0.229 m  AORTA Ao Root diam: 3.40 cm MITRAL VALVE MV Area (PHT): 3.72 cm    SHUNTS MV Decel Time: 204 msec    Systemic VTI:  0.23 m MV E velocity: 91.30 cm/s  Systemic Diam: 2.20 cm MV A velocity: 54.00 cm/s MV E/A ratio:  1.69 Dietrich Pates MD Electronically signed by Dietrich Pates MD Signature Date/Time: 06/15/2021/7:30:16 PM    Final     Cardiac Studies   Limited Echo 06/15/21 1. Left ventricular ejection fraction, by estimation, is 55 to 60%. The  left ventricle has normal function.   2. Right ventricular systolic function is  normal. The right ventricular  size is normal.   3. The mitral valve is normal in structure. Trivial mitral valve  regurgitation.   4. The aortic valve is normal in structure. Aortic valve regurgitation is  not visualized.   Comparison(s): The left ventricular function is unchanged.   Echo 04/28/21 1. Left ventricular ejection fraction, by estimation, is 55 to 60%. The  left ventricle has normal function. The left ventricle has no regional  wall motion abnormalities. Left ventricular diastolic parameters were  normal. The average left ventricular  global longitudinal strain is -17.2 %. The global longitudinal strain is  normal.   2. Right ventricular systolic function is normal. The right ventricular  size is normal.   3. The mitral valve is normal in structure. No evidence of mitral valve  regurgitation.   4. The aortic valve is tricuspid. Aortic valve regurgitation is not  visualized.   Patient Profile     50 y.o. male with a hx of bilateral pulmonary embolus 10/2020 with thrombectomy (taken off eliquis in Sept), polycythemia-testosterone induced, HLD and OSA with CPAP who is being seen 06/15/2021 for the evaluation of BBB in OR with gastric sleeve at the request of Dr. Daphine Deutscher.  Assessment & Plan    LBBB - Intermittent LBBB reported during gastric sleeve procedure which felt likely rate related.  - He was in LBBB when arrived to floor at rates of 80s but converted to sinus rhythm around midnight at rate of 70s -No anginal symptoms -Echocardiogram this admission with normal LV function.  No structural/valvular abnormality on limited echo. -May consider outpatient ischemic evaluation, though reports he is active without any chest pain or dyspnea  [facility] will sign off.   Medication Recommendations:  No changes Other recommendations (labs, testing, etc):  None Follow up as an outpatient:  Scheduled for 08/06/21 with Joni Reining, NP   For questions or updates, please  contact [facility] Please consult [email or web address] for contact info under        SignedManson Passey, PA  06/16/2021, 9:07 AM     Patient seen and examined.  Agree with above documentation.  On exam, patient is alert and oriented, regular rate and rhythm, no murmurs, lungs CTAB, no LE edema or JVD.  Echocardiogram unremarkable.  We will sign off, no further cardiac work-up recommended at this time.  Outpatient appointment scheduled for 12/23.  Little Ishikawa, MD

## 2021-06-18 ENCOUNTER — Telehealth (HOSPITAL_COMMUNITY): Payer: Self-pay | Admitting: *Deleted

## 2021-06-18 NOTE — Telephone Encounter (Signed)
1.  Tell me about your pain and pain management? Pt denies any pain.  2.  Let's talk about fluid intake.  How much total fluid are you taking in? Pt states that he is getting in at least 64oz of fluid including protein shakes, bottled water, jello, and Gatorade Zero.  Pt instructed to assess status and suggestions daily utilizing Hydration Action Plan on discharge folder and to call [facility] if in the "red zone".   3.  How much protein have you taken in the last 2 days? Pt states he is meeting his goal of 80g of protein each day with the protein shakes.  4.  Have you had nausea?  Tell me about when have experienced nausea and what you did to help? Pt denies nausea.   5.  Has the frequency or color changed with your urine? Pt states that he is urinating "fine" with no changes in frequency or urgency.     6.  Tell me what your incisions look like? "Incisions look fine". Pt denies a fever, chills.  Pt states incisions are not swollen, open, or draining.  Pt encouraged to call [facility] if incisions change.   7.  Have you been passing gas? BM? Pt states that he is having BMs. Last BM 06/18/21.    8.  If a problem or question were to arise who would you call?  Do you know contact numbers for [facility], [facility], and [facility]? Pt denies dehydration symptoms.  Pt can describe s/sx of dehydration.  Pt knows to call [facility] for surgical, [facility] for nutrition, and [facility] for non-urgent questions or concerns.   9.  How has the walking going? Pt states he is walking around and able to be active without difficulty.   10. Are you still using your incentive spirometer?  If so, how often? Pt states that he is using his I.S. Pt encouraged to use incentive spirometer, at least 10x every hour while awake until he sees the surgeon.  11.  How are your vitamins and calcium going?  How are you taking them? Pt states that he is taking his supplements and vitamins without difficulty.  Pt states that he is taking his Lovenox injections twice a  day without any difficulty.  Reminded patient that the first 30 days post-operatively are important for successful recovery.  Practice good hand hygiene, wearing a mask when appropriate (since optional in most places), and minimizing exposure to people who live outside of the home, especially if they are exhibiting any respiratory, GI, or illness-like symptoms.

## 2021-06-23 ENCOUNTER — Ambulatory Visit (INDEPENDENT_AMBULATORY_CARE_PROVIDER_SITE_OTHER): Payer: 59 | Admitting: Internal Medicine

## 2021-06-23 ENCOUNTER — Other Ambulatory Visit: Payer: Self-pay

## 2021-06-23 ENCOUNTER — Encounter: Payer: Self-pay | Admitting: Internal Medicine

## 2021-06-23 VITALS — Ht 71.0 in | Wt 273.1 lb

## 2021-06-23 DIAGNOSIS — I10 Essential (primary) hypertension: Secondary | ICD-10-CM | POA: Diagnosis not present

## 2021-06-23 DIAGNOSIS — Z9989 Dependence on other enabling machines and devices: Secondary | ICD-10-CM

## 2021-06-23 DIAGNOSIS — G4733 Obstructive sleep apnea (adult) (pediatric): Secondary | ICD-10-CM

## 2021-06-23 DIAGNOSIS — E662 Morbid (severe) obesity with alveolar hypoventilation: Secondary | ICD-10-CM

## 2021-06-23 DIAGNOSIS — I2602 Saddle embolus of pulmonary artery with acute cor pulmonale: Secondary | ICD-10-CM | POA: Diagnosis not present

## 2021-06-23 DIAGNOSIS — Z9884 Bariatric surgery status: Secondary | ICD-10-CM | POA: Diagnosis not present

## 2021-06-23 NOTE — Assessment & Plan Note (Signed)
Patient is on CPAP

## 2021-06-23 NOTE — Assessment & Plan Note (Signed)
Stable

## 2021-06-23 NOTE — Assessment & Plan Note (Signed)
Patient has a pulmonary embolus about 6 months ago now he is on blood thinner for another month.

## 2021-06-23 NOTE — Assessment & Plan Note (Signed)
Patient is doing well

## 2021-06-23 NOTE — Assessment & Plan Note (Signed)
Patient has lost 16 pounds

## 2021-06-23 NOTE — Progress Notes (Signed)
Established Patient Office Visit  Subjective:  Patient ID: Christopher Burns, male    DOB: 1971-08-06  Age: 50 y.o. MRN: [id number]  CC:  Chief Complaint  Patient presents with   Follow-up    HPI  Christopher Burns presents for general checkup.  He had a sleeve surgery for the stomach has lost about 16 pounds denies any chest pain shortness of breath or paroxysmal nocturnal dyspnea.  He has a history of pulmonary embolus in the past.  Patient also has a sleep apnea.  He sees Dr. Welton Flakes he also sees Dr. Smith Robert  the hematologist and  Dr Gwenevere Abbot   Past Surgical History:  Procedure Laterality Date   bisep Left 2007   COLONOSCOPY WITH PROPOFOL N/A 03/01/2019   Procedure: COLONOSCOPY WITH PROPOFOL;  Surgeon: Wyline Mood, MD;  Location: [facility] ENDOSCOPY;  Service: Gastroenterology;  Laterality: N/A;   KNEE SURGERY Right    LASIK     PULMONARY THROMBECTOMY Bilateral 10/23/2020   Procedure: PULMONARY THROMBECTOMY;  Surgeon: Annice Needy, MD;  Location: [facility] INVASIVE CV LAB;  Service: Cardiovascular;  Laterality: Bilateral;   TONSILLECTOMY     UMBILICAL HERNIA REPAIR N/A 07/29/2015   Procedure: HERNIA REPAIR UMBILICAL ADULT;  Surgeon: Kieth Brightly, MD;  Location: [facility] ORS;  Service: General;  Laterality: N/A;   UPPER GI ENDOSCOPY N/A 06/15/2021   Procedure: UPPER GI ENDOSCOPY;  Surgeon: Luretha Murphy, MD;  Location: [facility] ORS;  Service: General;  Laterality: N/A;   VASOTOMY      Family History  Problem Relation Age of Onset   High Cholesterol Mother    Breast cancer Mother 1   Diabetes Father    High blood pressure Father    High Cholesterol Father    Prostate cancer Paternal Grandfather    Cancer Maternal Grandfather        possible stomach cancer   Cancer Maternal Aunt        possible pancreatic cancer   Esophageal cancer Maternal Uncle    Breast cancer Cousin    Ovarian cancer Cousin     Social History   Socioeconomic History   Marital status: Married    Spouse name: [name]    Number of children: 1   Years of education: 13   Highest education level: Not on file  Occupational History   Occupation: work full time    Comment: [employer]  Tobacco Use   Smoking status: Never   Smokeless tobacco: Never  Vaping Use   Vaping Use: Never used  Substance and Sexual Activity   Alcohol use: Never   Drug use: Never   Sexual activity: Yes  Other Topics Concern   Not on file  Social History Narrative   Patient lives at home  with his wife [name]).  Patient works full time for [employer].   Education some college.   Right handed.   Caffeine one shot of colombian  coffee.   Social Determinants of Health   Financial Resource Strain: Not on file  Food Insecurity: Not on file  Transportation Needs: Not on file  Physical Activity: Not on file  Stress: Not on file  Social Connections: Not on file  Intimate Partner Violence: Not on file     Current Outpatient Medications:    albuterol (VENTOLIN HFA) 108 (90 Base) MCG/ACT inhaler, Inhale 2 puffs into the lungs every 4 (four) hours as needed for wheezing or shortness of breath., Disp: , Rfl:    cholecalciferol (VITAMIN D)  25 MCG (1000 UNIT) tablet, Take 1,000 Units by mouth daily., Disp: , Rfl:    enoxaparin (LOVENOX) 40 MG/0.4ML injection, Inject 1 syringe (40 mg total) into the skin 2 times daily., Disp: 24 mL, Rfl: 0   EPINEPHrine 0.3 mg/0.3 mL IJ SOAJ injection, Inject 0.3 mg into the muscle as needed for anaphylaxis., Disp: , Rfl:    esomeprazole (NEXIUM) 40 MG capsule, Take 40 mg by mouth at bedtime., Disp: , Rfl:    lisinopril-hydrochlorothiazide (ZESTORETIC) 20-12.5 MG tablet, Take 1 tablet by mouth daily., Disp: , Rfl:    montelukast (SINGULAIR) 10 MG tablet, Take 10 mg by mouth at bedtime. , Disp: , Rfl: 11   Multiple Vitamins-Minerals (MULTIVITAMIN WITH MINERALS) tablet, Take 1 tablet by mouth daily., Disp: , Rfl:    NON FORMULARY, Pt uses a cpap nightly, Disp: , Rfl:    omega-3 acid  ethyl esters (LOVAZA) 1 G capsule, Take 1 g by mouth daily., Disp: , Rfl:    ondansetron (ZOFRAN-ODT) 4 MG disintegrating tablet, Dissolve 1 tablet (4 mg total) by mouth every 6 hours as needed for nausea or vomiting., Disp: 20 tablet, Rfl: 0   oxyCODONE (OXY IR/ROXICODONE) 5 MG immediate release tablet, Take 1 tablet (5 mg total) by mouth every 6 hours as needed for severe pain., Disp: 10 tablet, Rfl: 0   polyvinyl alcohol (LIQUIFILM TEARS) 1.4 % ophthalmic solution, Place 1 drop into both eyes as needed for dry eyes., Disp: , Rfl:    simvastatin (ZOCOR) 10 MG tablet, TAKE 1 TABLET BY MOUTH EVERYDAY AT BEDTIME, Disp: 30 tablet, Rfl: 3   vitamin B-12 (CYANOCOBALAMIN) 100 MCG tablet, Take 100 mcg by mouth daily., Disp: , Rfl:    zinc gluconate 50 MG tablet, Take 50 mg by mouth daily., Disp: , Rfl:    Allergies  Allergen Reactions   Other Itching and Rash    Pt was tested and positive for feline, canine and cockroaches. Pt has rash, itching, watery eyes. And is on allergy injections since 2017    ROS Review of Systems  Constitutional: Negative.   HENT: Negative.    Eyes: Negative.   Respiratory: Negative.    Cardiovascular: Negative.   Gastrointestinal: Negative.   Endocrine: Negative.   Genitourinary: Negative.   Musculoskeletal: Negative.   Skin: Negative.   Allergic/Immunologic: Negative.   Neurological: Negative.   Hematological: Negative.   Psychiatric/Behavioral: Negative.    All other systems reviewed and are negative.    Objective:    Physical Exam Vitals reviewed.  Constitutional:      Appearance: Normal appearance.  HENT:     Mouth/Throat:     Mouth: Mucous membranes are moist.  Eyes:     Pupils: Pupils are equal, round, and reactive to light.  Neck:     Vascular: No carotid bruit.  Cardiovascular:     Rate and Rhythm: Normal rate and regular rhythm.     Pulses: Normal pulses.     Heart sounds: Normal heart sounds.  Pulmonary:     Effort: Pulmonary effort is  normal.     Breath sounds: Normal breath sounds.  Abdominal:     General: Bowel sounds are normal.     Palpations: Abdomen is soft. There is no hepatomegaly, splenomegaly or mass.     Tenderness: There is no abdominal tenderness.     Hernia: No hernia is present.  Musculoskeletal:     Cervical back: Neck supple.     Right lower leg: No edema.  Left lower leg: No edema.  Skin:    Findings: No rash.  Neurological:     Mental Status: He is alert and oriented to person, place, and time.     Motor: No weakness.  Psychiatric:        Mood and Affect: Mood normal.        Behavior: Behavior normal.    Ht 5\' 11"  (1.803 m)   Wt 273 lb 1.6 oz (123.9 kg)   BMI 38.09 kg/m  Wt Readings from Last 3 Encounters:  06/23/21 273 lb 1.6 oz (123.9 kg)  06/15/21 286 lb 12.8 oz (130.1 kg)  05/31/21 295 lb (133.8 kg)     Health Maintenance Due  Topic Date Due   Hepatitis C Screening  Never done    There are no preventive care reminders to display for this patient.  Lab Results  Component Value Date   TSH 1.270 11/18/2020   Lab Results  Component Value Date   WBC 13.0 (H) 06/16/2021   HGB 14.2 06/16/2021   HCT 42.6 06/16/2021   MCV 80.5 06/16/2021   PLT 255 06/16/2021   Lab Results  Component Value Date   NA 136 05/31/2021   K 4.1 05/31/2021   CO2 24 05/31/2021   GLUCOSE 93 05/31/2021   BUN 14 05/31/2021   CREATININE 1.27 (H) 06/15/2021   BILITOT 0.5 05/31/2021   ALKPHOS 44 05/31/2021   AST 35 05/31/2021   ALT 49 (H) 05/31/2021   PROT 7.2 05/31/2021   ALBUMIN 4.0 05/31/2021   CALCIUM 9.4 05/31/2021   ANIONGAP 7 05/31/2021   Lab Results  Component Value Date   CHOL 216 (H) 11/18/2020   Lab Results  Component Value Date   HDL 47 11/18/2020   Lab Results  Component Value Date   LDLCALC 135 (H) 11/18/2020   Lab Results  Component Value Date   TRIG 190 (H) 11/18/2020   No results found for: CHOLHDL Lab Results  Component Value Date   HGBA1C 6.2 (H) 05/31/2021       Assessment & Plan:   Problem List Items Addressed This Visit       Cardiovascular and Mediastinum   Hypertension    Stable      Pulmonary embolism (HCC) - Primary    Patient has a pulmonary embolus about 6 months ago now he is on blood thinner for another month.        Respiratory   OSA on CPAP    Patient is on CPAP        Other   Morbid obesity with alveolar hypoventilation (HCC)    Patient has lost 16 pounds      S/P laparoscopic sleeve gastrectomy    Patient is doing well       No orders of the defined types were placed in this encounter.   Follow-up: No follow-ups on file.    06/02/2021, MD

## 2021-06-29 ENCOUNTER — Encounter: Payer: 59 | Attending: Surgery | Admitting: Skilled Nursing Facility1

## 2021-06-29 ENCOUNTER — Other Ambulatory Visit: Payer: Self-pay

## 2021-06-30 NOTE — Progress Notes (Signed)
2 Week Post-Operative Nutrition Class   Patient was seen on 06/29/2021 for Post-Operative Nutrition education at the [facility]    Surgery date: 06/15/2021 Surgery type: sleeve Start weight at [facility]: 294.9 pounds Weight today: 272.2 pounds Bowel Habits: Every day to every other day no complaints   Body Composition Scale 06/29/2021  Current Body Weight 272.2  Total Body Fat % 33  Visceral Fat 25  Fat-Free Mass % 66.9   Total Body Water % 47.9  Muscle-Mass lbs 51.3  BMI 37.8  Body Fat Displacement          Torso  lbs 55.7         Left Leg  lbs 11.1         Right Leg  lbs 11.1         Left Arm  lbs 5.5         Right Arm   lbs 5.5      The following the learning objectives were met by the patient during this course: Identifies Phase 3 (Soft, High Proteins) Dietary Goals and will begin from 2 weeks post-operatively to 2 months post-operatively Identifies appropriate sources of fluids and proteins  Identifies appropriate fat sources and healthy verses unhealthy fat types   States protein recommendations and appropriate sources post-operatively Identifies the need for appropriate texture modifications, mastication, and bite sizes when consuming solids Identifies appropriate fat consumption and sources Identifies appropriate multivitamin and calcium sources post-operatively Describes the need for physical activity post-operatively and will follow MD recommendations States when to call healthcare provider regarding medication questions or post-operative complications   Handouts given during class include: Phase 3A: Soft, High Protein Diet Handout Phase 3 High Protein Meals Healthy Fats   Follow-Up Plan: Patient will follow-up at [facility] in 6 weeks for 2 month post-op nutrition visit for diet advancement per MD.

## 2021-07-05 ENCOUNTER — Telehealth: Payer: Self-pay | Admitting: Skilled Nursing Facility1

## 2021-07-05 NOTE — Telephone Encounter (Signed)
RD called pt to verify fluid intake once starting soft, solid proteins 2 week post-bariatric surgery.   Daily Fluid intake:  Daily Protein intake: Bowel Habits:   Concerns/issues:    LVM 

## 2021-07-05 NOTE — Telephone Encounter (Signed)
Pt returned call.  Pt states he is doing well with no concerns.

## 2021-07-13 ENCOUNTER — Other Ambulatory Visit (HOSPITAL_COMMUNITY): Payer: Self-pay

## 2021-07-14 ENCOUNTER — Other Ambulatory Visit: Payer: Self-pay | Admitting: Internal Medicine

## 2021-08-02 NOTE — Progress Notes (Deleted)
Cardiology Office Note   Date:  08/02/2021   ID:  Christopher Burns, DOB Aug 21, 1970, MRN [id number]  PCP:  Cletis Athens, MD  Cardiologist:  Dr.Schumann  No chief complaint on file.    History of Present Illness: Christopher Burns is a 50 y.o. male who presents for posthospitalization follow-up, with history of pulmonary emboli in October 14, 2020 with thrombectomy, polycythemia testosterone induced, hyperlipidemia, OSA on CPAP, who was seen during recent hospitalization for cardiac evaluation prior to gastric sleeve in the setting of a left bundle branch block.  Echocardiogram revealed normal LV function without any structural valvular abnormality, his left bundle branch block converted to normal sinus rhythm after arrival to the floor postoperatively.  It was recommended that he may have ischemic work-up if he became symptomatic with chest pain or dyspnea.    Past Medical History:  Diagnosis Date   Asthma    Dizziness    Family history of BRCA1 gene positive    Family history of breast cancer    Family history of ovarian cancer    Family history of prostate cancer    GERD (gastroesophageal reflux disease)    Hemorrhoid    High blood pressure    High cholesterol    Hx of blood clots    Hypercholesteremia    Polycythemia    Pre-diabetes    Sleep apnea    cpap    Past Surgical History:  Procedure Laterality Date   bisep Left 2007   COLONOSCOPY WITH PROPOFOL N/A 03/01/2019   Procedure: COLONOSCOPY WITH PROPOFOL;  Surgeon: Jonathon Bellows, MD;  Location: [facility] ENDOSCOPY;  Service: Gastroenterology;  Laterality: N/A;   KNEE SURGERY Right    LASIK     PULMONARY THROMBECTOMY Bilateral 10/23/2020   Procedure: PULMONARY THROMBECTOMY;  Surgeon: Algernon Huxley, MD;  Location: [facility];  Service: Cardiovascular;  Laterality: Bilateral;   TONSILLECTOMY     UMBILICAL HERNIA REPAIR N/A 07/29/2015   Procedure: HERNIA REPAIR UMBILICAL ADULT;  Surgeon: Christene Lye, MD;  Location:  [facility] ORS;  Service: General;  Laterality: N/A;   UPPER GI ENDOSCOPY N/A 06/15/2021   Procedure: UPPER GI ENDOSCOPY;  Surgeon: Johnathan Hausen, MD;  Location: [facility] ORS;  Service: General;  Laterality: N/A;   VASOTOMY       Current Outpatient Medications  Medication Sig Dispense Refill   albuterol (VENTOLIN HFA) 108 (90 Base) MCG/ACT inhaler Inhale 2 puffs into the lungs every 4 (four) hours as needed for wheezing or shortness of breath.     cholecalciferol (VITAMIN D) 25 MCG (1000 UNIT) tablet Take 1,000 Units by mouth daily.     enoxaparin (LOVENOX) 40 MG/0.4ML injection Inject 1 syringe (40 mg total) into the skin 2 times daily. 24 mL 0   EPINEPHrine 0.3 mg/0.3 mL IJ SOAJ injection Inject 0.3 mg into the muscle as needed for anaphylaxis.     esomeprazole (NEXIUM) 40 MG capsule Take 40 mg by mouth at bedtime.     lisinopril-hydrochlorothiazide (ZESTORETIC) 20-12.5 MG tablet Take 1 tablet by mouth daily.     montelukast (SINGULAIR) 10 MG tablet Take 10 mg by mouth at bedtime.   11   Multiple Vitamins-Minerals (MULTIVITAMIN WITH MINERALS) tablet Take 1 tablet by mouth daily.     NON FORMULARY Pt uses a cpap nightly     omega-3 acid ethyl esters (LOVAZA) 1 G capsule Take 1 g by mouth daily.     ondansetron (ZOFRAN-ODT) 4 MG disintegrating tablet Dissolve 1 tablet (4 mg total)  by mouth every 6 hours as needed for nausea or vomiting. 20 tablet 0   oxyCODONE (OXY IR/ROXICODONE) 5 MG immediate release tablet Take 1 tablet (5 mg total) by mouth every 6 hours as needed for severe pain. 10 tablet 0   polyvinyl alcohol (LIQUIFILM TEARS) 1.4 % ophthalmic solution Place 1 drop into both eyes as needed for dry eyes.     simvastatin (ZOCOR) 10 MG tablet TAKE 1 TABLET BY MOUTH EVERYDAY AT BEDTIME 30 tablet 3   vitamin B-12 (CYANOCOBALAMIN) 100 MCG tablet Take 100 mcg by mouth daily.     zinc gluconate 50 MG tablet Take 50 mg by mouth daily.     No current facility-administered medications for this visit.     Allergies:   Other    Social History:  The patient  reports that he has never smoked. He has never used smokeless tobacco. He reports that he does not drink alcohol and does not use drugs.   Family History:  The patient's family history includes Breast cancer in his cousin; Breast cancer (age of onset: 26) in his mother; Cancer in his maternal aunt and maternal grandfather; Diabetes in his father; Esophageal cancer in his maternal uncle; High Cholesterol in his father and mother; High blood pressure in his father; Ovarian cancer in his cousin; Prostate cancer in his paternal grandfather.    ROS: All other systems are reviewed and negative. Unless otherwise mentioned in H&P    PHYSICAL EXAM: VS:  There were no vitals taken for this visit. , BMI There is no height or weight on file to calculate BMI. GEN: Well nourished, well developed, in no acute distress HEENT: normal Neck: no JVD, carotid bruits, or masses Cardiac: ***RRR; no murmurs, rubs, or gallops,no edema  Respiratory:  Clear to auscultation bilaterally, normal work of breathing GI: soft, nontender, nondistended, + BS MS: no deformity or atrophy Skin: warm and dry, no rash Neuro:  Strength and sensation are intact Psych: euthymic mood, full affect   EKG:  EKG {ACTION; IS/IS EXB:[id number]} ordered today. The ekg ordered today demonstrates ***   Recent Labs: 10/24/2020: Magnesium 2.3 11/18/2020: TSH 1.270 05/31/2021: ALT 49; BUN 14; Potassium 4.1; Sodium 136 2021/06/18: Creatinine, Ser 1.27 06/16/2021: Hemoglobin 14.2; Platelets 255    Lipid Panel    Component Value Date/Time   CHOL 216 (H) 11/18/2020 0950   TRIG 190 (H) 11/18/2020 0950   HDL 47 11/18/2020 0950   LDLCALC 135 (H) 11/18/2020 0950      Wt Readings from Last 3 Encounters:  06/30/21 272 lb 3.2 oz (123.5 kg)  06/23/21 273 lb 1.6 oz (123.9 kg)  06/18/2021 286 lb 12.8 oz (130.1 kg)      Other studies Reviewed: Echocardiogram 2021/06/18  1. Left  ventricular ejection fraction, by estimation, is 55 to 60%. The  left ventricle has normal function.   2. Right ventricular systolic function is normal. The right ventricular  size is normal.   3. The mitral valve is normal in structure. Trivial mitral valve  regurgitation.   4. The aortic valve is normal in structure. Aortic valve regurgitation is  not visualized.   ASSESSMENT AND PLAN:  1.  ***   Current medicines are reviewed at length with the patient today.  I have spent *** dedicated to the care of this patient on the date of this encounter to include pre-visit review of records, assessment, management and diagnostic testing,with shared decision making.  Labs/ tests ordered today include: *** Phill Myron. Purcell Nails  DNP, ANP, AACC   08/02/2021 4:26 PM    [facility] [address] Office [phone] Fax [phone]  Notice: This dictation was prepared with Dragon dictation along with smaller phrase technology. Any transcriptional errors that result from this process are unintentional and may not be corrected upon review.

## 2021-08-06 ENCOUNTER — Ambulatory Visit: Payer: 59 | Admitting: Adult Health

## 2021-08-10 ENCOUNTER — Other Ambulatory Visit: Payer: Self-pay

## 2021-08-10 ENCOUNTER — Encounter: Payer: 59 | Attending: Surgery | Admitting: Skilled Nursing Facility1

## 2021-08-10 NOTE — Progress Notes (Signed)
Bariatric Nutrition Follow-Up Visit Medical Nutrition Therapy    NUTRITION ASSESSMENT   Surgery date: 06/15/2021 Surgery type: sleeve Start weight at [facility]: 294.9 pounds Weight today: 250.8 pounds Bowel Habits: Every day to every other day no complaints   Body Composition Scale 06/29/2021 08/10/2021  Current Body Weight 272.2 250.8  Total Body Fat % 33 30.3  Visceral Fat 25 21  Fat-Free Mass % 66.9 69.6   Total Body Water % 47.9 50.6  Muscle-Mass lbs 51.3 47.2  BMI 37.8 34.8  Body Fat Displacement           Torso  lbs 55.7 47.1         Left Leg  lbs 11.1 9.4         Right Leg  lbs 11.1 9.4         Left Arm  lbs 5.5 4.7         Right Arm   lbs 5.5 4.7   Clinical  Medical hx: sleep apnea, hypercholesterolemia, blood clots, predaibetes, CKD, GERD Medications: see list Labs: cholesterol 216, triglycerdies 190, A1C 6.2, BUN 14, calcium 9.4, phosphorus 4.8 (not updated), creatinine 1.27 Notable signs/symptoms: states he does have a tick Any previous deficiencies? Iron   Lifestyle & Dietary Hx  Pt states he feels energized and not tired at all.    Estimated daily fluid intake: 60-70 oz Estimated daily protein intake: 80+ g Supplements: multi and calcium Current average weekly physical activity: 4 days a week: weights and walking   24-Hr Dietary Recall First Meal: 1 egg + ham + cheese or egg bites  Snack: nuts  Second Meal: chicken soup or beans + heart of palm rice Snack: edamame   Third Meal: skipped or steak or shrimp + fake crab Snack:  Beverages: espresso cuban coffee, water, sugar free energy drink (not daily)  Post-Op Goals/ Signs/ Symptoms Using straws: no Drinking while eating: no Chewing/swallowing difficulties: no Changes in vision: no Changes to mood/headaches: no Hair loss/changes to skin/nails: no Difficulty focusing/concentrating: no Sweating: no Limb weakness: no Dizziness/lightheadedness: no Palpitations: no  Carbonated/caffeinated beverages:  no N/V/D/C/Gas: every 3 days with mirialx  Abdominal pain: no Dumping syndrome: no    NUTRITION DIAGNOSIS  Overweight/obesity ([place]-3.3) related to past poor dietary habits and physical inactivity as evidenced by completed bariatric surgery and following dietary guidelines for continued weight loss and healthy nutrition status.     NUTRITION INTERVENTION Nutrition counseling (C-1) and education (E-2) to facilitate bariatric surgery goals, including: Diet advancement to the next phase (phase 4) now including non starchy  The importance of consuming adequate calories as well as certain nutrients daily due to the body's need for essential vitamins, minerals, and fats The importance of daily physical activity and to reach a goal of at least 150 minutes of moderate to vigorous physical activity weekly (or as directed by their physician) due to benefits such as increased musculature and improved lab values The importance of intuitive eating specifically learning hunger-satiety cues and understanding the importance of learning a new body: The importance of mindful eating to avoid grazing behaviors   Goals: -Continue to aim for a minimum of 64 fluid ounces 7 days a week with at least 30 ounces being plain water  -Eat non-starchy vegetables 2 times a day 7 days a week  -Start out with soft cooked vegetables today and tomorrow; if tolerated begin to eat raw vegetables or cooked including salads  -Eat your 3 ounces of protein first then start in on  your non-starchy vegetables; once you understand how much of your meal leads to satisfaction and not full while still eating 3 ounces of protein and non-starchy vegetables you can eat them in any order   -Continue to aim for 30 minutes of activity at least 5 times a week  -Do NOT cook with/add to your food: alfredo sauce, cheese sauce, barbeque sauce, ketchup, fat back, butter, bacon grease, grease, Crisco, OR SUGAR   Handouts Provided Include  Phase  4  Learning Style & Readiness for Change Teaching method utilized: Visual & Auditory  Demonstrated degree of understanding via: Teach Back  Readiness Level: Action Barriers to learning/adherence to lifestyle change: none stated  RD's Notes for Next Visit Assess adherence to pt chosen goals    MONITORING & EVALUATION Dietary intake, weekly physical activity, body weight  Next Steps Patient is to follow-up in 2 months

## 2021-08-17 ENCOUNTER — Other Ambulatory Visit: Payer: Self-pay | Admitting: Internal Medicine

## 2021-08-17 DIAGNOSIS — Z0000 Encounter for general adult medical examination without abnormal findings: Secondary | ICD-10-CM

## 2021-09-27 ENCOUNTER — Encounter: Payer: Self-pay | Admitting: Internal Medicine

## 2021-09-27 ENCOUNTER — Encounter: Payer: 59 | Attending: Surgery | Admitting: Skilled Nursing Facility1

## 2021-09-27 ENCOUNTER — Ambulatory Visit: Payer: 59 | Admitting: Internal Medicine

## 2021-09-27 ENCOUNTER — Other Ambulatory Visit: Payer: Self-pay

## 2021-09-27 VITALS — BP 125/70 | HR 54 | Ht 70.0 in | Wt 249.2 lb

## 2021-09-27 DIAGNOSIS — E669 Obesity, unspecified: Secondary | ICD-10-CM | POA: Diagnosis not present

## 2021-09-27 DIAGNOSIS — I10 Essential (primary) hypertension: Secondary | ICD-10-CM

## 2021-09-27 DIAGNOSIS — I2602 Saddle embolus of pulmonary artery with acute cor pulmonale: Secondary | ICD-10-CM

## 2021-09-27 DIAGNOSIS — E7800 Pure hypercholesterolemia, unspecified: Secondary | ICD-10-CM

## 2021-09-27 DIAGNOSIS — G4733 Obstructive sleep apnea (adult) (pediatric): Secondary | ICD-10-CM | POA: Diagnosis not present

## 2021-09-27 DIAGNOSIS — Z9989 Dependence on other enabling machines and devices: Secondary | ICD-10-CM

## 2021-09-27 NOTE — Assessment & Plan Note (Signed)
Patient is not taking any blood thinner anymore

## 2021-09-27 NOTE — Progress Notes (Signed)
Established Patient Office Visit  Subjective:  Patient ID: Christopher Burns, male    DOB: 04/11/1971  Age: 51 y.o. MRN: [id number]  CC:  Chief Complaint  Patient presents with   Follow-up    Patient here today for follow up     HPI  Christopher Burns presents for check up  Past Medical History:  Diagnosis Date   Asthma    Dizziness    Family history of BRCA1 gene positive    Family history of breast cancer    Family history of ovarian cancer    Family history of prostate cancer    GERD (gastroesophageal reflux disease)    Hemorrhoid    High blood pressure    High cholesterol    Hx of blood clots    Hypercholesteremia    Polycythemia    Pre-diabetes    Sleep apnea    cpap    Past Surgical History:  Procedure Laterality Date   bisep Left 2007   COLONOSCOPY WITH PROPOFOL N/A 03/01/2019   Procedure: COLONOSCOPY WITH PROPOFOL;  Surgeon: Jonathon Bellows, MD;  Location: [facility] ENDOSCOPY;  Service: Gastroenterology;  Laterality: N/A;   KNEE SURGERY Right    LASIK     PULMONARY THROMBECTOMY Bilateral 10/23/2020   Procedure: PULMONARY THROMBECTOMY;  Surgeon: Algernon Huxley, MD;  Location: [place];  Service: Cardiovascular;  Laterality: Bilateral;   TONSILLECTOMY     UMBILICAL HERNIA REPAIR N/A 07/29/2015   Procedure: HERNIA REPAIR UMBILICAL ADULT;  Surgeon: Christene Lye, MD;  Location: [facility] ORS;  Service: General;  Laterality: N/A;   UPPER GI ENDOSCOPY N/A 06/15/2021   Procedure: UPPER GI ENDOSCOPY;  Surgeon: Johnathan Hausen, MD;  Location: [facility] ORS;  Service: General;  Laterality: N/A;   VASOTOMY      Family History  Problem Relation Age of Onset   High Cholesterol Mother    Breast cancer Mother 70   Diabetes Father    High blood pressure Father    High Cholesterol Father    Prostate cancer Paternal Grandfather    Cancer Maternal Grandfather        possible stomach cancer   Cancer Maternal Aunt        possible pancreatic cancer   Esophageal cancer  Maternal Uncle    Breast cancer Cousin    Ovarian cancer Cousin     Social History   Socioeconomic History   Marital status: Married    Spouse name: [name]   Number of children: 1   Years of education: 13   Highest education level: Not on file  Occupational History   Occupation: work full time    Comment: [employer]  Tobacco Use   Smoking status: Never   Smokeless tobacco: Never  Vaping Use   Vaping Use: Never used  Substance and Sexual Activity   Alcohol use: Never   Drug use: Never   Sexual activity: Yes  Other Topics Concern   Not on file  Social History Narrative   Patient lives at home  with his wife [name]).  Patient works full time for [employer].   Education some college.   Right handed.   Caffeine one shot of colombian  coffee.   Social Determinants of Health   Financial Resource Strain: Not on file  Food Insecurity: Not on file  Transportation Needs: Not on file  Physical Activity: Not on file  Stress: Not on file  Social Connections: Not on file  Intimate Partner Violence:  Not on file     Current Outpatient Medications:    albuterol (VENTOLIN HFA) 108 (90 Base) MCG/ACT inhaler, Inhale 2 puffs into the lungs every 4 (four) hours as needed for wheezing or shortness of breath., Disp: , Rfl:    cholecalciferol (VITAMIN D) 25 MCG (1000 UNIT) tablet, Take 1,000 Units by mouth daily., Disp: , Rfl:    EPINEPHrine 0.3 mg/0.3 mL IJ SOAJ injection, Inject 0.3 mg into the muscle as needed for anaphylaxis., Disp: , Rfl:    esomeprazole (NEXIUM) 40 MG capsule, Take 40 mg by mouth at bedtime., Disp: , Rfl:    lisinopril-hydrochlorothiazide (ZESTORETIC) 20-12.5 MG tablet, TAKE 2 TABLETS BY MOUTH EVERY DAY, Disp: 60 tablet, Rfl: 14   montelukast (SINGULAIR) 10 MG tablet, Take 10 mg by mouth at bedtime. , Disp: , Rfl: 11   Multiple Vitamins-Minerals (MULTIVITAMIN WITH MINERALS) tablet, Take 1 tablet by mouth daily., Disp: , Rfl:    NON FORMULARY, Pt  uses a cpap nightly, Disp: , Rfl:    omega-3 acid ethyl esters (LOVAZA) 1 G capsule, Take 1 g by mouth daily., Disp: , Rfl:    ondansetron (ZOFRAN-ODT) 4 MG disintegrating tablet, Dissolve 1 tablet (4 mg total) by mouth every 6 hours as needed for nausea or vomiting., Disp: 20 tablet, Rfl: 0   simvastatin (ZOCOR) 10 MG tablet, TAKE 1 TABLET BY MOUTH EVERYDAY AT BEDTIME, Disp: 30 tablet, Rfl: 3   enoxaparin (LOVENOX) 40 MG/0.4ML injection, Inject 1 syringe (40 mg total) into the skin 2 times daily., Disp: 24 mL, Rfl: 0   oxyCODONE (OXY IR/ROXICODONE) 5 MG immediate release tablet, Take 1 tablet (5 mg total) by mouth every 6 hours as needed for severe pain., Disp: 10 tablet, Rfl: 0   polyvinyl alcohol (LIQUIFILM TEARS) 1.4 % ophthalmic solution, Place 1 drop into both eyes as needed for dry eyes., Disp: , Rfl:    vitamin B-12 (CYANOCOBALAMIN) 100 MCG tablet, Take 100 mcg by mouth daily., Disp: , Rfl:    zinc gluconate 50 MG tablet, Take 50 mg by mouth daily., Disp: , Rfl:    Allergies  Allergen Reactions   Other Itching and Rash    Pt was tested and positive for feline, canine and cockroaches. Pt has rash, itching, watery eyes. And is on allergy injections since 2017    ROS Review of Systems  Constitutional: Negative.   HENT: Negative.    Eyes: Negative.   Respiratory: Negative.    Cardiovascular: Negative.   Gastrointestinal: Negative.   Endocrine: Negative.   Genitourinary: Negative.   Musculoskeletal: Negative.   Skin: Negative.   Allergic/Immunologic: Negative.   Neurological: Negative.   Hematological: Negative.   Psychiatric/Behavioral: Negative.    All other systems reviewed and are negative.    Objective:    Physical Exam Vitals reviewed.  Constitutional:      Appearance: Normal appearance.  HENT:     Mouth/Throat:     Mouth: Mucous membranes are moist.  Eyes:     Pupils: Pupils are equal, round, and reactive to light.  Neck:     Vascular: No carotid bruit.   Cardiovascular:     Rate and Rhythm: Normal rate and regular rhythm.     Pulses: Normal pulses.     Heart sounds: Normal heart sounds.  Pulmonary:     Effort: Pulmonary effort is normal.     Breath sounds: Normal breath sounds.  Abdominal:     General: Bowel sounds are normal.     Palpations:  Abdomen is soft. There is no hepatomegaly, splenomegaly or mass.     Tenderness: There is no abdominal tenderness.     Hernia: No hernia is present.  Musculoskeletal:     Cervical back: Neck supple.     Right lower leg: No edema.     Left lower leg: No edema.  Skin:    Findings: No rash.  Neurological:     Mental Status: He is alert and oriented to person, place, and time.     Motor: No weakness.  Psychiatric:        Mood and Affect: Mood normal.        Behavior: Behavior normal.    BP 125/70    Pulse (!) 54    Ht '5\' 10"'  (1.778 m)    Wt 249 lb 3.2 oz (113 kg)    BMI 35.76 kg/m  Wt Readings from Last 3 Encounters:  09/27/21 249 lb 3.2 oz (113 kg)  08/10/21 250 lb (113.4 kg)  06/30/21 272 lb 3.2 oz (123.5 kg)     Health Maintenance Due  Topic Date Due   Hepatitis C Screening  Never done   COVID-19 Vaccine (3 - Booster for Pfizer series) 01/07/2020   Zoster Vaccines- Shingrix (1 of 2) Never done    There are no preventive care reminders to display for this patient.  Lab Results  Component Value Date   TSH 1.270 11/18/2020   Lab Results  Component Value Date   WBC 13.0 (H) 06/16/2021   HGB 14.2 06/16/2021   HCT 42.6 06/16/2021   MCV 80.5 06/16/2021   PLT 255 06/16/2021   Lab Results  Component Value Date   NA 136 05/31/2021   K 4.1 05/31/2021   CO2 24 05/31/2021   GLUCOSE 93 05/31/2021   BUN 14 05/31/2021   CREATININE 1.27 (H) 06/15/2021   BILITOT 0.5 05/31/2021   ALKPHOS 44 05/31/2021   AST 35 05/31/2021   ALT 49 (H) 05/31/2021   PROT 7.2 05/31/2021   ALBUMIN 4.0 05/31/2021   CALCIUM 9.4 05/31/2021   ANIONGAP 7 05/31/2021   Lab Results  Component Value  Date   CHOL 216 (H) 11/18/2020   Lab Results  Component Value Date   HDL 47 11/18/2020   Lab Results  Component Value Date   LDLCALC 135 (H) 11/18/2020   Lab Results  Component Value Date   TRIG 190 (H) 11/18/2020   No results found for: CHOLHDL Lab Results  Component Value Date   HGBA1C 6.2 (H) 05/31/2021      Assessment & Plan:   Problem List Items Addressed This Visit       Cardiovascular and Mediastinum   Hypertension - Primary     Patient denies any chest pain or shortness of breath there is no history of palpitation or paroxysmal nocturnal dyspnea   patient was advised to follow low-salt low-cholesterol diet    ideally I want to keep systolic blood pressure below 130 mmHg, patient was asked to check blood pressure one times a week and give me a report on that.  Patient will be follow-up in 3 months  or earlier as needed, patient will call me back for any change in the cardiovascular symptoms Patient was advised to buy a book from local bookstore concerning blood pressure and read several chapters  every day.  This will be supplemented by some of the material we will give him from the office.  Patient should also utilize other resources like YouTube and  Internet to learn more about the blood pressure and the diet.      Pulmonary embolism ([place])    Patient is not taking any blood thinner anymore        Respiratory   OSA on CPAP    Patient is using CPAP machine        Other   High cholesterol    Hypercholesterolemia  I advised the patient to follow Mediterranean diet This diet is rich in fruits vegetables and whole grain, and This diet is also rich in fish and lean meat Patient should also eat a handful of almonds or walnuts daily Recent heart study indicated that average follow-up on this kind of diet reduces the cardiovascular mortality by 50 to 70%==       No orders of the defined types were placed in this encounter.   Follow-up: No follow-ups on  file.    Cletis Athens, MD

## 2021-09-27 NOTE — Assessment & Plan Note (Signed)
Patient is using CPAP machine 

## 2021-09-27 NOTE — Progress Notes (Signed)
Bariatric Nutrition Follow-Up Visit Medical Nutrition Therapy    NUTRITION ASSESSMENT   Surgery date: 06/15/2021 Surgery type: sleeve Start weight at [facility]: 294.9 pounds Weight today: 247 pounds   Body Composition Scale 06/29/2021 08/10/2021 09/27/2021  Current Body Weight 272.2 250.8 247.3  Total Body Fat % 33 30.3 29.8  Visceral Fat 25 21 21   Fat-Free Mass % 66.9 69.6 70.1   Total Body Water % 47.9 50.6 51.1  Muscle-Mass lbs 51.3 47.2 46.6  BMI 37.8 34.8 34.3  Body Fat Displacement            Torso  lbs 55.7 47.1 45.7         Left Leg  lbs 11.1 9.4 9.1         Right Leg  lbs 11.1 9.4 9.1         Left Arm  lbs 5.5 4.7 4.5         Right Arm   lbs 5.5 4.7 4.5   Clinical  Medical hx: sleep apnea, hypercholesterolemia, blood clots, predaibetes, CKD, GERD Medications: see list Labs: cholesterol 216, triglycerdies 190, A1C 6.2, BUN 14, calcium 9.4, phosphorus 4.8 (not updated), creatinine 1.27 Notable signs/symptoms: states he does have a tick Any previous deficiencies? Iron   Lifestyle & Dietary Hx  Pt states he feels energized and not tired at all.    Estimated daily fluid intake: 50 oz Estimated daily protein intake: 80+ g Supplements: multi and calcium Current average weekly physical activity: 4 days a week: weights and walking   24-Hr Dietary Recall First Meal: 1 egg + ham + cheese or egg bites + cheese Snack: nuts or protein wafer thing Second Meal: chicken soup or beans + heart of palm rice or scrambled egg + spinach + onion + cornbeef hash Snack: edamame   Third Meal: skipped or steak or shrimp + fake crab or chicken Snack: protein wafer Beverages: espresso cuban coffee, water, sugar free energy drink, kefir + stevia + sugar free syrup  Post-Op Goals/ Signs/ Symptoms Using straws: no Drinking while eating: no Chewing/swallowing difficulties: no Changes in vision: no Changes to mood/headaches: no Hair loss/changes to skin/nails: no Difficulty  focusing/concentrating: no Sweating: no Limb weakness: no Dizziness/lightheadedness: no Palpitations: no  Carbonated/caffeinated beverages: no N/V/D/C/Gas: every 3 days with mirialx  Abdominal pain: no Dumping syndrome: no    NUTRITION DIAGNOSIS  Overweight/obesity ([place]-3.3) related to past poor dietary habits and physical inactivity as evidenced by completed bariatric surgery and following dietary guidelines for continued weight loss and healthy nutrition status.     NUTRITION INTERVENTION Nutrition counseling (C-1) and education (E-2) to facilitate bariatric surgery goals, including: The importance of consuming adequate calories as well as certain nutrients daily due to the body's need for essential vitamins, minerals, and fats The importance of daily physical activity and to reach a goal of at least 150 minutes of moderate to vigorous physical activity weekly (or as directed by their physician) due to benefits such as increased musculature and improved lab values The importance of intuitive eating specifically learning hunger-satiety cues and understanding the importance of learning a new body: The importance of mindful eating to avoid grazing behaviors  Encouraged patient to honor their body's internal hunger and fullness cues.  Throughout the day, check in mentally and rate hunger. Stop eating when satisfied not full regardless of how much food is left on the plate.  Get more if still hungry 20-30 minutes later.  The key is to honor satisfaction so throughout the  meal, rate fullness factor and stop when comfortably satisfied not physically full. The key is to honor hunger and fullness without any feelings of guilt or shame.  Pay attention to what the internal cues are, rather than any external factors. This will enhance the confidence you have in listening to your own body and following those internal cues enabling you to increase how often you eat when you are hungry not out of appetite and  stop when you are satisfied not full.  Encouraged pt to continue to eat balanced meals inclusive of non starchy vegetables 2 times a day 7 days a week Encouraged pt to choose lean protein sources: limiting beef, pork, sausage, hotdogs, and lunch meat Encourage pt to choose healthy fats such as plant based limiting animal fats Encouraged pt to continue to drink a minium 64 fluid ounces with half being plain water to satisfy proper hydration     Handouts Provided Include    Learning Style & Readiness for Change Teaching method utilized: Visual & Auditory  Demonstrated degree of understanding via: Teach Back  Readiness Level: Action Barriers to learning/adherence to lifestyle change: none stated  RD's Notes for Next Visit Assess adherence to pt chosen goals    MONITORING & EVALUATION Dietary intake, weekly physical activity, body weight  Next Steps Patient is to follow-up in 6 months

## 2021-09-27 NOTE — Assessment & Plan Note (Signed)
Hypercholesterolemia  I advised the patient to follow Mediterranean diet This diet is rich in fruits vegetables and whole grain, and This diet is also rich in fish and lean meat Patient should also eat a handful of almonds or walnuts daily Recent heart study indicated that average follow-up on this kind of diet reduces the cardiovascular mortality by 50 to 70%== 

## 2021-09-27 NOTE — Assessment & Plan Note (Signed)

## 2021-09-29 NOTE — Progress Notes (Signed)
Cardiology Clinic Note   Patient Name: Christopher Burns Date of Encounter: 10/01/2021  Primary Care Provider:  Cletis Athens, MD Primary Cardiologist:  Donato Heinz, MD  Patient Profile    51 year old male with history of bilateral pulmonary emboli March 2022 in the popliteal vein on the right,  thrombectomy (taken off of Eliquis in September 2021), polycythemia-testosterone induced, hyperlipidemia, OSA on CPAP, who was seen on consultation during recent hospitalization in November 22 by Dr. Alda Lea for evaluation of new left bundle branch block while undergoing gastric sleeve surgery.  Left bundle branch block was resolved once he returned to his room and remained in normal sinus rhythm throughout the rest of the hospitalization.  Outpatient ischemic evaluation was considered on follow-up if he was symptomatic.   Past Medical History    Past Medical History:  Diagnosis Date   Asthma    Dizziness    Family history of BRCA1 gene positive    Family history of breast cancer    Family history of ovarian cancer    Family history of prostate cancer    GERD (gastroesophageal reflux disease)    Hemorrhoid    High blood pressure    High cholesterol    Hx of blood clots    Hypercholesteremia    Polycythemia    Pre-diabetes    Sleep apnea    cpap   Past Surgical History:  Procedure Laterality Date   bisep Left 2007   COLONOSCOPY WITH PROPOFOL N/A 03/01/2019   Procedure: COLONOSCOPY WITH PROPOFOL;  Surgeon: Jonathon Bellows, MD;  Location: [facility] ENDOSCOPY;  Service: Gastroenterology;  Laterality: N/A;   KNEE SURGERY Right    LASIK     PULMONARY THROMBECTOMY Bilateral 10/23/2020   Procedure: PULMONARY THROMBECTOMY;  Surgeon: Algernon Huxley, MD;  Location: [place] [facility];  Service: Cardiovascular;  Laterality: Bilateral;   TONSILLECTOMY     UMBILICAL HERNIA REPAIR N/A 07/29/2015   Procedure: HERNIA REPAIR UMBILICAL ADULT;  Surgeon: Christene Lye, MD;  Location: [facility] ORS;   Service: General;  Laterality: N/A;   UPPER GI ENDOSCOPY N/A 06/15/2021   Procedure: UPPER GI ENDOSCOPY;  Surgeon: Johnathan Hausen, MD;  Location: [facility] ORS;  Service: General;  Laterality: N/A;   VASOTOMY      Allergies  Allergies  Allergen Reactions   Other Itching and Rash    Pt was tested and positive for feline, canine and cockroaches. Pt has rash, itching, watery eyes. And is on allergy injections since 2017    History of Present Illness    [name] presents today posthospitalization after being seen on consultation by Dr. Alda Lea on 06/16/2021 in the setting of postoperative new left bundle branch block after undergoing gastric sleeve surgery.  The patient converted to normal sinus rhythm once recovered after arriving to his room.  Echocardiogram was unremarkable.  Consideration for ischemic work-up would be discussed on this office visit.  He does not have a family history of heart disease per review of his record.  His risk factors include hypertension and hypercholesterolemia.  On evaluation today he has lost a total of 65 pounds, feels great, exercises 4 times a week at the gym on a treadmill and lifting weights.  He denies any symptoms whatsoever of chest pain, dyspnea, excessive fatigue, or palpitations.  He states he could tell when he was in the left bundle branch block because he had some chest pressure.  He now feels very well and is very grateful for the physicians during his hospitalization  as well as his response concerning weight loss.  He has been taking his blood pressure at home and it is been lower, corresponding to blood pressure found today in the office.   Home Medications    Current Outpatient Medications  Medication Sig Dispense Refill   albuterol (VENTOLIN HFA) 108 (90 Base) MCG/ACT inhaler Inhale 2 puffs into the lungs every 4 (four) hours as needed for wheezing or shortness of breath.     cholecalciferol (VITAMIN D) 25 MCG (1000 UNIT) tablet Take 1,000 Units  by mouth daily.     EPINEPHrine 0.3 mg/0.3 mL IJ SOAJ injection Inject 0.3 mg into the muscle as needed for anaphylaxis.     esomeprazole (NEXIUM) 40 MG capsule Take 40 mg by mouth at bedtime.     lisinopril-hydrochlorothiazide (ZESTORETIC) 20-12.5 MG tablet TAKE 2 TABLETS BY MOUTH EVERY DAY (Patient taking differently: Take 1 tablet by mouth daily.) 60 tablet 14   montelukast (SINGULAIR) 10 MG tablet Take 10 mg by mouth at bedtime.   11   Multiple Vitamins-Minerals (MULTIVITAMIN WITH MINERALS) tablet Take 1 tablet by mouth daily.     NON FORMULARY Pt uses a cpap nightly     omega-3 acid ethyl esters (LOVAZA) 1 G capsule Take 1 g by mouth daily.     ondansetron (ZOFRAN-ODT) 4 MG disintegrating tablet Dissolve 1 tablet (4 mg total) by mouth every 6 hours as needed for nausea or vomiting. 20 tablet 0   simvastatin (ZOCOR) 10 MG tablet TAKE 1 TABLET BY MOUTH EVERYDAY AT BEDTIME 30 tablet 3   No current facility-administered medications for this visit.     Family History    Family History  Problem Relation Age of Onset   High Cholesterol Mother    Breast cancer Mother 39   Diabetes Father    High blood pressure Father    High Cholesterol Father    Prostate cancer Paternal Grandfather    Cancer Maternal Grandfather        possible stomach cancer   Cancer Maternal Aunt        possible pancreatic cancer   Esophageal cancer Maternal Uncle    Breast cancer Cousin    Ovarian cancer Cousin    He indicated that his mother is deceased. He indicated that his father is alive. He indicated that his sister is alive. He indicated that his maternal grandmother is deceased. He indicated that his maternal grandfather is deceased. He indicated that his paternal grandmother is deceased. He indicated that his paternal grandfather is deceased. He indicated that his son is alive. He indicated that his maternal aunt is deceased. He indicated that his maternal uncle is deceased. He indicated that his paternal  aunt is alive. He indicated that his paternal uncle is deceased. He indicated that both of his cousins are deceased.  Social History    Social History   Socioeconomic History   Marital status: Married    Spouse name: [name]   Number of children: 1   Years of education: 13   Highest education level: Not on file  Occupational History   Occupation: work full time    Comment: [employer]  Tobacco Use   Smoking status: Never   Smokeless tobacco: Never  Vaping Use   Vaping Use: Never used  Substance and Sexual Activity   Alcohol use: Never   Drug use: Never   Sexual activity: Yes  Other Topics Concern   Not on file  Social History Narrative   Patient  lives at home  with his wife [name]).  Patient works full time for [employer].   Education some college.   Right handed.   Caffeine one shot of colombian  coffee.   Social Determinants of Health   Financial Resource Strain: Not on file  Food Insecurity: Not on file  Transportation Needs: Not on file  Physical Activity: Not on file  Stress: Not on file  Social Connections: Not on file  Intimate Partner Violence: Not on file     Review of Systems    General:  No chills, fever, night sweats or weight changes.  Cardiovascular:  No chest pain, dyspnea on exertion, edema, orthopnea, palpitations, paroxysmal nocturnal dyspnea. Dermatological: No rash, lesions/masses Respiratory: No cough, dyspnea Urologic: No hematuria, dysuria Abdominal:   No nausea, vomiting, diarrhea, bright red blood per rectum, melena, or hematemesis Neurologic:  No visual changes, wkns, changes in mental status. All other systems reviewed and are otherwise negative except as noted above.     Physical Exam    VS:  BP 104/60 (BP Location: Left Arm, Patient Position: Sitting, Cuff Size: Large)    Pulse 64    Ht '5\' 11"'  (1.803 m)    Wt 248 lb (112.5 kg)    BMI 34.59 kg/m  , BMI Body mass index is 34.59 kg/m.     GEN: Well nourished,  well developed, in no acute distress. HEENT: normal. Neck: Supple, no JVD, carotid bruits, or masses. Cardiac: RRR,, no murmurs, S4 murmur noted, rubs, or gallops. No clubbing, cyanosis, edema.  Radials/DP/PT 2+ and equal bilaterally.  Respiratory:  Respirations regular and unlabored, clear to auscultation bilaterally. GI: Soft, nontender, nondistended, BS + x 4. MS: no deformity or atrophy. Skin: warm and dry, no rash. Neuro:  Strength and sensation are intact. Psych: Normal affect.  Accessory Clinical Findings    ECG personally reviewed by me today-normal sinus rhythm, rate of 64 bpm, with left axis deviation, T wave inversion noted in V2 and V3.  Left bundle branch block on previous EKG, November 2022.  Lab Results  Component Value Date   WBC 13.0 (H) 06/16/2021   HGB 14.2 06/16/2021   HCT 42.6 06/16/2021   MCV 80.5 06/16/2021   PLT 255 06/16/2021   Lab Results  Component Value Date   CREATININE 1.27 (H) 06/15/2021   BUN 14 05/31/2021   NA 136 05/31/2021   K 4.1 05/31/2021   CL 105 05/31/2021   CO2 24 05/31/2021   Lab Results  Component Value Date   ALT 49 (H) 05/31/2021   AST 35 05/31/2021   ALKPHOS 44 05/31/2021   BILITOT 0.5 05/31/2021   Lab Results  Component Value Date   CHOL 216 (H) 11/18/2020   HDL 47 11/18/2020   LDLCALC 135 (H) 11/18/2020   TRIG 190 (H) 11/18/2020    Lab Results  Component Value Date   HGBA1C 6.2 (H) 05/31/2021    Review of Prior Studies: Limited Echo 06/15/21 1. Left ventricular ejection fraction, by estimation, is 55 to 60%. The  left ventricle has normal function.   2. Right ventricular systolic function is normal. The right ventricular  size is normal.   3. The mitral valve is normal in structure. Trivial mitral valve  regurgitation.   4. The aortic valve is normal in structure. Aortic valve regurgitation is  not visualized.   Comparison(s): The left ventricular function is unchanged.    Echo 04/28/21 1. Left ventricular  ejection fraction, by estimation, is  55 to 60%. The  left ventricle has normal function. The left ventricle has no regional  wall motion abnormalities. Left ventricular diastolic parameters were  normal. The average left ventricular  global longitudinal strain is -17.2 %. The global longitudinal strain is  normal.   2. Right ventricular systolic function is normal. The right ventricular  size is normal.   3. The mitral valve is normal in structure. No evidence of mitral valve  regurgitation.   4. The aortic valve is tricuspid. Aortic valve regurgitation is not  visualized.   Assessment & Plan   1.  Left under branch block: Patient currently in normal sinus rhythm, EKG reveals ST inversion noted in anteriorly.  He denies any symptoms of shortness of breath, chest pain, or dizziness.  He works out 4 times a week on a treadmill along with weight lifting.  He has lost 65 pounds since gastric sleeve surgery and feels great.  I will not plan any ischemic testing as he is completely asymptomatic.  Risk factors include history hypertension and hypercholesterolemia.  2.  Hypertension: He is currently on lisinopril 20/12.5 mg daily.  I will reduce the dose to 10 mg daily of lisinopril.  He wants to just cut his current medications in half and therefore he will be on 10/6.25 mg of lisinopril.  He is asked to take his blood pressure daily and let us know if his blood pressures greater than 140/60 or less than 100/60.  I am repeating a CMET.  3.  Hypercholesterolemia: Most recent labs November 18, 2020 revealed total cholesterol 216, LDL of 135, HDL of 47.  I am repeating fasting lipids since he is lost so much weight to evaluate his status for risk management.  4.  Morbid obesity: Status post gastric sleeve surgery November 2022 with 65 pound weight loss.  He is adhering to his diet, and remains very active going to the gym 4 days a week.  He is very motivated to continue a healthy lifestyle and is grateful to  the physicians who have helped him in this endeavor.    Current medicines are reviewed at length with the patient today.  I have spent 25 min's  dedicated to the care of this patient on the date of this encounter to include pre-visit review of records, assessment, management and diagnostic testing,with shared decision making. Signed, Phill Myron. West Pugh, ANP, AACC   10/01/2021 10:50 AM    [address] Office [phone] Fax [phone]  Notice: This dictation was prepared with Dragon dictation along with smaller phrase technology. Any transcriptional errors that result from this process are unintentional and may not be corrected upon review.

## 2021-10-01 ENCOUNTER — Encounter: Payer: Self-pay | Admitting: Adult Health

## 2021-10-01 ENCOUNTER — Ambulatory Visit: Payer: 59 | Admitting: Adult Health

## 2021-10-01 ENCOUNTER — Other Ambulatory Visit: Payer: Self-pay

## 2021-10-01 DIAGNOSIS — Z0000 Encounter for general adult medical examination without abnormal findings: Secondary | ICD-10-CM | POA: Diagnosis not present

## 2021-10-01 NOTE — Patient Instructions (Signed)
Medication Instructions:  Decrease Lisinopril-HCTZ to ( 0.5 Tablet Daily). *If you need a refill on your cardiac medications before your next appointment, please call your pharmacy*   Lab Work: CMET Today Lipids : 82month If you have labs (blood work) drawn today and your tests are completely normal, you will receive your results only by: MyChart Message (if you have MyChart) OR A paper copy in the mail If you have any lab test that is abnormal or we need to change your treatment, we will call you to review the results.   Testing/Procedures: No Testing   Follow-Up: At [facility], you and your health needs are our priority.  As part of our continuing mission to provide you with exceptional heart care, we have created designated Provider Care Teams.  These Care Teams include your primary Cardiologist (physician) and Advanced Practice Providers (APPs -  Physician Assistants and Nurse Practitioners) who all work together to provide you with the care you need, when you need it.  We recommend signing up for the patient portal called "MyChart".  Sign up information is provided on this After Visit Summary.  MyChart is used to connect with patients for Virtual Visits (Telemedicine).  Patients are able to view lab/test results, encounter notes, upcoming appointments, etc.  Non-urgent messages can be sent to your provider as well.   To learn more about what you can do with MyChart, go to [email or web address].    Your next appointment:   6 month(s)  The format for your next appointment:   In Person  Provider:   Little Ishikawa, MD

## 2021-10-02 LAB — COMPREHENSIVE METABOLIC PANEL
ALT: 27 IU/L (ref 0–44)
AST: 25 IU/L (ref 0–40)
Albumin/Globulin Ratio: 1.7 (ref 1.2–2.2)
Albumin: 4.5 g/dL (ref 4.0–5.0)
Alkaline Phosphatase: 40 IU/L — ABNORMAL LOW (ref 44–121)
BUN/Creatinine Ratio: 18 (ref 9–20)
BUN: 21 mg/dL (ref 6–24)
Bilirubin Total: 0.4 mg/dL (ref 0.0–1.2)
CO2: 21 mmol/L (ref 20–29)
Calcium: 9.8 mg/dL (ref 8.7–10.2)
Chloride: 100 mmol/L (ref 96–106)
Creatinine, Ser: 1.19 mg/dL (ref 0.76–1.27)
Globulin, Total: 2.7 g/dL (ref 1.5–4.5)
Glucose: 69 mg/dL — ABNORMAL LOW (ref 70–99)
Potassium: 4.8 mmol/L (ref 3.5–5.2)
Sodium: 142 mmol/L (ref 134–144)
Total Protein: 7.2 g/dL (ref 6.0–8.5)
eGFR: 74 mL/min/{1.73_m2} (ref >59)

## 2021-10-06 ENCOUNTER — Telehealth: Payer: Self-pay

## 2021-10-06 NOTE — Telephone Encounter (Addendum)
Called patient regarding results. Left message for patient to call office.----- Message from Jodelle Gross, NP sent at 10/04/2021  3:39 PM EST ----- Waiting on all labs before commenting. What I am seeing now is stable.

## 2021-10-07 ENCOUNTER — Telehealth: Payer: Self-pay

## 2021-10-07 NOTE — Telephone Encounter (Addendum)
Called patient regarding results. Patient had understanding of results.----- Message from Jodelle Gross, NP sent at 10/04/2021  3:39 PM EST ----- Waiting on all labs before commenting. What I am seeing now is stable.

## 2021-10-27 LAB — LIPID PANEL
Chol/HDL Ratio: 5.2 ratio — ABNORMAL HIGH (ref 0.0–5.0)
Cholesterol, Total: 245 mg/dL — ABNORMAL HIGH (ref 100–199)
HDL: 47 mg/dL (ref >39)
LDL Chol Calc (NIH): 162 mg/dL — ABNORMAL HIGH (ref 0–99)
Triglycerides: 195 mg/dL — ABNORMAL HIGH (ref 0–149)
VLDL Cholesterol Cal: 36 mg/dL (ref 5–40)

## 2021-10-29 ENCOUNTER — Telehealth: Payer: Self-pay

## 2021-10-29 NOTE — Telephone Encounter (Addendum)
Called patient regarding results left message for patient to call office.----- Message from Lendon Colonel, NP sent at 10/28/2021 12:09 PM EDT ----- ?The labs have been reviewed. Please repeat them FASTING, as the numbers are higher than previous and want to make sure he had not eaten. If still elevated, will need to increase simvastatin dose.   ?

## 2021-11-03 ENCOUNTER — Telehealth: Payer: Self-pay

## 2021-11-03 NOTE — Telephone Encounter (Addendum)
Called patient left message . ----- Message from Jodelle Gross, NP sent at 10/28/2021 12:09 PM EDT ----- ?The labs have been reviewed. Please repeat them FASTING, as the numbers are higher than previous and want to make sure he had not eaten. If still elevated, will need to increase simvastatin dose.   ?

## 2021-11-27 ENCOUNTER — Other Ambulatory Visit: Payer: Self-pay | Admitting: Internal Medicine

## 2021-12-16 ENCOUNTER — Encounter: Payer: Self-pay | Admitting: Nurse Practitioner

## 2021-12-16 ENCOUNTER — Ambulatory Visit: Payer: 59 | Admitting: Nurse Practitioner

## 2021-12-16 ENCOUNTER — Encounter: Payer: Self-pay | Admitting: Oncology

## 2021-12-16 VITALS — BP 140/84 | HR 66 | Ht 71.0 in | Wt 240.6 lb

## 2021-12-16 DIAGNOSIS — Z6833 Body mass index (BMI) 33.0-33.9, adult: Secondary | ICD-10-CM | POA: Diagnosis not present

## 2021-12-16 DIAGNOSIS — E669 Obesity, unspecified: Secondary | ICD-10-CM | POA: Insufficient documentation

## 2021-12-16 DIAGNOSIS — T7840XA Allergy, unspecified, initial encounter: Secondary | ICD-10-CM

## 2021-12-16 DIAGNOSIS — I10 Essential (primary) hypertension: Secondary | ICD-10-CM

## 2021-12-16 MED ORDER — METHYLPREDNISOLONE 4 MG PO TBPK: ORAL_TABLET | ORAL | 0 refills | Status: DC

## 2021-12-16 NOTE — Assessment & Plan Note (Signed)
Started him on Medrol dose pack. ?Advised him to continue benadryl. ?Apply cold pack on the eyes. ?

## 2021-12-16 NOTE — Progress Notes (Signed)
? ?Established Patient Office Visit ? ?Subjective:  ?Patient ID: Christopher Burns, male    DOB: [birth year]  Age: 51 y.o. MRN: [id number] ? ?CC:  ?Chief Complaint  ?Patient presents with  ? Allergic Reaction  ?  Patient having right eye swelling, redness, and draining. Patient also had what he thought to be a pimple on his lip and he popped it and now lip is also swollen. Patient first noticed eye on Monday. Patient states that he has not changed anything.   ? ? ? ?HPI ? ?Christopher Burns presents for swelling in th eyes. He has been to a fair on Sunday. He ate corn and pita. Patient states he saw some swelling on Sunday evening. Since that she is putting lubricating drops OTC and taking benadryl. Now he has a pimple on the upper lips since yesterday with upper lip swelling. ? ?Allergic Reaction ?This is a new problem. The current episode started 3 to 5 days ago. The problem is unchanged. The problem is mild. It is unknown what he was exposed to. Associated symptoms include eye watering. Pertinent negatives include no chest pain, coughing, eye itching, eye redness, vomiting or wheezing. Swelling is present on the eyes and lips. Past treatments include diphenhydramine. The treatment provided mild relief.   ? ?Past Medical History:  ?Diagnosis Date  ? Asthma   ? Dizziness   ? Family history of BRCA1 gene positive   ? Family history of breast cancer   ? Family history of ovarian cancer   ? Family history of prostate cancer   ? GERD (gastroesophageal reflux disease)   ? Hemorrhoid   ? High blood pressure   ? High cholesterol   ? Hx of blood clots   ? Hypercholesteremia   ? Polycythemia   ? Pre-diabetes   ? Sleep apnea   ? cpap  ? ? ?Past Surgical History:  ?Procedure Laterality Date  ? bisep Left 2007  ? COLONOSCOPY WITH PROPOFOL N/A 03/01/2019  ? Procedure: COLONOSCOPY WITH PROPOFOL;  Surgeon: Jonathon Bellows, MD;  Location: [facility] ENDOSCOPY;  Service: Gastroenterology;  Laterality: N/A;  ? KNEE SURGERY Right   ? LASIK    ?  PULMONARY THROMBECTOMY Bilateral 10/23/2020  ? Procedure: PULMONARY THROMBECTOMY;  Surgeon: Algernon Huxley, MD;  Location: [place] [facility];  Service: Cardiovascular;  Laterality: Bilateral;  ? TONSILLECTOMY    ? UMBILICAL HERNIA REPAIR N/A 07/29/2015  ? Procedure: HERNIA REPAIR UMBILICAL ADULT;  Surgeon: Christene Lye, MD;  Location: [facility] ORS;  Service: General;  Laterality: N/A;  ? UPPER GI ENDOSCOPY N/A 06/15/2021  ? Procedure: UPPER GI ENDOSCOPY;  Surgeon: Johnathan Hausen, MD;  Location: [facility] ORS;  Service: General;  Laterality: N/A;  ? VASOTOMY    ? ? ?Family History  ?Problem Relation Age of Onset  ? High Cholesterol Mother   ? Breast cancer Mother 62  ? Diabetes Father   ? High blood pressure Father   ? High Cholesterol Father   ? Prostate cancer Paternal Grandfather   ? Cancer Maternal Grandfather   ?     possible stomach cancer  ? Cancer Maternal Aunt   ?     possible pancreatic cancer  ? Esophageal cancer Maternal Uncle   ? Breast cancer Cousin   ? Ovarian cancer Cousin   ? ? ?Social History  ? ?Socioeconomic History  ? Marital status: Married  ?  Spouse name: [name]  ? Number of children: 1  ? Years of education: 43  ? Highest  education level: Not on file  ?Occupational History  ? Occupation: work full time  ?  Comment: [employer]  ?Tobacco Use  ? Smoking status: Never  ? Smokeless tobacco: Never  ?Vaping Use  ? Vaping Use: Never used  ?Substance and Sexual Activity  ? Alcohol use: Never  ? Drug use: Never  ? Sexual activity: Yes  ?Other Topics Concern  ? Not on file  ?Social History Narrative  ? Patient lives at home  with his wife [name]).  Patient works full time for [employer].  ? Education some college.  ? Right handed.  ? Caffeine one shot of [place]  coffee.  ? ?Social Determinants of Health  ? ?Financial Resource Strain: Not on file  ?Food Insecurity: Not on file  ?Transportation Needs: Not on file  ?Physical Activity: Not on file  ?Stress: Not on file  ?Social  Connections: Not on file  ?Intimate Partner Violence: Not on file  ? ? ? ?Outpatient Medications Prior to Visit  ?Medication Sig Dispense Refill  ? albuterol (VENTOLIN HFA) 108 (90 Base) MCG/ACT inhaler Inhale 2 puffs into the lungs every 4 (four) hours as needed for wheezing or shortness of breath.    ? cholecalciferol (VITAMIN D) 25 MCG (1000 UNIT) tablet Take 1,000 Units by mouth daily.    ? EPINEPHrine 0.3 mg/0.3 mL IJ SOAJ injection Inject 0.3 mg into the muscle as needed for anaphylaxis.    ? esomeprazole (NEXIUM) 40 MG capsule Take 40 mg by mouth at bedtime.    ? lisinopril-hydrochlorothiazide (ZESTORETIC) 20-12.5 MG tablet TAKE 2 TABLETS BY MOUTH EVERY DAY (Patient taking differently: Take 0.5 tablets by mouth daily. Take 0.5 Tablets Daily.) 60 tablet 14  ? montelukast (SINGULAIR) 10 MG tablet Take 10 mg by mouth at bedtime.   11  ? Multiple Vitamins-Minerals (MULTIVITAMIN WITH MINERALS) tablet Take 1 tablet by mouth daily.    ? NON FORMULARY Pt uses a cpap nightly    ? omega-3 acid ethyl esters (LOVAZA) 1 G capsule Take 1 g by mouth daily.    ? ondansetron (ZOFRAN-ODT) 4 MG disintegrating tablet Dissolve 1 tablet (4 mg total) by mouth every 6 hours as needed for nausea or vomiting. 20 tablet 0  ? simvastatin (ZOCOR) 10 MG tablet TAKE 1 TABLET BY MOUTH EVERYDAY AT BEDTIME 30 tablet 3  ? ?No facility-administered medications prior to visit.  ? ? ?Allergies  ?Allergen Reactions  ? Other Itching and Rash  ?  Pt was tested and positive for feline, canine and cockroaches. Pt has rash, itching, watery eyes. And is on allergy injections since 2017  ? ? ?ROS ?Review of Systems  ?Constitutional:  Negative for activity change, appetite change and fever.  ?HENT:  Negative for congestion, mouth sores and sore throat.   ?     Swelling of upper lip  ?Eyes:  Negative for redness and itching.  ?     Watery eyes  ?Respiratory:  Negative for apnea, cough, shortness of breath and wheezing.   ?Cardiovascular:  Negative for  chest pain and palpitations.  ?Gastrointestinal:  Negative for abdominal distention, blood in stool and vomiting.  ?Genitourinary:  Negative for difficulty urinating and frequency.  ?Musculoskeletal:  Negative for arthralgias and myalgias.  ?Skin:  Negative for color change and pallor.  ?Neurological:  Negative for dizziness, facial asymmetry, light-headedness and headaches.  ?Psychiatric/Behavioral:  Negative for agitation, behavioral problems, confusion and decreased concentration.   ? ?  ?Objective:  ?  ?Physical  Exam ?Constitutional:   ?   Appearance: He is obese.  ?HENT:  ?   Head: Normocephalic and atraumatic.  ?   Right Ear: Tympanic membrane normal.  ?   Left Ear: Tympanic membrane normal.  ?   Nose: Nose normal.  ?   Mouth/Throat:  ?   Mouth: Mucous membranes are moist.  ?   Pharynx: Oropharynx is clear.  ?Eyes:  ?   Extraocular Movements: Extraocular movements intact.  ?   Conjunctiva/sclera: Conjunctivae normal.  ?   Pupils: Pupils are equal, round, and reactive to light.  ? ?   Comments: Swelling around the lower and upper eyelid.  ?Cardiovascular:  ?   Rate and Rhythm: Normal rate and regular rhythm.  ?   Pulses: Normal pulses.  ?   Heart sounds: Normal heart sounds.  ?Pulmonary:  ?   Effort: Pulmonary effort is normal.  ?   Breath sounds: Normal breath sounds.  ?Abdominal:  ?   General: Bowel sounds are normal.  ?   Palpations: Abdomen is soft.  ?Musculoskeletal:     ?   General: Normal range of motion.  ?   Cervical back: Normal range of motion.  ?Skin: ?   General: Skin is warm.  ?   Capillary Refill: Capillary refill takes less than 2 seconds.  ?Neurological:  ?   General: No focal deficit present.  ?   Mental Status: He is alert and oriented to person, place, and time. Mental status is at baseline.  ?Psychiatric:     ?   Mood and Affect: Mood normal.     ?   Behavior: Behavior normal.     ?   Thought Content: Thought content normal.     ?   Judgment: Judgment normal.  ? ? ?BP 140/84   Pulse 66    Ht '5\' 11"'  (1.803 m)   Wt 240 lb 9.6 oz (109.1 kg)   BMI 33.56 kg/m?  ?Wt Readings from Last 3 Encounters:  ?12/16/21 240 lb 9.6 oz (109.1 kg)  ?10/01/21 248 lb (112.5 kg)  ?09/27/21 247 lb 4.8 oz (112.2 kg)  ? ? ? ?Health

## 2021-12-16 NOTE — Assessment & Plan Note (Signed)
BMI 33.56  ?Advised pt to lose weight. ?Advised patient to avoid trans fat, fatty and fried food. ?Follow a regular physical activity schedule. ?Went over the risk of chronic diseases with increased weight.   ? ? ? ?

## 2021-12-16 NOTE — Assessment & Plan Note (Signed)
BP 140/84 in the office today. ?Continue the current medication. ? ? ?

## 2021-12-17 ENCOUNTER — Ambulatory Visit (INDEPENDENT_AMBULATORY_CARE_PROVIDER_SITE_OTHER): Payer: 59 | Admitting: Nurse Practitioner

## 2021-12-17 ENCOUNTER — Encounter: Payer: Self-pay | Admitting: Nurse Practitioner

## 2021-12-17 VITALS — BP 138/90 | Temp 96.7°F | Ht 71.0 in | Wt 240.0 lb

## 2021-12-17 DIAGNOSIS — R220 Localized swelling, mass and lump, head: Secondary | ICD-10-CM

## 2021-12-17 MED ORDER — HYDROXYZINE PAMOATE 25 MG PO CAPS: 25.0000 mg | ORAL_CAPSULE | Freq: Three times a day (TID) | ORAL | 0 refills | Status: DC | PRN

## 2021-12-17 MED ORDER — AMLODIPINE BESYLATE 5 MG PO TABS: 5.0000 mg | ORAL_TABLET | Freq: Every day | ORAL | 1 refills | Status: DC

## 2021-12-17 NOTE — Progress Notes (Signed)
? ?Established Patient Office Visit ? ?Subjective:  ?Patient ID: Christopher Burns, male    DOB: 1971-06-29  Age: 51 y.o. MRN: [id number] ? ?CC: No chief complaint on file. ? ? ? ?HPI ? ?Christopher Burns presents for swelling on the right sides of the upper lip. The eye swelling is better now.  ? ? ?HPI  ? ?Past Medical History:  ?Diagnosis Date  ? Asthma   ? Dizziness   ? Family history of BRCA1 gene positive   ? Family history of breast cancer   ? Family history of ovarian cancer   ? Family history of prostate cancer   ? GERD (gastroesophageal reflux disease)   ? Hemorrhoid   ? High blood pressure   ? High cholesterol   ? Hx of blood clots   ? Hypercholesteremia   ? Polycythemia   ? Pre-diabetes   ? Sleep apnea   ? cpap  ? ? ?Past Surgical History:  ?Procedure Laterality Date  ? bisep Left 2007  ? COLONOSCOPY WITH PROPOFOL N/A 03/01/2019  ? Procedure: COLONOSCOPY WITH PROPOFOL;  Surgeon: Jonathon Bellows, MD;  Location: [facility] ENDOSCOPY;  Service: Gastroenterology;  Laterality: N/A;  ? KNEE SURGERY Right   ? LASIK    ? PULMONARY THROMBECTOMY Bilateral 10/23/2020  ? Procedure: PULMONARY THROMBECTOMY;  Surgeon: Algernon Huxley, MD;  Location: [place] [facility];  Service: Cardiovascular;  Laterality: Bilateral;  ? TONSILLECTOMY    ? UMBILICAL HERNIA REPAIR N/A 07/29/2015  ? Procedure: HERNIA REPAIR UMBILICAL ADULT;  Surgeon: Christene Lye, MD;  Location: [facility] ORS;  Service: General;  Laterality: N/A;  ? UPPER GI ENDOSCOPY N/A 06/15/2021  ? Procedure: UPPER GI ENDOSCOPY;  Surgeon: Johnathan Hausen, MD;  Location: [facility] ORS;  Service: General;  Laterality: N/A;  ? VASOTOMY    ? ? ?Family History  ?Problem Relation Age of Onset  ? High Cholesterol Mother   ? Breast cancer Mother 18  ? Diabetes Father   ? High blood pressure Father   ? High Cholesterol Father   ? Prostate cancer Paternal Grandfather   ? Cancer Maternal Grandfather   ?     possible stomach cancer  ? Cancer Maternal Aunt   ?     possible pancreatic cancer  ?  Esophageal cancer Maternal Uncle   ? Breast cancer Cousin   ? Ovarian cancer Cousin   ? ? ?Social History  ? ?Socioeconomic History  ? Marital status: Married  ?  Spouse name: [name]  ? Number of children: 1  ? Years of education: 28  ? Highest education level: Not on file  ?Occupational History  ? Occupation: work full time  ?  Comment: [employer]  ?Tobacco Use  ? Smoking status: Never  ? Smokeless tobacco: Never  ?Vaping Use  ? Vaping Use: Never used  ?Substance and Sexual Activity  ? Alcohol use: Never  ? Drug use: Never  ? Sexual activity: Yes  ?Other Topics Concern  ? Not on file  ?Social History Narrative  ? Patient lives at home  with his wife [name]).  Patient works full time for [employer].  ? Education some college.  ? Right handed.  ? Caffeine one shot of [place]  coffee.  ? ?Social Determinants of Health  ? ?Financial Resource Strain: Not on file  ?Food Insecurity: Not on file  ?Transportation Needs: Not on file  ?Physical Activity: Not on file  ?Stress: Not on file  ?Social Connections: Not on file  ?  Intimate Partner Violence: Not on file  ? ? ? ?Outpatient Medications Prior to Visit  ?Medication Sig Dispense Refill  ? albuterol (VENTOLIN HFA) 108 (90 Base) MCG/ACT inhaler Inhale 2 puffs into the lungs every 4 (four) hours as needed for wheezing or shortness of breath.    ? cholecalciferol (VITAMIN D) 25 MCG (1000 UNIT) tablet Take 1,000 Units by mouth daily.    ? EPINEPHrine 0.3 mg/0.3 mL IJ SOAJ injection Inject 0.3 mg into the muscle as needed for anaphylaxis. (Patient not taking: Reported on 12/19/2021)    ? esomeprazole (NEXIUM) 40 MG capsule Take 40 mg by mouth at bedtime.    ? methylPREDNISolone (MEDROL DOSEPAK) 4 MG TBPK tablet As directed 21 each 0  ? montelukast (SINGULAIR) 10 MG tablet Take 10 mg by mouth at bedtime.   11  ? Multiple Vitamins-Minerals (MULTIVITAMIN WITH MINERALS) tablet Take 1 tablet by mouth daily.    ? NON FORMULARY Pt uses a cpap nightly    ? omega-3  acid ethyl esters (LOVAZA) 1 G capsule Take 1 g by mouth daily.    ? ondansetron (ZOFRAN-ODT) 4 MG disintegrating tablet Dissolve 1 tablet (4 mg total) by mouth every 6 hours as needed for nausea or vomiting. 20 tablet 0  ? simvastatin (ZOCOR) 10 MG tablet TAKE 1 TABLET BY MOUTH EVERYDAY AT BEDTIME 30 tablet 3  ? lisinopril-hydrochlorothiazide (ZESTORETIC) 20-12.5 MG tablet TAKE 2 TABLETS BY MOUTH EVERY DAY (Patient taking differently: Take 0.5 tablets by mouth daily. Take 0.5 Tablets Daily.) 60 tablet 14  ? ?No facility-administered medications prior to visit.  ? ? ?Allergies  ?Allergen Reactions  ? Lisinopril Swelling  ?  Angioedema   ? Other Itching and Rash  ?  Pt was tested and positive for feline, canine and cockroaches. Pt has rash, itching, watery eyes. And is on allergy injections since 2017  ? ? ?ROS ?Review of Systems  ?Constitutional:  Negative for activity change and appetite change.  ?HENT:  Positive for facial swelling (Swelling on the right side of upper lip.). Negative for congestion and ear discharge.   ?Eyes:  Negative for discharge and redness.  ?Respiratory:  Negative for apnea, shortness of breath and wheezing.   ?Cardiovascular:  Negative for chest pain and palpitations.  ?Gastrointestinal:  Negative for abdominal distention, blood in stool and rectal pain.  ?Genitourinary:  Negative for difficulty urinating and frequency.  ?Musculoskeletal:  Negative for arthralgias and joint swelling.  ?Skin:  Negative for color change and pallor.  ?Neurological:  Negative for dizziness, facial asymmetry and headaches.  ?Psychiatric/Behavioral:  Negative for agitation and behavioral problems.   ? ?  ?Objective:  ?  ?Physical Exam ?Constitutional:   ?   Appearance: Normal appearance. He is obese.  ?HENT:  ?   Head: Normocephalic.  ?   Right Ear: Tympanic membrane normal.  ?   Left Ear: Tympanic membrane normal.  ?   Nose: Nose normal.  ?   Mouth/Throat:  ?   Lips: Pink. No lesions (swelling on the upper lip).   ?   Mouth: Mucous membranes are moist.  ?Eyes:  ?   Extraocular Movements: Extraocular movements intact.  ?   Conjunctiva/sclera: Conjunctivae normal.  ?   Pupils: Pupils are equal, round, and reactive to light.  ?Cardiovascular:  ?   Rate and Rhythm: Normal rate and regular rhythm.  ?   Pulses: Normal pulses.  ?   Heart sounds: Normal heart sounds.  ?Pulmonary:  ?   Effort: Pulmonary  effort is normal.  ?   Breath sounds: Normal breath sounds.  ?Abdominal:  ?   General: Bowel sounds are normal.  ?   Palpations: Abdomen is soft.  ?Musculoskeletal:     ?   General: Normal range of motion.  ?Skin: ?   General: Skin is warm.  ?   Capillary Refill: Capillary refill takes less than 2 seconds.  ?Neurological:  ?   General: No focal deficit present.  ?   Mental Status: He is alert and oriented to person, place, and time. Mental status is at baseline.  ?Psychiatric:     ?   Mood and Affect: Mood normal.     ?   Behavior: Behavior normal.     ?   Thought Content: Thought content normal.     ?   Judgment: Judgment normal.  ? ? ?BP 138/90   Temp (!) 96.7 ?F (35.9 ?C)  ?Wt Readings from Last 3 Encounters:  ?12/19/21 240 lb 8.4 oz (109.1 kg)  ?12/18/21 240 lb 8.4 oz (109.1 kg)  ?12/16/21 240 lb 9.6 oz (109.1 kg)  ? ? ? ?Health Maintenance Due  ?Topic Date Due  ? Hepatitis C Screening  Never done  ? COVID-19 Vaccine (3 - Booster for Pfizer series) 01/07/2020  ? Zoster Vaccines- Shingrix (1 of 2) Never done  ? ? ?There are no preventive care reminders to display for this patient. ? ?Lab Results  ?Component Value Date  ? TSH 1.270 11/18/2020  ? ?Lab Results  ?Component Value Date  ? WBC 17.5 (H) 12/19/2021  ? HGB 13.6 12/19/2021  ? HCT 42.4 12/19/2021  ? MCV 82.3 12/19/2021  ? PLT 277 12/19/2021  ? ?Lab Results  ?Component Value Date  ? NA 138 12/19/2021  ? K 4.0 12/19/2021  ? CO2 26 12/19/2021  ? GLUCOSE 115 (H) 12/19/2021  ? BUN 31 (H) 12/19/2021  ? CREATININE 1.12 12/19/2021  ? BILITOT 0.4 10/01/2021  ? ALKPHOS 40 (L)  10/01/2021  ? AST 25 10/01/2021  ? ALT 27 10/01/2021  ? PROT 7.2 10/01/2021  ? ALBUMIN 4.5 10/01/2021  ? CALCIUM 9.3 12/19/2021  ? ANIONGAP 9 12/19/2021  ? EGFR 74 10/01/2021  ? ?Lab Results  ?Component Value Date  ? CHO

## 2021-12-18 ENCOUNTER — Emergency Department
Admission: EM | Admit: 2021-12-18 | Discharge: 2021-12-18 | Disposition: A | Discharge status: Home / Self Care | Payer: 59 | Attending: Emergency Medicine | Admitting: Emergency Medicine

## 2021-12-18 ENCOUNTER — Other Ambulatory Visit: Payer: Self-pay

## 2021-12-18 DIAGNOSIS — I10 Essential (primary) hypertension: Secondary | ICD-10-CM | POA: Insufficient documentation

## 2021-12-18 DIAGNOSIS — Z79899 Other long term (current) drug therapy: Secondary | ICD-10-CM | POA: Insufficient documentation

## 2021-12-18 DIAGNOSIS — R220 Localized swelling, mass and lump, head: Secondary | ICD-10-CM | POA: Diagnosis present

## 2021-12-18 DIAGNOSIS — T7840XA Allergy, unspecified, initial encounter: Secondary | ICD-10-CM

## 2021-12-18 DIAGNOSIS — T783XXA Angioneurotic edema, initial encounter: Secondary | ICD-10-CM | POA: Diagnosis not present

## 2021-12-18 DIAGNOSIS — Z7901 Long term (current) use of anticoagulants: Secondary | ICD-10-CM | POA: Insufficient documentation

## 2021-12-18 DIAGNOSIS — J45909 Unspecified asthma, uncomplicated: Secondary | ICD-10-CM | POA: Diagnosis not present

## 2021-12-18 MED ORDER — EPINEPHRINE 0.3 MG/0.3ML IJ SOAJ
0.3000 mg | Freq: Once | INTRAMUSCULAR | Status: AC
  Administered 2021-12-18: 0.3 mg via INTRAMUSCULAR
  Filled 2021-12-18: qty 0.3

## 2021-12-18 MED ORDER — SODIUM CHLORIDE 0.9 % IV BOLUS
1000.0000 mL | Freq: Once | INTRAVENOUS | Status: AC
  Administered 2021-12-18: 1000 mL via INTRAVENOUS

## 2021-12-18 MED ORDER — FAMOTIDINE IN NACL 20-0.9 MG/50ML-% IV SOLN
20.0000 mg | Freq: Once | INTRAVENOUS | Status: AC
Start: 2021-12-18 — End: 2021-12-18
  Administered 2021-12-18: 20 mg via INTRAVENOUS
  Filled 2021-12-18: qty 50

## 2021-12-18 MED ORDER — METHYLPREDNISOLONE SODIUM SUCC 125 MG IJ SOLR
125.0000 mg | Freq: Once | INTRAMUSCULAR | Status: AC
  Administered 2021-12-18: 125 mg via INTRAVENOUS
  Filled 2021-12-18: qty 2

## 2021-12-18 MED ORDER — DIPHENHYDRAMINE HCL 50 MG/ML IJ SOLN
25.0000 mg | Freq: Once | INTRAMUSCULAR | Status: AC
  Administered 2021-12-18: 25 mg via INTRAVENOUS
  Filled 2021-12-18: qty 1

## 2021-12-18 NOTE — ED Notes (Signed)
Ambulatory to restroom, steady gait, NAD noted ?

## 2021-12-18 NOTE — ED Triage Notes (Signed)
Pt states that he noticed swelling of his lips Monday. Went to the doctor on Thursday and started on a steroid. Pt was taken off his lisinopril and given a steroid shot on Friday. Pt denies any issues swallowing or breathing at this time. ?

## 2021-12-18 NOTE — ED Provider Notes (Signed)
? ?[facility] ?Provider Note ? ? ? Event Date/Time  ? First MD Initiated Contact with Patient 12/18/21 [phone]   ?  (approximate) ? ? ?History  ? ?Facial Swelling ? ? ?HPI ? ?Christopher Burns is a 51 y.o. male who presents to the ED from home with a chief complaint of lip swelling.  Patient first noticed swelling to his right upper lip on Monday.  Saw his PCP on Thursday for right eye swelling and progressive upper lip swelling and started on a steroid.  Had a pimple on his upper lip that he popped and thought swelling was due to that.  He was taken off of his lisinopril and given a steroid shot on Friday.  Reports worsening upper lip swelling; unable to sleep tonight.  Denies associated rash, chest pain, shortness of breath, abdominal pain, nausea, vomiting or diarrhea.  Denies other new exposures, foods, medications, etc. Takes weekly allergy shots since 2017 on Mondays for allergies to feline, canine and cockroaches. ?  ? ? ?Past Medical History  ? ?Past Medical History:  ?Diagnosis Date  ? Asthma   ? Dizziness   ? Family history of BRCA1 gene positive   ? Family history of breast cancer   ? Family history of ovarian cancer   ? Family history of prostate cancer   ? GERD (gastroesophageal reflux disease)   ? Hemorrhoid   ? High blood pressure   ? High cholesterol   ? Hx of blood clots   ? Hypercholesteremia   ? Polycythemia   ? Pre-diabetes   ? Sleep apnea   ? cpap  ? ? ? ?Active Problem List  ? ?Patient Active Problem List  ? Diagnosis Date Noted  ? Allergic reaction 12/16/2021  ? Class 1 obesity with serious comorbidity and body mass index (BMI) of 33.0 to 33.9 in adult 12/16/2021  ? S/P laparoscopic sleeve gastrectomy 06/15/2021  ? LBBB (left bundle branch block)   ? OSA on CPAP 05/31/2021  ? CPAP use counseling 05/31/2021  ? Morbid obesity ([place]) 05/31/2021  ? BRCA1 gene mutation positive 01/12/2021  ? Genetic testing 01/12/2021  ? Family history of BRCA1 gene positive   ? Family history of  breast cancer   ? Family history of ovarian cancer   ? Family history of prostate cancer   ? Prediabetes 11/19/2020  ? Flatulence, eructation and gas pain 11/03/2020  ? Acid reflux 11/03/2020  ? Hiccoughs 11/03/2020  ? Proctalgia 11/03/2020  ? Thrombosed external hemorrhoids 11/03/2020  ? Hospital discharge follow-up 10/30/2020  ? Long term current use of anticoagulant 10/30/2020  ? Chest pain due to myocardial ischemia 10/22/2020  ? Pulmonary embolism ([place]) 10/22/2020  ? Hypogonadism in male 10/22/2020  ? Mixed hyperlipidemia 05/21/2020  ? Hypertension 05/21/2020  ? Morbid obesity with alveolar hypoventilation ([place]) 05/21/2020  ? Polycythemia 07/29/2019  ? High cholesterol   ? Dizziness   ? ? ? ?Past Surgical History  ? ?Past Surgical History:  ?Procedure Laterality Date  ? bisep Left 2007  ? COLONOSCOPY WITH PROPOFOL N/A 03/01/2019  ? Procedure: COLONOSCOPY WITH PROPOFOL;  Surgeon: Jonathon Bellows, MD;  Location: [facility] ENDOSCOPY;  Service: Gastroenterology;  Laterality: N/A;  ? KNEE SURGERY Right   ? LASIK    ? PULMONARY THROMBECTOMY Bilateral 10/23/2020  ? Procedure: PULMONARY THROMBECTOMY;  Surgeon: Algernon Huxley, MD;  Location: [facility];  Service: Cardiovascular;  Laterality: Bilateral;  ? TONSILLECTOMY    ? UMBILICAL HERNIA REPAIR N/A 07/29/2015  ?  Procedure: HERNIA REPAIR UMBILICAL ADULT;  Surgeon: Christene Lye, MD;  Location: [facility] ORS;  Service: General;  Laterality: N/A;  ? UPPER GI ENDOSCOPY N/A 06/15/2021  ? Procedure: UPPER GI ENDOSCOPY;  Surgeon: Johnathan Hausen, MD;  Location: [facility] ORS;  Service: General;  Laterality: N/A;  ? VASOTOMY    ? ? ? ?Home Medications  ? ?Prior to Admission medications   ?Medication Sig Start Date End Date Taking? Authorizing Provider  ?albuterol (VENTOLIN HFA) 108 (90 Base) MCG/ACT inhaler Inhale 2 puffs into the lungs every 4 (four) hours as needed for wheezing or shortness of breath. 02/03/21   [provider]  ?amLODipine (NORVASC) 5 MG tablet Take 1  tablet (5 mg total) by mouth daily. 12/17/21   Theresia Lo, NP  ?cholecalciferol (VITAMIN D) 25 MCG (1000 UNIT) tablet Take 1,000 Units by mouth daily.    [provider]  ?EPINEPHrine 0.3 mg/0.3 mL IJ SOAJ injection Inject 0.3 mg into the muscle as needed for anaphylaxis. 02/03/21   [provider]  ?esomeprazole (NEXIUM) 40 MG capsule Take 40 mg by mouth at bedtime.    [provider]  ?hydrOXYzine (VISTARIL) 25 MG capsule Take 1 capsule (25 mg total) by mouth every 8 (eight) hours as needed. 12/17/21   Theresia Lo, NP  ?methylPREDNISolone (MEDROL DOSEPAK) 4 MG TBPK tablet As directed 12/16/21   Theresia Lo, NP  ?montelukast (SINGULAIR) 10 MG tablet Take 10 mg by mouth at bedtime.  06/12/14   [provider]  ?Multiple Vitamins-Minerals (MULTIVITAMIN WITH MINERALS) tablet Take 1 tablet by mouth daily.    [provider]  ?NON FORMULARY Pt uses a cpap nightly    [provider]  ?omega-3 acid ethyl esters (LOVAZA) 1 G capsule Take 1 g by mouth daily.    [provider]  ?ondansetron (ZOFRAN-ODT) 4 MG disintegrating tablet Dissolve 1 tablet (4 mg total) by mouth every 6 hours as needed for nausea or vomiting. 06/16/21   Johnathan Hausen, MD  ?simvastatin (ZOCOR) 10 MG tablet TAKE 1 TABLET BY MOUTH EVERYDAY AT BEDTIME 11/29/21   Cletis Athens, MD  ? ? ? ?Allergies  ?Lisinopril and Other ? ? ?Family History  ? ?Family History  ?Problem Relation Age of Onset  ? High Cholesterol Mother   ? Breast cancer Mother 30  ? Diabetes Father   ? High blood pressure Father   ? High Cholesterol Father   ? Prostate cancer Paternal Grandfather   ? Cancer Maternal Grandfather   ?     possible stomach cancer  ? Cancer Maternal Aunt   ?     possible pancreatic cancer  ? Esophageal cancer Maternal Uncle   ? Breast cancer Cousin   ? Ovarian cancer Cousin   ? ? ? ?Physical Exam  ?Triage Vital Signs: ?ED Triage Vitals  ?Enc Vitals Group  ?   BP 12/18/21 0542 126/75  ?    Pulse Rate 12/18/21 0542 71  ?   Resp 12/18/21 0542 18  ?   Temp 12/18/21 0542 98.7 ?F (37.1 ?C)  ?   Temp Source 12/18/21 0542 Oral  ?   SpO2 12/18/21 0542 94 %  ?   Weight 12/18/21 0543 240 lb 8.4 oz (109.1 kg)  ?   Height 12/18/21 0543 '5\' 11"'  (1.803 m)  ?   Head Circumference --   ?   Peak Flow --   ?   Pain Score 12/18/21 0542 4  ?   Pain Loc --   ?  Pain Edu? --   ?   Excl. in [place]? --   ? ? ?Updated Vital Signs: ?BP 126/75   Pulse 71   Temp 98.7 ?F (37.1 ?C) (Oral)   Resp 18   Ht '5\' 11"'  (1.803 m)   Wt 109.1 kg   SpO2 94%   BMI 33.55 kg/m?  ? ? ?General: Awake, mild distress.  ?CV:  RRR.  Good peripheral perfusion.  ?Resp:  Normal effort.  CTA B. ?Abd:  Nontender to light or deep palpation.  No distention.  ?Other:  Moderate upper lip swelling.  Posterior oropharynx clear without edema.  Phonation is normal.  There is no muffled voice or drooling.  Tolerating secretions well. ? ?ED Results / Procedures / Treatments  ?Labs ?(all labs ordered are listed, but only abnormal results are displayed) ?Labs Reviewed - No data to display ? ? ?EKG ? ?None ? ? ?RADIOLOGY ?None ? ? ?Official radiology report(s): ?No results found. ? ? ?PROCEDURES: ? ?Critical Care performed: Yes, see critical care procedure note(s) ? ?CRITICAL CARE ?Performed by: Paulette Blanch ? ? ?Total critical care time: 30 minutes ? ?Critical care time was exclusive of separately billable procedures and treating other patients. ? ?Critical care was necessary to treat or prevent imminent or life-threatening deterioration. ? ?Critical care was time spent personally by me on the following activities: development of treatment plan with patient and/or surrogate as well as nursing, discussions with consultants, evaluation of patient's response to treatment, examination of patient, obtaining history from patient or surrogate, ordering and performing treatments and interventions, ordering and review of laboratory studies, ordering and review of radiographic  studies, pulse oximetry and re-evaluation of patient's condition. ? ? ?.1-3 Lead EKG Interpretation ?Performed by: Paulette Blanch, MD ?Authorized by: Paulette Blanch, MD  ? ?  Interpretation: normal   ?  ECG rate:  7

## 2021-12-18 NOTE — ED Provider Notes (Signed)
Patient has not had any worsening of symptoms, he feels comfortable with discharge, he knows to return if any change.  He will DC ACE inhibitor and discussed with PCP for different blood pressure medication ?  ?Lavonia Drafts, MD ?12/18/21 [id number] ? ?

## 2021-12-19 ENCOUNTER — Emergency Department: Payer: 59

## 2021-12-19 ENCOUNTER — Encounter: Payer: Self-pay | Admitting: Emergency Medicine

## 2021-12-19 ENCOUNTER — Other Ambulatory Visit: Payer: Self-pay

## 2021-12-19 ENCOUNTER — Emergency Department
Admission: EM | Admit: 2021-12-19 | Discharge: 2021-12-19 | Disposition: A | Discharge status: Home / Self Care | Payer: 59 | Source: Home / Self Care | Attending: Emergency Medicine | Admitting: Emergency Medicine

## 2021-12-19 DIAGNOSIS — L03211 Cellulitis of face: Secondary | ICD-10-CM

## 2021-12-19 DIAGNOSIS — R220 Localized swelling, mass and lump, head: Secondary | ICD-10-CM | POA: Insufficient documentation

## 2021-12-19 LAB — CBC WITH DIFFERENTIAL/PLATELET
Abs Immature Granulocytes: 0.1 10*3/uL — ABNORMAL HIGH (ref 0.00–0.07)
Basophils Absolute: 0 10*3/uL (ref 0.0–0.1)
Basophils Relative: 0 %
Eosinophils Absolute: 0 10*3/uL (ref 0.0–0.5)
Eosinophils Relative: 0 %
HCT: 42.4 % (ref 39.0–52.0)
Hemoglobin: 13.6 g/dL (ref 13.0–17.0)
Immature Granulocytes: 1 %
Lymphocytes Relative: 10 %
Lymphs Abs: 1.7 10*3/uL (ref 0.7–4.0)
MCH: 26.4 pg (ref 26.0–34.0)
MCHC: 32.1 g/dL (ref 30.0–36.0)
MCV: 82.3 fL (ref 80.0–100.0)
Monocytes Absolute: 1.6 10*3/uL — ABNORMAL HIGH (ref 0.1–1.0)
Monocytes Relative: 9 %
Neutro Abs: 14.1 10*3/uL — ABNORMAL HIGH (ref 1.7–7.7)
Neutrophils Relative %: 80 %
Platelets: 277 10*3/uL (ref 150–400)
RBC: 5.15 MIL/uL (ref 4.22–5.81)
RDW: 13 % (ref 11.5–15.5)
WBC: 17.5 10*3/uL — ABNORMAL HIGH (ref 4.0–10.5)
nRBC: 0 % (ref 0.0–0.2)

## 2021-12-19 LAB — BASIC METABOLIC PANEL
Anion gap: 9 (ref 5–15)
BUN: 31 mg/dL — ABNORMAL HIGH (ref 6–20)
CO2: 26 mmol/L (ref 22–32)
Calcium: 9.3 mg/dL (ref 8.9–10.3)
Chloride: 103 mmol/L (ref 98–111)
Creatinine, Ser: 1.12 mg/dL (ref 0.61–1.24)
GFR, Estimated: >60 mL/min (ref >60)
Glucose, Bld: 115 mg/dL — ABNORMAL HIGH (ref 70–99)
Potassium: 4 mmol/L (ref 3.5–5.1)
Sodium: 138 mmol/L (ref 135–145)

## 2021-12-19 MED ORDER — SODIUM CHLORIDE 0.9 % IV SOLN
2.0000 g | Freq: Once | INTRAVENOUS | Status: AC
  Administered 2021-12-19: 2 g via INTRAVENOUS
  Filled 2021-12-19: qty 20

## 2021-12-19 MED ORDER — CEPHALEXIN 500 MG PO CAPS: 500.0000 mg | ORAL_CAPSULE | Freq: Three times a day (TID) | ORAL | 0 refills | Status: DC

## 2021-12-19 MED ORDER — SODIUM CHLORIDE 0.9 % IV BOLUS
1000.0000 mL | Freq: Once | INTRAVENOUS | Status: AC
  Administered 2021-12-19: 1000 mL via INTRAVENOUS

## 2021-12-19 MED ORDER — VANCOMYCIN HCL 2000 MG/400ML IV SOLN
2000.0000 mg | Freq: Once | INTRAVENOUS | Status: AC
  Administered 2021-12-19: 2000 mg via INTRAVENOUS
  Filled 2021-12-19: qty 400

## 2021-12-19 MED ORDER — ONDANSETRON HCL 4 MG/2ML IJ SOLN
4.0000 mg | Freq: Once | INTRAMUSCULAR | Status: AC
  Administered 2021-12-19: 4 mg via INTRAVENOUS
  Filled 2021-12-19: qty 2

## 2021-12-19 MED ORDER — IOHEXOL 300 MG/ML  SOLN
75.0000 mL | Freq: Once | INTRAMUSCULAR | Status: AC | PRN
  Administered 2021-12-19: 75 mL via INTRAVENOUS

## 2021-12-19 MED ORDER — MORPHINE SULFATE (PF) 4 MG/ML IV SOLN
4.0000 mg | Freq: Once | INTRAVENOUS | Status: AC
Start: 2021-12-19 — End: 2021-12-19
  Administered 2021-12-19: 4 mg via INTRAVENOUS
  Filled 2021-12-19: qty 1

## 2021-12-19 MED ORDER — MUPIROCIN 2 % EX OINT: 1.0000 "application " | TOPICAL_OINTMENT | Freq: Two times a day (BID) | CUTANEOUS | 0 refills | Status: AC

## 2021-12-19 MED ORDER — DEXAMETHASONE SODIUM PHOSPHATE 10 MG/ML IJ SOLN
10.0000 mg | Freq: Once | INTRAMUSCULAR | Status: AC
  Administered 2021-12-19: 10 mg via INTRAVENOUS
  Filled 2021-12-19: qty 1

## 2021-12-19 MED ORDER — SULFAMETHOXAZOLE-TRIMETHOPRIM 800-160 MG PO TABS: 1.0000 | ORAL_TABLET | Freq: Two times a day (BID) | ORAL | 0 refills | Status: DC

## 2021-12-19 MED ORDER — HYDROCODONE-ACETAMINOPHEN 5-325 MG PO TABS: 1.0000 | ORAL_TABLET | Freq: Four times a day (QID) | ORAL | 0 refills | Status: DC | PRN

## 2021-12-19 MED ORDER — KETOROLAC TROMETHAMINE 30 MG/ML IJ SOLN
15.0000 mg | Freq: Once | INTRAMUSCULAR | Status: AC
  Administered 2021-12-19: 15 mg via INTRAVENOUS
  Filled 2021-12-19: qty 1

## 2021-12-19 MED ORDER — LIDOCAINE-EPINEPHRINE 2 %-1:100000 IJ SOLN
20.0000 mL | Freq: Once | INTRAMUSCULAR | Status: AC
  Administered 2021-12-19: 20 mL via INTRADERMAL
  Filled 2021-12-19: qty 1

## 2021-12-19 NOTE — Assessment & Plan Note (Addendum)
Stopped lisinopril HTCZ due to edema on the lip. ?Started on amlodipine 5 mg and hydroxyzine 25 mg. ?Kenolog shot given on the right hip.      ?

## 2021-12-19 NOTE — Progress Notes (Signed)
PHARMACY -  BRIEF ANTIBIOTIC NOTE  ? ?Pharmacy has received consult(s) for vancomycin from an ED provider.  The patient's profile has been reviewed for ht/wt/allergies/indication/available labs.   ? ?One time order placed for vancomycin 2,000 mg loading dose ? ?Further antibiotics/pharmacy consults should be ordered by admitting physician if indicated.       ?                ?Thank you, ?Jaynie Bream, PharmD ?Pharmacy Resident  ?12/19/2021 ?8:46 AM ? ? ?

## 2021-12-19 NOTE — ED Notes (Signed)
Patient transported to CT 

## 2021-12-19 NOTE — ED Provider Notes (Signed)
? ?[facility] ?Provider Note ? ? ? Event Date/Time  ? First MD Initiated Contact with Patient 12/19/21 [phone]   ?  (approximate) ? ? ?History  ? ?Angioedema ? ? ?HPI ? ?Christopher Burns is a 51 y.o. male here with right facial swelling.  The patient is actually seen multiple times in the last week for this.  He was diagnosed with possible angioedema and given multiple course of steroids.  However, on further questioning, the patient does state that he had an area of redness along the right nostril as well as right eye at the onset of symptoms.  He states that this area has remained painful throughout the course of steroids.  He said transient improvement in the swelling, but no complete resolution of the pain.  Over the last 24 hours, the pain, swelling, and redness has worsened.  He now feels recurrent tightness in his sleep.  Denies any tongue swelling.  No difficulty swallowing.  No stridor.  No history of previous angioedema or allergic reactions.  No other specific triggers.  Denies any trauma to the area.  Of note, he does use a CPAP at night, and the area of pain was around where his CPAP sits.  No history of facial infections or MRSA. ?  ? ? ?Physical Exam  ? ?Triage Vital Signs: ?ED Triage Vitals  ?Enc Vitals Group  ?   BP 12/19/21 0656 (!) 147/81  ?   Pulse Rate 12/19/21 0656 62  ?   Resp 12/19/21 0656 18  ?   Temp 12/19/21 0656 98.2 ?F (36.8 ?C)  ?   Temp Source 12/19/21 0656 Oral  ?   SpO2 12/19/21 0656 98 %  ?   Weight 12/19/21 0650 240 lb 8.4 oz (109.1 kg)  ?   Height 12/19/21 0650 5\' 11"  (1.803 m)  ?   Head Circumference --   ?   Peak Flow --   ?   Pain Score 12/19/21 0650 7  ?   Pain Loc --   ?   Pain Edu? --   ?   Excl. in [place]? --   ? ? ?Most recent vital signs: ?Vitals:  ? 12/19/21 1130 12/19/21 1200  ?BP: 128/74 131/75  ?Pulse: 64 60  ?Resp: 16 16  ?Temp:    ?SpO2: 100% 99%  ? ? ? ?General: Awake, no distress.  ?CV:  Good peripheral perfusion.  ?Resp:  Normal effort.  ?Abd:  No  distention.  ?Other:  On ENT exam, there is what appears to be a focal area of folliculitis and firm induration just under the right nostril around a hair follicle.  There is significant surrounding induration and edema though not erythema across the right upper lip.  There are some mild tenderness extending to the cheek but no overt erythema or swelling around the eye.  No appreciable fluctuance or points of abscess.  The underlying teeth appear normal.  Extraocular movements are intact.  No proptosis or photophobia. ? ? ?ED Results / Procedures / Treatments  ? ?Labs ?(all labs ordered are listed, but only abnormal results are displayed) ?Labs Reviewed  ?CBC WITH DIFFERENTIAL/PLATELET - Abnormal; Notable for the following components:  ?    Result Value  ? WBC 17.5 (*)   ? Neutro Abs 14.1 (*)   ? Monocytes Absolute 1.6 (*)   ? Abs Immature Granulocytes 0.10 (*)   ? All other components within normal limits  ?BASIC METABOLIC PANEL - Abnormal; Notable for the  following components:  ? Glucose, Bld 115 (*)   ? BUN 31 (*)   ? All other components within normal limits  ? ? ? ?EKG ? ? ? ?RADIOLOGY ?CT face/sinuses: Facial cellulitis without evidence of abscess or deep infection ? ? ?I also independently reviewed and agree with radiologist interpretations. ? ? ?PROCEDURES: ? ?Critical Care performed: No ? ? ?MEDICATIONS ORDERED IN ED: ?Medications  ?cefTRIAXone (ROCEPHIN) 2 g in sodium chloride 0.9 % 100 mL IVPB (0 g Intravenous Stopped 12/19/21 0859)  ?morphine (PF) 4 MG/ML injection 4 mg (4 mg Intravenous Given 12/19/21 0757)  ?ketorolac (TORADOL) 30 MG/ML injection 15 mg (15 mg Intravenous Given 12/19/21 0757)  ?ondansetron [facility]) injection 4 mg (4 mg Intravenous Given 12/19/21 0757)  ?sodium chloride 0.9 % bolus 1,000 mL (0 mLs Intravenous Stopped 12/19/21 1221)  ?vancomycin (VANCOREADY) IVPB 2000 mg/400 mL (0 mg Intravenous Stopped 12/19/21 1221)  ?iohexol (OMNIPAQUE) 300 MG/ML solution 75 mL (75 mLs Intravenous Contrast Given  12/19/21 0848)  ?lidocaine-EPINEPHrine (XYLOCAINE W/EPI) 2 %-1:100000 (with pres) injection 20 mL (20 mLs Intradermal Given 12/19/21 1100)  ?dexamethasone (DECADRON) injection 10 mg (10 mg Intravenous Given 12/19/21 1200)  ? ? ? ?IMPRESSION / MDM / ASSESSMENT AND PLAN / ED COURSE  ?I reviewed the triage vital signs and the nursing notes. ?             ?               ? ? ?The patient is on the cardiac monitor to evaluate for evidence of arrhythmia and/or significant heart rate changes. ? ? ?Ddx:  ?Facial cellulitis, facial abscess, angioedema, dermatitis, dental infection ? ? ?MDM:  ?51 yo well appearing male here with recurrent facial swelling and pain. Pt has been seen twice and tx for angioedema, with transient improvement on steroids. On review of his history and phone images, sx seemed to have started with a red, indurated papule under his right nose. He states he has had occasional purulence from this area. On exam, pt has moderate induration and small amount of purulence around follicle on upper lip. I suspect this is the etiology for his ongoing swelling. CBC shows WBC 17k, likely from infection as well as steroids. He does not appear toxic or septic. BMP with elevated BUN/Cr ratio - suspect mild dehydration. IVF given. CT face/sinuses obtained, shows facial cellulitis but no abscess.  ? ?Suspect pt's recurrence is due to ongoing facial cellulitis, likely from ingrown hair of his beard. Given that he has not been on abx, will trial outpt abx course with keflex/bactrim for broad coverage. Vanc/Rocephin given in ED here. Strict return precautions given. No extension to orbit at this time. No sublingual swelling. ? ? ?MEDICATIONS GIVEN IN ED: ?Medications  ?cefTRIAXone (ROCEPHIN) 2 g in sodium chloride 0.9 % 100 mL IVPB (0 g Intravenous Stopped 12/19/21 0859)  ?morphine (PF) 4 MG/ML injection 4 mg (4 mg Intravenous Given 12/19/21 0757)  ?ketorolac (TORADOL) 30 MG/ML injection 15 mg (15 mg Intravenous Given 12/19/21 0757)   ?ondansetron [facility]) injection 4 mg (4 mg Intravenous Given 12/19/21 0757)  ?sodium chloride 0.9 % bolus 1,000 mL (0 mLs Intravenous Stopped 12/19/21 1221)  ?vancomycin (VANCOREADY) IVPB 2000 mg/400 mL (0 mg Intravenous Stopped 12/19/21 1221)  ?iohexol (OMNIPAQUE) 300 MG/ML solution 75 mL (75 mLs Intravenous Contrast Given 12/19/21 0848)  ?lidocaine-EPINEPHrine (XYLOCAINE W/EPI) 2 %-1:100000 (with pres) injection 20 mL (20 mLs Intradermal Given 12/19/21 1100)  ?dexamethasone (DECADRON) injection 10 mg (10 mg  Intravenous Given 12/19/21 1200)  ? ? ? ?Consults:  ?None ? ? ?EMR reviewed  ?Prior ED records ?PCP/UC visits for angioedema  ? ? ? ? ?FINAL CLINICAL IMPRESSION(S) / ED DIAGNOSES  ? ?Final diagnoses:  ?Facial cellulitis  ? ? ? ?Rx / DC Orders  ? ?ED Discharge Orders   ? ?      Ordered  ?  HYDROcodone-acetaminophen (NORCO/VICODIN) 5-325 MG tablet  Every 6 hours PRN       ? 12/19/21 1153  ?  mupirocin ointment (BACTROBAN) 2 %  2 times daily       ? 12/19/21 1153  ?  sulfamethoxazole-trimethoprim (BACTRIM DS) 800-160 MG tablet  2 times daily       ? 12/19/21 1153  ?  cephALEXin (KEFLEX) 500 MG capsule  3 times daily       ? 12/19/21 1153  ? ?  ?  ? ?  ? ? ? ?Note:  This document was prepared using Dragon voice recognition software and may include unintentional dictation errors. ?  ?Shaune PollackIsaacs, Donathan Buller, MD ?12/19/21 2025 ? ?

## 2021-12-19 NOTE — ED Triage Notes (Signed)
Pt arrived via POV with reports of returned lip swelling, pt states it started around midnight, reports feeling pain in his teeth. Pt was seen yesterday for the same and told to return. No tongue swelling and no difficulty breathing.  ?

## 2021-12-19 NOTE — Discharge Instructions (Addendum)
Apply a warm compress to the area of swelling/pain under your nose 3-4x daily for the next several days, to help with drainage ? ?Take the antibiotics as prescribed, starting tonight ? ?

## 2021-12-21 ENCOUNTER — Other Ambulatory Visit: Payer: Self-pay

## 2021-12-21 ENCOUNTER — Inpatient Hospital Stay
Admission: EM | Admit: 2021-12-21 | Discharge: 2021-12-23 | DRG: 603 | Disposition: A | Discharge status: Home / Self Care | Payer: 59 | Attending: Internal Medicine | Admitting: Internal Medicine

## 2021-12-21 DIAGNOSIS — J45909 Unspecified asthma, uncomplicated: Secondary | ICD-10-CM | POA: Diagnosis present

## 2021-12-21 DIAGNOSIS — L03211 Cellulitis of face: Secondary | ICD-10-CM | POA: Diagnosis not present

## 2021-12-21 DIAGNOSIS — Z800 Family history of malignant neoplasm of digestive organs: Secondary | ICD-10-CM

## 2021-12-21 DIAGNOSIS — K130 Diseases of lips: Secondary | ICD-10-CM | POA: Diagnosis present

## 2021-12-21 DIAGNOSIS — E669 Obesity, unspecified: Secondary | ICD-10-CM | POA: Diagnosis present

## 2021-12-21 DIAGNOSIS — Z79899 Other long term (current) drug therapy: Secondary | ICD-10-CM

## 2021-12-21 DIAGNOSIS — I10 Essential (primary) hypertension: Secondary | ICD-10-CM | POA: Diagnosis present

## 2021-12-21 DIAGNOSIS — Z6833 Body mass index (BMI) 33.0-33.9, adult: Secondary | ICD-10-CM

## 2021-12-21 DIAGNOSIS — Z86718 Personal history of other venous thrombosis and embolism: Secondary | ICD-10-CM

## 2021-12-21 DIAGNOSIS — R220 Localized swelling, mass and lump, head: Secondary | ICD-10-CM | POA: Diagnosis present

## 2021-12-21 DIAGNOSIS — Z833 Family history of diabetes mellitus: Secondary | ICD-10-CM

## 2021-12-21 DIAGNOSIS — Z8042 Family history of malignant neoplasm of prostate: Secondary | ICD-10-CM

## 2021-12-21 DIAGNOSIS — K219 Gastro-esophageal reflux disease without esophagitis: Secondary | ICD-10-CM | POA: Diagnosis present

## 2021-12-21 DIAGNOSIS — E7800 Pure hypercholesterolemia, unspecified: Secondary | ICD-10-CM | POA: Diagnosis present

## 2021-12-21 DIAGNOSIS — L739 Follicular disorder, unspecified: Secondary | ICD-10-CM | POA: Diagnosis present

## 2021-12-21 DIAGNOSIS — Z83438 Family history of other disorder of lipoprotein metabolism and other lipidemia: Secondary | ICD-10-CM

## 2021-12-21 DIAGNOSIS — T783XXA Angioneurotic edema, initial encounter: Secondary | ICD-10-CM | POA: Diagnosis present

## 2021-12-21 DIAGNOSIS — Z803 Family history of malignant neoplasm of breast: Secondary | ICD-10-CM

## 2021-12-21 DIAGNOSIS — Z8041 Family history of malignant neoplasm of ovary: Secondary | ICD-10-CM

## 2021-12-21 DIAGNOSIS — Z888 Allergy status to other drugs, medicaments and biological substances status: Secondary | ICD-10-CM

## 2021-12-21 DIAGNOSIS — G4733 Obstructive sleep apnea (adult) (pediatric): Secondary | ICD-10-CM | POA: Diagnosis present

## 2021-12-21 LAB — BASIC METABOLIC PANEL
Anion gap: 8 (ref 5–15)
BUN: 27 mg/dL — ABNORMAL HIGH (ref 6–20)
CO2: 26 mmol/L (ref 22–32)
Calcium: 9 mg/dL (ref 8.9–10.3)
Chloride: 103 mmol/L (ref 98–111)
Creatinine, Ser: 1.13 mg/dL (ref 0.61–1.24)
GFR, Estimated: >60 mL/min (ref >60)
Glucose, Bld: 96 mg/dL (ref 70–99)
Potassium: 4.1 mmol/L (ref 3.5–5.1)
Sodium: 137 mmol/L (ref 135–145)

## 2021-12-21 LAB — CBC
HCT: 44 % (ref 39.0–52.0)
Hemoglobin: 14.3 g/dL (ref 13.0–17.0)
MCH: 26.4 pg (ref 26.0–34.0)
MCHC: 32.5 g/dL (ref 30.0–36.0)
MCV: 81.2 fL (ref 80.0–100.0)
Platelets: 269 10*3/uL (ref 150–400)
RBC: 5.42 MIL/uL (ref 4.22–5.81)
RDW: 13.1 % (ref 11.5–15.5)
WBC: 12.1 10*3/uL — ABNORMAL HIGH (ref 4.0–10.5)
nRBC: 0 % (ref 0.0–0.2)

## 2021-12-21 LAB — MRSA NEXT GEN BY PCR, NASAL: MRSA by PCR Next Gen: DETECTED — AB

## 2021-12-21 LAB — GROUP A STREP BY PCR: Group A Strep by PCR: NOT DETECTED

## 2021-12-21 MED ORDER — KETOROLAC TROMETHAMINE 30 MG/ML IJ SOLN: 30.0000 mg | Freq: Four times a day (QID) | INTRAMUSCULAR | Status: DC | PRN

## 2021-12-21 MED ORDER — ONDANSETRON 4 MG PO TBDP
4.0000 mg | ORAL_TABLET | Freq: Four times a day (QID) | ORAL | Status: DC | PRN
  Filled 2021-12-21: qty 1

## 2021-12-21 MED ORDER — RISAQUAD PO CAPS
2.0000 | ORAL_CAPSULE | Freq: Three times a day (TID) | ORAL | Status: DC
  Administered 2021-12-21 – 2021-12-23 (×6): 2 via ORAL
  Filled 2021-12-21 (×7): qty 2

## 2021-12-21 MED ORDER — ALBUTEROL SULFATE (2.5 MG/3ML) 0.083% IN NEBU: 3.0000 mL | INHALATION_SOLUTION | RESPIRATORY_TRACT | Status: DC | PRN

## 2021-12-21 MED ORDER — LIDOCAINE-EPINEPHRINE-TETRACAINE (LET) TOPICAL GEL
3.0000 mL | Freq: Once | TOPICAL | Status: AC
  Administered 2021-12-21: 3 mL via TOPICAL
  Filled 2021-12-21: qty 3

## 2021-12-21 MED ORDER — VANCOMYCIN HCL IN DEXTROSE 1-5 GM/200ML-% IV SOLN
1000.0000 mg | Freq: Two times a day (BID) | INTRAVENOUS | Status: DC
  Administered 2021-12-22 – 2021-12-23 (×3): 1000 mg via INTRAVENOUS
  Filled 2021-12-21 (×3): qty 200

## 2021-12-21 MED ORDER — HYDROXYZINE HCL 25 MG PO TABS
25.0000 mg | ORAL_TABLET | Freq: Three times a day (TID) | ORAL | Status: DC | PRN
  Filled 2021-12-21: qty 1

## 2021-12-21 MED ORDER — AMLODIPINE BESYLATE 5 MG PO TABS
5.0000 mg | ORAL_TABLET | Freq: Every day | ORAL | Status: DC
  Administered 2021-12-22 – 2021-12-23 (×2): 5 mg via ORAL
  Filled 2021-12-21 (×2): qty 1

## 2021-12-21 MED ORDER — BUPIVACAINE HCL (PF) 0.5 % IJ SOLN
30.0000 mL | Freq: Once | INTRAMUSCULAR | Status: AC
  Administered 2021-12-21: 30 mL
  Filled 2021-12-21: qty 30

## 2021-12-21 MED ORDER — BACITRACIN-NEOMYCIN-POLYMYXIN OINTMENT TUBE
TOPICAL_OINTMENT | Freq: Three times a day (TID) | CUTANEOUS | Status: DC
  Administered 2021-12-21 – 2021-12-22 (×2): 1 via TOPICAL
  Filled 2021-12-21: qty 14.17

## 2021-12-21 MED ORDER — ACETAMINOPHEN 650 MG RE SUPP: 650.0000 mg | Freq: Four times a day (QID) | RECTAL | Status: DC | PRN

## 2021-12-21 MED ORDER — PANTOPRAZOLE SODIUM 40 MG PO TBEC
40.0000 mg | DELAYED_RELEASE_TABLET | Freq: Every day | ORAL | Status: DC
  Administered 2021-12-22 – 2021-12-23 (×2): 40 mg via ORAL
  Filled 2021-12-21 (×2): qty 1

## 2021-12-21 MED ORDER — VANCOMYCIN HCL 2000 MG/400ML IV SOLN
2000.0000 mg | Freq: Once | INTRAVENOUS | Status: AC
  Administered 2021-12-21: 2000 mg via INTRAVENOUS
  Filled 2021-12-21: qty 400

## 2021-12-21 MED ORDER — DEXAMETHASONE 1 MG/ML PO CONC
4.0000 mg | Freq: Two times a day (BID) | ORAL | Status: DC
Start: 2021-12-21 — End: 2021-12-23
  Administered 2021-12-21 – 2021-12-23 (×5): 4 mg via ORAL
  Filled 2021-12-21 (×6): qty 4

## 2021-12-21 MED ORDER — BACID PO TABS: 2.0000 | ORAL_TABLET | Freq: Three times a day (TID) | ORAL | Status: DC

## 2021-12-21 MED ORDER — MONTELUKAST SODIUM 10 MG PO TABS
10.0000 mg | ORAL_TABLET | Freq: Every day | ORAL | Status: DC
Start: 2021-12-21 — End: 2021-12-23
  Administered 2021-12-21 – 2021-12-22 (×2): 10 mg via ORAL
  Filled 2021-12-21 (×2): qty 1

## 2021-12-21 MED ORDER — HYDROCODONE-ACETAMINOPHEN 5-325 MG PO TABS: 1.0000 | ORAL_TABLET | Freq: Four times a day (QID) | ORAL | Status: DC | PRN

## 2021-12-21 MED ORDER — ACETAMINOPHEN 325 MG PO TABS: 650.0000 mg | ORAL_TABLET | Freq: Four times a day (QID) | ORAL | Status: DC | PRN

## 2021-12-21 MED ORDER — ATORVASTATIN CALCIUM 10 MG PO TABS
10.0000 mg | ORAL_TABLET | Freq: Every evening | ORAL | Status: DC
  Administered 2021-12-21 – 2021-12-22 (×2): 10 mg via ORAL
  Filled 2021-12-21 (×2): qty 1

## 2021-12-21 NOTE — Consult Note (Signed)
Pharmacy Antibiotic Note ? ?Christopher Burns is a 51 y.o. male with medical history including HTN, HLD, asthma, OSA on CPAP, obesity admitted on 12/21/2021 with  facial cellulitis . Failed outpatient therapy with Keflex and doxycycline. Pharmacy has been consulted for vancomycin dosing. ? ?Plan: ? ?Vancomycin 2 g IV LD followed by vancomycin 1 g IV q12h ?--Calculated AUC: 499, Cmin 13.9 ?--Daily Scr per protocol ?--Levels at steady state as clinically indicated ? ?Height: 5\' 11"  (180.3 cm) ?Weight: 109 kg (240 lb 4.8 oz) ?IBW/kg (Calculated) : 75.3 ? ?Temp (24hrs), Avg:98.2 ?F (36.8 ?C), Min:98 ?F (36.7 ?C), Max:98.4 ?F (36.9 ?C) ? ?Recent Labs  ?Lab 12/19/21 ?0744 12/21/21 ?1101  ?WBC 17.5* 12.1*  ?CREATININE 1.12 1.13  ?  ?Estimated Creatinine Clearance: 98.2 mL/min (by C-G formula based on SCr of 1.13 mg/dL).   ? ?Allergies  ?Allergen Reactions  ? Lisinopril Swelling  ?  Angioedema   ? Other Itching and Rash  ?  Pt was tested and positive for feline, canine and cockroaches. Pt has rash, itching, watery eyes. And is on allergy injections since 2017  ? ? ?Antimicrobials this admission: ?Vancomycin 5/9 >>  ? ?Dose adjustments this admission: ?N/A ? ?Microbiology results: ?5/9 Group A Strep PCR (throat): (-) ?5/9 MRSA PCR: (+) ? ?Thank you for allowing pharmacy to be a part of this patient?s care. ? ?Benita Gutter ?12/21/2021 8:20 PM ? ?

## 2021-12-21 NOTE — Plan of Care (Signed)

## 2021-12-21 NOTE — Consult Note (Signed)
PHARMACY -  BRIEF ANTIBIOTIC NOTE  ? ?Pharmacy has received consult(s) for Vancomycin from an ED provider.  The patient's profile has been reviewed for ht/wt/allergies/indication/available labs.   ? ?One time order(s) placed for : ?Vancomycin 2000mg  IVPB ? ?Further antibiotics/pharmacy consults should be ordered by admitting physician if indicated.       ?                ?Thank you, ?Shakedra Beam Rodriguez-Guzman PharmD, BCPS ?12/21/2021 1:15 PM ? ? ?

## 2021-12-21 NOTE — ED Provider Notes (Addendum)
? ?[facility] ?Provider Note ? ? ? Event Date/Time  ? First MD Initiated Contact with Patient 12/21/21 1048   ?  (approximate) ? ? ?History  ? ?Allergic Reaction ? ? ?HPI ? ?Christopher Burns is a 51 y.o. male with multiple recent visits to the ER for upper lip swelling initially felt to be angioedema subsequently found to have cellulitis and abscess of the upper lip status post reported I&D few days ago.  Feels overall is not worsening but feels like the swelling and edema is not improving.  He has been compliant with his medications.  He is no longer taking lisinopril.  He is taking Norvasc for blood pressure.  No fevers or chills.  No nausea or vomiting. ?  ? ? ?Physical Exam  ? ?Triage Vital Signs: ?ED Triage Vitals  ?Enc Vitals Group  ?   BP 12/21/21 0935 137/89  ?   Pulse Rate 12/21/21 0935 63  ?   Resp 12/21/21 0935 18  ?   Temp 12/21/21 0935 98 ?F (36.7 ?C)  ?   Temp src --   ?   SpO2 12/21/21 0935 98 %  ?   Weight 12/21/21 1052 240 lb 4.8 oz (109 kg)  ?   Height 12/21/21 1052 5\' 11"  (1.803 m)  ?   Head Circumference --   ?   Peak Flow --   ?   Pain Score 12/21/21 0943 3  ?   Pain Loc --   ?   Pain Edu? --   ?   Excl. in GC? --   ? ? ?Most recent vital signs: ?Vitals:  ? 12/21/21 0935  ?BP: 137/89  ?Pulse: 63  ?Resp: 18  ?Temp: 98 ?F (36.7 ?C)  ?SpO2: 98%  ? ? ? ?Constitutional: Alert  ?Eyes: Conjunctivae are normal.  ?Head: Atraumatic.  Swelling and edema to the right face and upper lid with area of fluctuance just below the right nare no proptosis mild overlying warmth tenderness no crepitus. ?Nose: No congestion/rhinnorhea. ?Mouth/Throat: Mucous membranes are moist.  No uvular edema ?Neck: Painless ROM.  ?Cardiovascular:   Good peripheral circulation. ?Respiratory: Normal respiratory effort.  No retractions.  ?Gastrointestinal: Soft and nontender.  ?Musculoskeletal:  no deformity ?Neurologic:  MAE spontaneously. No gross focal neurologic deficits are appreciated.  ?Skin:  Skin is warm,  dry and intact. No rash noted. ?Psychiatric: Mood and affect are normal. Speech and behavior are normal. ? ? ? ?ED Results / Procedures / Treatments  ? ?Labs ?(all labs ordered are listed, but only abnormal results are displayed) ?Labs Reviewed  ?CBC - Abnormal; Notable for the following components:  ?    Result Value  ? WBC 12.1 (*)   ? All other components within normal limits  ?BASIC METABOLIC PANEL - Abnormal; Notable for the following components:  ? BUN 27 (*)   ? All other components within normal limits  ? ? ? ?EKG ? ? ? ? ?RADIOLOGY ? ? ? ? ?PROCEDURES: ? ?Critical Care performed: No ? ?02/20/22.Incision and Drainage ? ?Date/Time: 12/21/2021 12:53 PM ?Performed by: 02/20/2022, MD ?Authorized by: Willy Eddy, MD  ? ?Consent:  ?  Consent obtained:  Verbal ?  Consent given by:  Patient ?  Risks discussed:  Bleeding, infection, incomplete drainage and pain ?  Alternatives discussed:  Alternative treatment, delayed treatment and observation ?Location:  ?  Type:  Abscess ?Anesthesia:  ?  Anesthesia method:  Local infiltration ?  Local anesthetic:  Lidocaine 1%  w/o epi ?Procedure type:  ?  Complexity:  Simple ?Procedure details:  ?  Needle aspiration: yes   ?  Needle size:  18 G ?  Incision types:  Stab incision ?  Incision depth:  Subcutaneous ?  Wound management:  Probed and deloculated ?  Drainage amount:  Scant ?  Wound treatment:  Wound left open ?  Packing materials:  None ?Post-procedure details:  ?  Procedure completion:  Tolerated ? ? ?MEDICATIONS ORDERED IN ED: ?Medications  ?lidocaine-EPINEPHrine-tetracaine (LET) topical gel (3 mLs Topical Given 12/21/21 1206)  ?bupivacaine(PF) (MARCAINE) 0.5 % injection 30 mL (30 mLs Infiltration Given by Other 12/21/21 1206)  ? ? ? ?IMPRESSION / MDM / ASSESSMENT AND PLAN / ED COURSE  ?I reviewed the triage vital signs and the nursing notes. ?             ?               ? ?Differential diagnosis includes, but is not limited to, cellulitis, abscess, foreign body,  impetigo, angioedema ? ?Patient presenting to the ER with worsening swelling upper lip.  Initially seen for angioedema subsequently found to have abscess cellulitis I&D.  Repeat blood work showing downtrending leukocytosis but his swelling is worsening worsening pain and discomfort.  This is despite 3 days of oral antibiotics.  I&D attempted the same location to see if he had reaccumulated previous abscess reported by Dr. Erma Heritage.  No significant purulence obtained on I&D.  Based on worsening swelling discomfort failing outpatient management will discuss with hospitalist consultation for admission for IV antibiotics ? ? ?Clinical Course as of 12/21/21 1253  ?Tue Dec 21, 2021  ?1252 Discussed with hospitalist who kindly agrees to admit patient for further management. [PR]  ?  ?Clinical Course User Index ?[PR] Willy Eddy, MD  ? ? ? ?FINAL CLINICAL IMPRESSION(S) / ED DIAGNOSES  ? ?Final diagnoses:  ?Facial swelling  ? ? ? ?Rx / DC Orders  ? ?ED Discharge Orders   ? ? None  ? ?  ? ? ? ?Note:  This document was prepared using Dragon voice recognition software and may include unintentional dictation errors. ? ?  ?Willy Eddy, MD ?12/21/21 1253 ? ?  ?Willy Eddy, MD ?12/21/21 1253 ? ?

## 2021-12-21 NOTE — H&P (Signed)
?History and Physical  ? ? ?Christopher Burns JQG:[id number] DOB: 10/16/1970 DOA: 12/21/2021 ? ?PCP: Cletis Athens, MD (Confirm with patient/family/NH records and if not entered, this has to be entered at [facility] point of entry) ?Patient coming from: Home ? ?I have personally briefly reviewed patient's old medical records in [place] ? ?Chief Complaint: Upper lip, and right-sided face swelling ? ?HPI: Christopher Burns is a 51 y.o. male with medical history significant of HTN, HLD, asthma, OSA on CPAP, obesity, presented with worsening of lip and right sided face swelling and pain. ? ?Patient developed " pimple" on the right upper lip last Tuesday, and he popped next day, when he saw some thick yellowish pus coming out.  Patient went to see PCP, and lisinopril was switched to amlodipine in fear of angioedema and patient was given 1 shot of IV Solu-Medrol in the office and started on Medrol pack which he completed yesterday.  Then last Friday, the whole lip and right side of face swollen up and patient came to ED, and patient was started on p.o. antibiotics of Keflex and doxycycline.  Over the last 2 days, the swelling and pain has come worse, denies any fever, no breathing problems, no tongue swelling no trouble swallowing, no muffled voice. ? ?ED Course: No hypotension no tachycardia afebrile.  CT maxillary and facial tissues showed cellulitis but no abscess of the right face and lip. ? ?Review of Systems: As per HPI otherwise 14 point review of systems negative.  ? ? ?Past Medical History:  ?Diagnosis Date  ? Asthma   ? Dizziness   ? Family history of BRCA1 gene positive   ? Family history of breast cancer   ? Family history of ovarian cancer   ? Family history of prostate cancer   ? GERD (gastroesophageal reflux disease)   ? Hemorrhoid   ? High blood pressure   ? High cholesterol   ? Hx of blood clots   ? Hypercholesteremia   ? Polycythemia   ? Pre-diabetes   ? Sleep apnea   ? cpap  ? ? ?Past Surgical History:   ?Procedure Laterality Date  ? bisep Left 2007  ? COLONOSCOPY WITH PROPOFOL N/A 03/01/2019  ? Procedure: COLONOSCOPY WITH PROPOFOL;  Surgeon: Jonathon Bellows, MD;  Location: [facility] ENDOSCOPY;  Service: Gastroenterology;  Laterality: N/A;  ? KNEE SURGERY Right   ? LASIK    ? PULMONARY THROMBECTOMY Bilateral 10/23/2020  ? Procedure: PULMONARY THROMBECTOMY;  Surgeon: Algernon Huxley, MD;  Location: [facility] CV LAB;  Service: Cardiovascular;  Laterality: Bilateral;  ? TONSILLECTOMY    ? UMBILICAL HERNIA REPAIR N/A 07/29/2015  ? Procedure: HERNIA REPAIR UMBILICAL ADULT;  Surgeon: Christene Lye, MD;  Location: [facility] ORS;  Service: General;  Laterality: N/A;  ? UPPER GI ENDOSCOPY N/A 06/15/2021  ? Procedure: UPPER GI ENDOSCOPY;  Surgeon: Johnathan Hausen, MD;  Location: [facility] ORS;  Service: General;  Laterality: N/A;  ? VASOTOMY    ? ? ? reports that he has never smoked. He has never used smokeless tobacco. He reports that he does not drink alcohol and does not use drugs. ? ?Allergies  ?Allergen Reactions  ? Lisinopril Swelling  ?  Angioedema   ? Other Itching and Rash  ?  Pt was tested and positive for feline, canine and cockroaches. Pt has rash, itching, watery eyes. And is on allergy injections since 2017  ? ? ?Family History  ?Problem Relation Age of Onset  ? High Cholesterol Mother   ?  Breast cancer Mother 34  ? Diabetes Father   ? High blood pressure Father   ? High Cholesterol Father   ? Prostate cancer Paternal Grandfather   ? Cancer Maternal Grandfather   ?     possible stomach cancer  ? Cancer Maternal Aunt   ?     possible pancreatic cancer  ? Esophageal cancer Maternal Uncle   ? Breast cancer Cousin   ? Ovarian cancer Cousin   ? ? ? ?Prior to Admission medications   ?Medication Sig Start Date End Date Taking? Authorizing Provider  ?albuterol (VENTOLIN HFA) 108 (90 Base) MCG/ACT inhaler Inhale 2 puffs into the lungs every 4 (four) hours as needed for wheezing or shortness of breath. 02/03/21   [provider]  ?amLODipine (NORVASC) 5 MG tablet Take 1 tablet (5 mg total) by mouth daily. 12/17/21   Theresia Lo, NP  ?cephALEXin (KEFLEX) 500 MG capsule Take 1 capsule (500 mg total) by mouth 3 (three) times daily for 7 days. 12/19/21 12/26/21  Duffy Bruce, MD  ?cholecalciferol (VITAMIN D) 25 MCG (1000 UNIT) tablet Take 1,000 Units by mouth daily.    [provider]  ?EPINEPHrine 0.3 mg/0.3 mL IJ SOAJ injection Inject 0.3 mg into the muscle as needed for anaphylaxis. 02/03/21   [provider]  ?esomeprazole (NEXIUM) 40 MG capsule Take 40 mg by mouth at bedtime.    [provider]  ?HYDROcodone-acetaminophen (NORCO/VICODIN) 5-325 MG tablet Take 1-2 tablets by mouth every 6 (six) hours as needed for moderate pain or severe pain. 12/19/21 12/19/22  Duffy Bruce, MD  ?hydrOXYzine (VISTARIL) 25 MG capsule Take 1 capsule (25 mg total) by mouth every 8 (eight) hours as needed. 12/17/21   Theresia Lo, NP  ?montelukast (SINGULAIR) 10 MG tablet Take 10 mg by mouth at bedtime.  06/12/14   [provider]  ?Multiple Vitamins-Minerals (MULTIVITAMIN WITH MINERALS) tablet Take 1 tablet by mouth daily.    [provider]  ?mupirocin ointment (BACTROBAN) 2 % Apply 1 application. topically 2 (two) times daily for 7 days. 12/19/21 12/26/21  Duffy Bruce, MD  ?NON FORMULARY Pt uses a cpap nightly    [provider]  ?omega-3 acid ethyl esters (LOVAZA) 1 G capsule Take 1 g by mouth daily.    [provider]  ?ondansetron (ZOFRAN-ODT) 4 MG disintegrating tablet Dissolve 1 tablet (4 mg total) by mouth every 6 hours as needed for nausea or vomiting. 06/16/21   Johnathan Hausen, MD  ?simvastatin (ZOCOR) 10 MG tablet TAKE 1 TABLET BY MOUTH EVERYDAY AT BEDTIME 11/29/21   Cletis Athens, MD  ?sulfamethoxazole-trimethoprim (BACTRIM DS) 800-160 MG tablet Take 1 tablet by mouth 2 (two) times daily for 7 days. 12/19/21 12/26/21  Duffy Bruce, MD  ? ? ?Physical Exam: ?Vitals:  ? 12/21/21  0935 12/21/21 1052 12/21/21 1310  ?BP: 137/89  130/80  ?Pulse: 63  68  ?Resp: 18  18  ?Temp: 98 ?F (36.7 ?C)    ?SpO2: 98%  98%  ?Weight:  109 kg   ?Height:  _0  (1.803 m)   ? ? ?Constitutional: NAD, calm, comfortable ?Vitals:  ? 12/21/21 0935 12/21/21 1052 12/21/21 1310  ?BP: 137/89  130/80  ?Pulse: 63  68  ?Resp: 18  18  ?Temp: 98 ?F (36.7 ?C)    ?SpO2: 98%  98%  ?Weight:  109 kg   ?Height:  _1  (1.803 m)   ? ?Eyes: PERRL, lids and conjunctivae normal ?ENMT: Mucous membranes are moist. Posterior pharynx  clear of any exudate or lesions.Normal dentition.  Right-sided lip swelling and tender, no undulation ?Neck: normal, supple, no masses, no thyromegaly ?Respiratory: clear to auscultation bilaterally, no wheezing, no crackles. Normal respiratory effort. No accessory muscle use.  ?Cardiovascular: Regular rate and rhythm, no murmurs / rubs / gallops. No extremity edema. 2+ pedal pulses. No carotid bruits.  ?Abdomen: no tenderness, no masses palpated. No hepatosplenomegaly. Bowel sounds positive.  ?Musculoskeletal: no clubbing / cyanosis. No joint deformity upper and lower extremities. Good ROM, no contractures. Normal muscle tone.  ?Skin: no rashes, lesions, ulcers. No induration ?Neurologic: CN 2-12 grossly intact. Sensation intact, DTR normal. Strength 5/5 in all 4.  ?Psychiatric: Normal judgment and insight. Alert and oriented x 3. Normal mood.  ? ? ? ?Labs on Admission: I have personally reviewed following labs and imaging studies ? ?CBC: ?Recent Labs  ?Lab 12/19/21 ?0744 12/21/21 ?1101  ?WBC 17.5* 12.1*  ?NEUTROABS 14.1*  --   ?HGB 13.6 14.3  ?HCT 42.4 44.0  ?MCV 82.3 81.2  ?PLT 277 269  ? ?Basic Metabolic Panel: ?Recent Labs  ?Lab 12/19/21 ?0744 12/21/21 ?1101  ?NA 138 137  ?K 4.0 4.1  ?CL 103 103  ?CO2 26 26  ?GLUCOSE 115* 96  ?BUN 31* 27*  ?CREATININE 1.12 1.13  ?CALCIUM 9.3 9.0  ? ?GFR: ?Estimated Creatinine Clearance: 98.2 mL/min (by C-G formula based on SCr of 1.13 mg/dL). ?Liver Function Tests: ?No  results for input(s): AST, ALT, ALKPHOS, BILITOT, PROT, ALBUMIN in the last 168 hours. ?No results for input(s): LIPASE, AMYLASE in the last 168 hours. ?No results for input(s): AMMONIA in the last 168 hours. ?

## 2021-12-21 NOTE — Plan of Care (Signed)
Dr notified of critical result: MRSA positive ?

## 2021-12-21 NOTE — ED Triage Notes (Signed)
Pt comes with c/o increased swelling of lip and allergic reaction. Pt states this all started about week ago and was taken off BP med. Pt started on new med for BP. Pt also had pimple above lip that was lanced and then prescribed antibiotics. Pt states he has been evaluated here and things have really just gotten worse. Pt states he was also placed on steroid.  ? ?Pt able to talk and no trouble swallowing. Pt does have noticeable swelling to right side and lip. ?

## 2021-12-22 DIAGNOSIS — Z79899 Other long term (current) drug therapy: Secondary | ICD-10-CM | POA: Diagnosis not present

## 2021-12-22 DIAGNOSIS — Z800 Family history of malignant neoplasm of digestive organs: Secondary | ICD-10-CM | POA: Diagnosis not present

## 2021-12-22 DIAGNOSIS — K130 Diseases of lips: Secondary | ICD-10-CM | POA: Diagnosis present

## 2021-12-22 DIAGNOSIS — L03211 Cellulitis of face: Secondary | ICD-10-CM | POA: Diagnosis present

## 2021-12-22 DIAGNOSIS — T783XXA Angioneurotic edema, initial encounter: Secondary | ICD-10-CM | POA: Diagnosis present

## 2021-12-22 DIAGNOSIS — R220 Localized swelling, mass and lump, head: Secondary | ICD-10-CM | POA: Diagnosis present

## 2021-12-22 DIAGNOSIS — J45909 Unspecified asthma, uncomplicated: Secondary | ICD-10-CM | POA: Diagnosis present

## 2021-12-22 DIAGNOSIS — Z8042 Family history of malignant neoplasm of prostate: Secondary | ICD-10-CM | POA: Diagnosis not present

## 2021-12-22 DIAGNOSIS — E7800 Pure hypercholesterolemia, unspecified: Secondary | ICD-10-CM

## 2021-12-22 DIAGNOSIS — K219 Gastro-esophageal reflux disease without esophagitis: Secondary | ICD-10-CM | POA: Diagnosis present

## 2021-12-22 DIAGNOSIS — G4733 Obstructive sleep apnea (adult) (pediatric): Secondary | ICD-10-CM | POA: Diagnosis present

## 2021-12-22 DIAGNOSIS — Z803 Family history of malignant neoplasm of breast: Secondary | ICD-10-CM | POA: Diagnosis not present

## 2021-12-22 DIAGNOSIS — E669 Obesity, unspecified: Secondary | ICD-10-CM | POA: Diagnosis present

## 2021-12-22 DIAGNOSIS — Z83438 Family history of other disorder of lipoprotein metabolism and other lipidemia: Secondary | ICD-10-CM | POA: Diagnosis not present

## 2021-12-22 DIAGNOSIS — Z8041 Family history of malignant neoplasm of ovary: Secondary | ICD-10-CM | POA: Diagnosis not present

## 2021-12-22 DIAGNOSIS — Z86718 Personal history of other venous thrombosis and embolism: Secondary | ICD-10-CM | POA: Diagnosis not present

## 2021-12-22 DIAGNOSIS — Z888 Allergy status to other drugs, medicaments and biological substances status: Secondary | ICD-10-CM | POA: Diagnosis not present

## 2021-12-22 DIAGNOSIS — I10 Essential (primary) hypertension: Secondary | ICD-10-CM | POA: Diagnosis present

## 2021-12-22 DIAGNOSIS — Z6833 Body mass index (BMI) 33.0-33.9, adult: Secondary | ICD-10-CM | POA: Diagnosis not present

## 2021-12-22 DIAGNOSIS — Z833 Family history of diabetes mellitus: Secondary | ICD-10-CM | POA: Diagnosis not present

## 2021-12-22 DIAGNOSIS — L739 Follicular disorder, unspecified: Secondary | ICD-10-CM | POA: Diagnosis present

## 2021-12-22 LAB — CBC
HCT: 44.5 % (ref 39.0–52.0)
Hemoglobin: 14.6 g/dL (ref 13.0–17.0)
MCH: 26.2 pg (ref 26.0–34.0)
MCHC: 32.8 g/dL (ref 30.0–36.0)
MCV: 79.9 fL — ABNORMAL LOW (ref 80.0–100.0)
Platelets: 319 10*3/uL (ref 150–400)
RBC: 5.57 MIL/uL (ref 4.22–5.81)
RDW: 12.9 % (ref 11.5–15.5)
WBC: 10.8 10*3/uL — ABNORMAL HIGH (ref 4.0–10.5)
nRBC: 0 % (ref 0.0–0.2)

## 2021-12-22 LAB — CREATININE, SERUM
Creatinine, Ser: 1.04 mg/dL (ref 0.61–1.24)
GFR, Estimated: >60 mL/min (ref >60)

## 2021-12-22 LAB — HIV ANTIBODY (ROUTINE TESTING W REFLEX): HIV Screen 4th Generation wRfx: NONREACTIVE

## 2021-12-22 NOTE — Assessment & Plan Note (Addendum)
The patient was given IV vancomycin in the hospital.  Failed outpatient therapy with Bactrim and Keflex.  Discharged home on doxycycline and prednisone taper.  Swelling upper lip much improved from admission.  Warm compresses. ?

## 2021-12-22 NOTE — Assessment & Plan Note (Signed)
-  Continue Lipitor °

## 2021-12-22 NOTE — Progress Notes (Signed)
?  Progress Note ? ? ?Patient: Christopher Burns WUG:[id number] DOB: 10/08/1970 DOA: 12/21/2021     0 ?DOS: the patient was seen and examined on 12/22/2021 ?  ? ? ?Assessment and Plan: ?* Facial cellulitis ?Continue IV vancomycin.  Failed outpatient therapy with Bactrim and Keflex.  Started off as a little redness under his left thigh then developed a pimple on his upper lip underneath his nose.  Then had some facial swelling.  Started on Decadron also.  Warm compresses. ? ?Swelling of upper lip ?Likely secondary to infection. ? ?Hypertension ?Continue Norvasc ? ?High cholesterol ?Continue Lipitor ? ? ? ? ?  ? ?Subjective: Patient does not complain of any pain.  Did have swelling of his right face and upper lip.  Initially started off with some redness underneath his left eye then developed a lesion underneath his nose.  Then had some lip and facial swelling.  He took a few days of Keflex and Bactrim as outpatient. ? ?Physical Exam: ?Vitals:  ? 12/21/21 2324 12/22/21 0223 12/22/21 0908 12/22/21 1248  ?BP: 122/69 118/78 134/77 135/82  ?Pulse: 65 (!) 57 65 78  ?Resp: 15 16 16 16   ?Temp: 98.5 ?F (36.9 ?C) 97.9 ?F (36.6 ?C) 98.2 ?F (36.8 ?C) 98.6 ?F (37 ?C)  ?TempSrc:      ?SpO2: 92% 98% 98% 97%  ?Weight:      ?Height:      ? ?Physical Exam ?HENT:  ?   Head: Normocephalic.  ?   Mouth/Throat:  ?   Pharynx: No oropharyngeal exudate.  ?   Comments: Upper lip swelling.  More on the right side. ?Eyes:  ?   General: Lids are normal.  ?   Conjunctiva/sclera: Conjunctivae normal.  ?Cardiovascular:  ?   Rate and Rhythm: Normal rate and regular rhythm.  ?   Heart sounds: Normal heart sounds, S1 normal and S2 normal.  ?Pulmonary:  ?   Breath sounds: Normal breath sounds. No decreased breath sounds, wheezing, rhonchi or rales.  ?Abdominal:  ?   Palpations: Abdomen is soft.  ?   Tenderness: There is no abdominal tenderness.  ?Musculoskeletal:  ?   Right lower leg: No swelling.  ?   Left lower leg: No swelling.  ?Skin: ?   General: Skin is  warm.  ?   Findings: No rash.  ?   Comments: Underneath his nose did develop a pimple-like area which could be an ingrown hair.  Some dried drainage from the area seen  ?Neurological:  ?   Mental Status: He is alert and oriented to person, place, and time.  ?  ?Data Reviewed: ?White blood cell count down from 17.5 the other day down to 10.8. ? ? ?Disposition: ?Status is: Inpatient ?Remains inpatient appropriate because: Failed outpatient treatment for right facial cellulitis. ?Planned Discharge Destination: Home ? ? ?Author: ? , MD ?12/22/2021 3:06 PM ? ?For on call review [email or web address].  ?

## 2021-12-22 NOTE — Assessment & Plan Note (Signed)
Likely secondary to infection. 

## 2021-12-22 NOTE — TOC Initial Note (Signed)
Transition of Care (TOC) - Initial/Assessment Note  ? ? ?Patient Details  ?Name: Christopher Burns ?MRN: [id number] ?Date of Birth: Sep 20, 1970 ? ?Transition of Care (TOC) CM/SW Contact:    ?Conception Oms, RN ?Phone Number: ?12/22/2021, 1:14 PM ? ?Clinical Narrative:                 ? ?Transition of Care (TOC) Screening Note ? ? ?Patient Details  ?Name: Christopher Burns ?Date of Birth: May 23, 1971 ? ? ?Transition of Care (TOC) CM/SW Contact:    ?Conception Oms, RN ?Phone Number: ?12/22/2021, 1:14 PM ? ? ? ?Transition of Care Department [facility]) has reviewed patient and no TOC needs have been identified at this time. We will continue to monitor patient advancement through interdisciplinary progression rounds. If new patient transition needs arise, please place a TOC consult. ?  ? ?  ?  ? ? ?Patient Goals and CMS Choice ?  ?  ?  ? ?Expected Discharge Plan and Services ?  ?  ?  ?  ?  ?                ?  ?  ?  ?  ?  ?  ?  ?  ?  ?  ? ?Prior Living Arrangements/Services ?  ?  ?  ?       ?  ?  ?  ?  ? ?Activities of Daily Living ?Home Assistive Devices/Equipment: None ?ADL Screening (condition at time of admission) ?Patient's cognitive ability adequate to safely complete daily activities?: Yes ?Is the patient deaf or have difficulty hearing?: No ?Does the patient have difficulty seeing, even when wearing glasses/contacts?: No ?Does the patient have difficulty concentrating, remembering, or making decisions?: No ?Patient able to express need for assistance with ADLs?: Yes ?Does the patient have difficulty dressing or bathing?: No ?Independently performs ADLs?: Yes (appropriate for developmental age) ?Does the patient have difficulty walking or climbing stairs?: No ?Weakness of Legs: None ?Weakness of Arms/Hands: None ? ?Permission Sought/Granted ?  ?  ?   ?   ?   ?   ? ?Emotional Assessment ?  ?  ?  ?  ?  ?  ? ?Admission diagnosis:  Facial swelling [R22.0] ?Facial cellulitis [U72.536] ?Patient Active Problem List  ? Diagnosis  Date Noted  ? Facial cellulitis 12/21/2021  ? Swelling of upper lip 12/19/2021  ? Allergic reaction 12/16/2021  ? Class 1 obesity with serious comorbidity and body mass index (BMI) of 33.0 to 33.9 in adult 12/16/2021  ? S/P laparoscopic sleeve gastrectomy 06/15/2021  ? LBBB (left bundle branch block)   ? OSA on CPAP 05/31/2021  ? CPAP use counseling 05/31/2021  ? Morbid obesity ([place]) 05/31/2021  ? BRCA1 gene mutation positive 01/12/2021  ? Genetic testing 01/12/2021  ? Family history of BRCA1 gene positive   ? Family history of breast cancer   ? Family history of ovarian cancer   ? Family history of prostate cancer   ? Prediabetes 11/19/2020  ? Flatulence, eructation and gas pain 11/03/2020  ? Acid reflux 11/03/2020  ? Hiccoughs 11/03/2020  ? Proctalgia 11/03/2020  ? Thrombosed external hemorrhoids 11/03/2020  ? Hospital discharge follow-up 10/30/2020  ? Long term current use of anticoagulant 10/30/2020  ? Chest pain due to myocardial ischemia 10/22/2020  ? Pulmonary embolism ([place]) 10/22/2020  ? Hypogonadism in male 10/22/2020  ? Mixed hyperlipidemia 05/21/2020  ? Hypertension 05/21/2020  ? Morbid obesity with alveolar hypoventilation ([place]) 05/21/2020  ? Polycythemia 07/29/2019  ?  High cholesterol   ? Dizziness   ? ?PCP:  Cletis Athens, MD ?Pharmacy:   ?[id number] [id number]?Phone: [phone]: [phone]? ?[email or web address]?Phone: [phone]: [phone]? ? ? ? ?Social Determinants of Health (SDOH) Interventions ?  ? ?Readmission Risk Interventions ?   ? View : No data to display.  ?  ?  ?  ? ? ? ?

## 2021-12-22 NOTE — Assessment & Plan Note (Signed)
Continue Norvasc

## 2021-12-22 NOTE — Plan of Care (Signed)

## 2021-12-22 NOTE — Progress Notes (Signed)
Brief Nutrition Note ? ?Received call from nutritional services staff. Pt is requesting olive oil for his sandwich. Pt is currently on heart healthy diet. Reviewed medical history; RD will liberalize diet to 2 gram sodium for wider variety of meal selections.  ? ?Levada Schilling, RD, LDN, CDCES ?Registered Dietitian II ?Certified Diabetes Care and Education Specialist ?Please refer to [facility] for RD and/or RD on-call/weekend/after hours pager  ?

## 2021-12-23 LAB — CREATININE, SERUM
Creatinine, Ser: 0.94 mg/dL (ref 0.61–1.24)
GFR, Estimated: >60 mL/min (ref >60)

## 2021-12-23 MED ORDER — DOXYCYCLINE HYCLATE 100 MG PO TABS
100.0000 mg | ORAL_TABLET | Freq: Two times a day (BID) | ORAL | Status: DC
  Administered 2021-12-23: 100 mg via ORAL
  Filled 2021-12-23: qty 1

## 2021-12-23 MED ORDER — DOXYCYCLINE HYCLATE 100 MG PO TABS: 100.0000 mg | ORAL_TABLET | Freq: Two times a day (BID) | ORAL | 0 refills | Status: AC

## 2021-12-23 MED ORDER — RISAQUAD PO CAPS: 1.0000 | ORAL_CAPSULE | Freq: Three times a day (TID) | ORAL | 0 refills | Status: AC

## 2021-12-23 MED ORDER — PREDNISONE 10 MG PO TABS: ORAL_TABLET | ORAL | 0 refills | Status: DC

## 2021-12-23 NOTE — Plan of Care (Signed)
?  Problem: Education: ?Goal: Knowledge of General Education information will improve ?Description: Including pain rating scale, medication(s)/side effects and non-pharmacologic comfort measures ?12/23/2021 0844 by Orma Render, RN ?Outcome: Adequate for Discharge ?12/23/2021 0805 by Orma Render, RN ?Outcome: Progressing ?  ?Problem: Health Behavior/Discharge Planning: ?Goal: Ability to manage health-related needs will improve ?12/23/2021 0844 by Orma Render, RN ?Outcome: Adequate for Discharge ?12/23/2021 0805 by Orma Render, RN ?Outcome: Progressing ?  ?Problem: Clinical Measurements: ?Goal: Ability to maintain clinical measurements within normal limits will improve ?12/23/2021 0844 by Orma Render, RN ?Outcome: Adequate for Discharge ?12/23/2021 0805 by Orma Render, RN ?Outcome: Progressing ?Goal: Will remain free from infection ?12/23/2021 0844 by Orma Render, RN ?Outcome: Adequate for Discharge ?12/23/2021 0805 by Orma Render, RN ?Outcome: Progressing ?Goal: Diagnostic test results will improve ?12/23/2021 0844 by Orma Render, RN ?Outcome: Adequate for Discharge ?12/23/2021 0805 by Orma Render, RN ?Outcome: Progressing ?Goal: Respiratory complications will improve ?12/23/2021 0844 by Orma Render, RN ?Outcome: Adequate for Discharge ?12/23/2021 0805 by Orma Render, RN ?Outcome: Progressing ?Goal: Cardiovascular complication will be avoided ?12/23/2021 0844 by Orma Render, RN ?Outcome: Adequate for Discharge ?12/23/2021 0805 by Orma Render, RN ?Outcome: Progressing ?  ?Problem: Activity: ?Goal: Risk for activity intolerance will decrease ?12/23/2021 0844 by Orma Render, RN ?Outcome: Adequate for Discharge ?12/23/2021 0805 by Orma Render, RN ?Outcome: Progressing ?  ?Problem: Nutrition: ?Goal: Adequate nutrition will be maintained ?12/23/2021 0844 by Orma Render, RN ?Outcome: Adequate for Discharge ?12/23/2021 0805 by Orma Render, RN ?Outcome: Progressing ?  ?Problem: Coping: ?Goal: Level of anxiety  will decrease ?12/23/2021 0844 by Orma Render, RN ?Outcome: Adequate for Discharge ?12/23/2021 0805 by Orma Render, RN ?Outcome: Progressing ?  ?Problem: Elimination: ?Goal: Will not experience complications related to bowel motility ?12/23/2021 0844 by Orma Render, RN ?Outcome: Adequate for Discharge ?12/23/2021 0805 by Orma Render, RN ?Outcome: Progressing ?Goal: Will not experience complications related to urinary retention ?12/23/2021 0844 by Orma Render, RN ?Outcome: Adequate for Discharge ?12/23/2021 0805 by Orma Render, RN ?Outcome: Progressing ?  ?Problem: Pain Managment: ?Goal: General experience of comfort will improve ?12/23/2021 0844 by Orma Render, RN ?Outcome: Adequate for Discharge ?12/23/2021 0805 by Orma Render, RN ?Outcome: Progressing ?  ?Problem: Safety: ?Goal: Ability to remain free from injury will improve ?12/23/2021 0844 by Orma Render, RN ?Outcome: Adequate for Discharge ?12/23/2021 0805 by Orma Render, RN ?Outcome: Progressing ?  ?Problem: Skin Integrity: ?Goal: Risk for impaired skin integrity will decrease ?12/23/2021 0844 by Orma Render, RN ?Outcome: Adequate for Discharge ?12/23/2021 0805 by Orma Render, RN ?Outcome: Progressing ?  ?

## 2021-12-23 NOTE — Plan of Care (Signed)

## 2021-12-23 NOTE — Plan of Care (Signed)
?  Problem: Education: ?Goal: Knowledge of General Education information will improve ?Description: Including pain rating scale, medication(s)/side effects and non-pharmacologic comfort measures ?Outcome: Progressing ?  ?Problem: Health Behavior/Discharge Planning: ?Goal: Ability to manage health-related needs will improve ?Outcome: Progressing ?  ?Problem: Clinical Measurements: ?Goal: Ability to maintain clinical measurements within normal limits will improve ?Outcome: Progressing ?  ?Problem: Clinical Measurements: ?Goal: Will remain free from infection ?Outcome: Progressing ?  ?Problem: Clinical Measurements: ?Goal: Diagnostic test results will improve ?Outcome: Progressing ?  ?Problem: Nutrition: ?Goal: Adequate nutrition will be maintained ?Outcome: Progressing ?  ?Problem: Coping: ?Goal: Level of anxiety will decrease ?Outcome: Progressing ?  ?Problem: Elimination: ?Goal: Will not experience complications related to bowel motility ?Outcome: Progressing ?  ?Problem: Pain Managment: ?Goal: General experience of comfort will improve ?Outcome: Progressing ?  ?Problem: Safety: ?Goal: Ability to remain free from injury will improve ?Outcome: Progressing ?  ?Problem: Safety: ?Goal: Ability to remain free from injury will improve ?Outcome: Progressing ?  ?Problem: Skin Integrity: ?Goal: Risk for impaired skin integrity will decrease ?Outcome: Progressing ?  ?

## 2021-12-23 NOTE — Discharge Summary (Signed)
?Physician Discharge Summary ?  ?Patient: Christopher Burns MRN: [id number] DOB: 08-22-1970  ?Admit date:     12/21/2021  ?Discharge date: 12/23/21  ?Discharge Physician: Alford Highlandichard Shey Yott  ? ?PCP: Corky DownsMasoud, Javed, MD  ? ?Recommendations at discharge:  ? ?Follow-up PCP 5 days ? ?Discharge Diagnoses: ?Principal Problem: ?  Facial cellulitis ?Active Problems: ?  Swelling of upper lip ?  High cholesterol ?  Hypertension ? ? ? ?Hospital Course: ?The patient was admitted to the hospital on 12/21/2021 and discharged on 12/23/2021.  Patient came in with right face and lip swelling.  The patient stated it started as some redness underneath his left eye then he developed a pimple on his right upper lip underneath his nose.  He was given Keflex and Bactrim as outpatient.  His face and his lip started swelling.  Patient was admitted to the hospital and given IV vancomycin.  Area of the pimple started draining.  CT scan did not show any drainable abscess.  Patient was discharged home in stable condition. ? ?Assessment and Plan: ?* Facial cellulitis ?Continue IV vancomycin.  Failed outpatient therapy with Bactrim and Keflex.  Started off as a little redness under his left thigh then developed a pimple on his upper lip underneath his nose.  Then had some facial swelling.  Started on Decadron also.  Warm compresses. ? ?Swelling of upper lip ?Likely secondary to infection. ? ?Hypertension ?Continue Norvasc ? ?High cholesterol ?Continue Lipitor ? ? ? ? ?  ? ? ?Consultants: None ?Procedures performed: None ?Disposition: Home ?Diet recommendation:  ?Cardiac diet ?DISCHARGE MEDICATION: ?Allergies as of 12/23/2021   ? ?   Reactions  ? Lisinopril Swelling  ? Angioedema   ? Other Itching, Rash  ? Pt was tested and positive for feline, canine and cockroaches. Pt has rash, itching, watery eyes. And is on allergy injections since 2017  ? ?  ? ?  ?Medication List  ?  ? ?STOP taking these medications   ? ?cephALEXin 500 MG capsule ?Commonly known as:  KEFLEX ?  ?sulfamethoxazole-trimethoprim 800-160 MG tablet ?Commonly known as: BACTRIM DS ?  ? ?  ? ?TAKE these medications   ? ?acidophilus Caps capsule ?Take 1 capsule by mouth 3 (three) times daily. Okay to substitute any probiotic ?  ?albuterol 108 (90 Base) MCG/ACT inhaler ?Commonly known as: VENTOLIN HFA ?Inhale 2 puffs into the lungs every 4 (four) hours as needed for wheezing or shortness of breath. ?  ?amLODipine 5 MG tablet ?Commonly known as: NORVASC ?Take 1 tablet (5 mg total) by mouth daily. ?  ?cholecalciferol 25 MCG (1000 UNIT) tablet ?Commonly known as: VITAMIN D ?Take 1,000 Units by mouth daily. ?  ?doxycycline 100 MG tablet ?Commonly known as: VIBRA-TABS ?Take 1 tablet (100 mg total) by mouth every 12 (twelve) hours for 8 days. ?  ?EPINEPHrine 0.3 mg/0.3 mL Soaj injection ?Commonly known as: EPI-PEN ?Inject 0.3 mg into the muscle as needed for anaphylaxis. ?  ?esomeprazole 40 MG capsule ?Commonly known as: NEXIUM ?Take 40 mg by mouth at bedtime. ?  ?HYDROcodone-acetaminophen 5-325 MG tablet ?Commonly known as: NORCO/VICODIN ?Take 1-2 tablets by mouth every 6 (six) hours as needed for moderate pain or severe pain. ?  ?hydrOXYzine 25 MG capsule ?Commonly known as: VISTARIL ?Take 1 capsule (25 mg total) by mouth every 8 (eight) hours as needed. ?  ?montelukast 10 MG tablet ?Commonly known as: SINGULAIR ?Take 10 mg by mouth at bedtime. ?  ?multivitamin with minerals tablet ?Take 1 tablet by mouth  daily. ?  ?mupirocin ointment 2 % ?Commonly known as: BACTROBAN ?Apply 1 application. topically 2 (two) times daily for 7 days. ?  ?NON FORMULARY ?Pt uses a cpap nightly ?  ?omega-3 acid ethyl esters 1 g capsule ?Commonly known as: LOVAZA ?Take 1 g by mouth daily. ?  ?ondansetron 4 MG disintegrating tablet ?Commonly known as: ZOFRAN-ODT ?Dissolve 1 tablet (4 mg total) by mouth every 6 hours as needed for nausea or vomiting. ?  ?predniSONE 10 MG tablet ?Commonly known as: DELTASONE ?Two tabs po day1; one tab po  day2,3; 1/2 tab po day4,5 ?Start taking on: Dec 24, 2021 ?  ?simvastatin 10 MG tablet ?Commonly known as: ZOCOR ?TAKE 1 TABLET BY MOUTH EVERYDAY AT BEDTIME ?  ? ?  ? ? Follow-up Information   ? ? Your medical doctor Follow up in 5 day(s).   ? ?  ?  ? ?  ?  ? ?  ? ?Discharge Exam: ?Filed Weights  ? 12/21/21 1052  ?Weight: 109 kg  ? ?Physical Exam ?HENT:  ?   Head: Normocephalic.  ?   Mouth/Throat:  ?   Pharynx: No oropharyngeal exudate.  ?   Comments: Upper lip swelling much improved from yesterday. ?Eyes:  ?   General: Lids are normal.  ?   Conjunctiva/sclera: Conjunctivae normal.  ?Cardiovascular:  ?   Rate and Rhythm: Normal rate and regular rhythm.  ?   Heart sounds: Normal heart sounds, S1 normal and S2 normal.  ?Pulmonary:  ?   Breath sounds: Normal breath sounds. No decreased breath sounds, wheezing, rhonchi or rales.  ?Abdominal:  ?   Palpations: Abdomen is soft.  ?   Tenderness: There is no abdominal tenderness.  ?Musculoskeletal:  ?   Right lower leg: No swelling.  ?   Left lower leg: No swelling.  ?Skin: ?   General: Skin is warm.  ?   Findings: No rash.  ?   Comments: Patient had quite a bit of drainage from his upper lip area.  ?Neurological:  ?   Mental Status: He is alert and oriented to person, place, and time.  ?  ? ?Condition at discharge: stable ? ?The results of significant diagnostics from this hospitalization (including imaging, microbiology, ancillary and laboratory) are listed below for reference.  ? ?Imaging Studies: ?CT Maxillofacial W Contrast ? ?Result Date: 12/19/2021 ?CLINICAL DATA:  51 year old male with facial swelling. EXAM: CT MAXILLOFACIAL WITH CONTRAST TECHNIQUE: Multidetector CT imaging of the maxillofacial structures was performed with intravenous contrast. Multiplanar CT image reconstructions were also generated. RADIATION DOSE REDUCTION: This exam was performed according to the departmental dose-optimization program which includes automated exposure control, adjustment of the mA  and/or kV according to patient size and/or use of iterative reconstruction technique. CONTRAST:  92mL OMNIPAQUE IOHEXOL 300 MG/ML  SOLN COMPARISON:  None Available. FINDINGS: Osseous: Mandible intact and normally located. Dental caries, but no obvious acute dental finding. Maxilla, zygoma, pterygoid, and nasal bones appear intact. Central skull base and visible cervical vertebrae appear intact. Some cervical spine degeneration. Orbits: Intact orbital walls. Globes and intraorbital soft tissues appears symmetric and within normal limits. Other facial soft tissue inflammation does not appear to affect the orbits. Sinuses: Opacified posterior left ethmoid air cells, but other paranasal sinuses are well aerated. Mild maxillary alveolar recess mucosal thickening. Hypoplastic frontal sinuses. Tympanic cavities and mastoids are clear. Soft tissues: Negative visible larynx, pharynx, parapharyngeal spaces, retropharyngeal space, sublingual space, submandibular spaces, parotid spaces. Visible major vascular structures in the neck  and at the skull base are patent. Incidental dilated but patent and enhancing asymmetric left facial vein (series 2, image 48). Right anterior face and right buccal space region soft tissue swelling and stranding (series 2, image 37) primarily spares the masticator space. No associated soft tissue gas. The inflammation extends to the right nasal labial fold. 37 Right premalar space is mildly affected. No organized or drainable fluid collection identified. No associated cervical lymphadenopathy. Limited intracranial: Calcified atherosclerosis at the skull base. Negative visible brain parenchyma. IMPRESSION: 1. Right face Cellulitis with no abscess or obvious dental source. No orbit involvement. 2. Minor paranasal sinus inflammation appears unrelated. Electronically Signed   By: Odessa Fleming M.D.   On: 12/19/2021 09:19   ? ?Microbiology: ?Results for orders placed or performed during the hospital encounter  of 12/21/21  ?MRSA Next Gen by PCR, Nasal     Status: Abnormal  ? Collection Time: 12/21/21  3:00 PM  ? Specimen: Nasal Mucosa; Nasal Swab  ?Result Value Ref Range Status  ? MRSA by PCR Next Gen DETECTED

## 2021-12-24 ENCOUNTER — Ambulatory Visit: Payer: 59 | Admitting: Nurse Practitioner

## 2021-12-25 ENCOUNTER — Emergency Department
Admission: EM | Admit: 2021-12-25 | Discharge: 2021-12-25 | Discharge status: Against Medical Advice | Payer: 59 | Attending: Emergency Medicine | Admitting: Emergency Medicine

## 2021-12-25 ENCOUNTER — Emergency Department: Payer: 59

## 2021-12-25 ENCOUNTER — Encounter: Payer: Self-pay | Admitting: Intensive Care

## 2021-12-25 ENCOUNTER — Other Ambulatory Visit: Payer: Self-pay

## 2021-12-25 DIAGNOSIS — R066 Hiccough: Secondary | ICD-10-CM | POA: Diagnosis present

## 2021-12-25 DIAGNOSIS — Z5321 Procedure and treatment not carried out due to patient leaving prior to being seen by health care provider: Secondary | ICD-10-CM | POA: Insufficient documentation

## 2021-12-25 LAB — BASIC METABOLIC PANEL
Anion gap: 9 (ref 5–15)
BUN: 38 mg/dL — ABNORMAL HIGH (ref 6–20)
CO2: 26 mmol/L (ref 22–32)
Calcium: 9.1 mg/dL (ref 8.9–10.3)
Chloride: 102 mmol/L (ref 98–111)
Creatinine, Ser: 1.04 mg/dL (ref 0.61–1.24)
GFR, Estimated: >60 mL/min (ref >60)
Glucose, Bld: 88 mg/dL (ref 70–99)
Potassium: 3.7 mmol/L (ref 3.5–5.1)
Sodium: 137 mmol/L (ref 135–145)

## 2021-12-25 LAB — CBC
HCT: 43.6 % (ref 39.0–52.0)
Hemoglobin: 14.3 g/dL (ref 13.0–17.0)
MCH: 26.5 pg (ref 26.0–34.0)
MCHC: 32.8 g/dL (ref 30.0–36.0)
MCV: 80.9 fL (ref 80.0–100.0)
Platelets: 335 10*3/uL (ref 150–400)
RBC: 5.39 MIL/uL (ref 4.22–5.81)
RDW: 13.2 % (ref 11.5–15.5)
WBC: 12.1 10*3/uL — ABNORMAL HIGH (ref 4.0–10.5)
nRBC: 0 % (ref 0.0–0.2)

## 2021-12-25 LAB — TROPONIN I (HIGH SENSITIVITY): Troponin I (High Sensitivity): 5 ng/L (ref <18)

## 2021-12-25 NOTE — ED Triage Notes (Signed)
Patient presents with uncontrolled hiccups since Thursday. Patient was admitted at [facility] for the angioedema of face and reports testing positive for MRSA.  ?

## 2021-12-25 NOTE — ED Notes (Signed)
Pt reports is leaving and will follow up with his MD. Pt states ate some peanut butter while waiting and hiccups got better. RN apologetic for wait ?

## 2021-12-30 ENCOUNTER — Ambulatory Visit: Payer: 59 | Admitting: Nurse Practitioner

## 2022-02-12 ENCOUNTER — Other Ambulatory Visit: Payer: Self-pay | Admitting: Nurse Practitioner

## 2022-03-21 ENCOUNTER — Other Ambulatory Visit: Payer: Self-pay

## 2022-03-21 ENCOUNTER — Emergency Department: Payer: 59

## 2022-03-21 ENCOUNTER — Emergency Department
Admission: EM | Admit: 2022-03-21 | Discharge: 2022-03-21 | Disposition: A | Discharge status: Home / Self Care | Payer: 59 | Attending: Emergency Medicine | Admitting: Emergency Medicine

## 2022-03-21 DIAGNOSIS — J45909 Unspecified asthma, uncomplicated: Secondary | ICD-10-CM | POA: Insufficient documentation

## 2022-03-21 DIAGNOSIS — I10 Essential (primary) hypertension: Secondary | ICD-10-CM | POA: Insufficient documentation

## 2022-03-21 DIAGNOSIS — K8020 Calculus of gallbladder without cholecystitis without obstruction: Secondary | ICD-10-CM | POA: Diagnosis not present

## 2022-03-21 DIAGNOSIS — E876 Hypokalemia: Secondary | ICD-10-CM | POA: Insufficient documentation

## 2022-03-21 DIAGNOSIS — Z79899 Other long term (current) drug therapy: Secondary | ICD-10-CM | POA: Insufficient documentation

## 2022-03-21 DIAGNOSIS — R1010 Upper abdominal pain, unspecified: Secondary | ICD-10-CM | POA: Diagnosis present

## 2022-03-21 LAB — CBC
HCT: 44.9 % (ref 39.0–52.0)
Hemoglobin: 14.6 g/dL (ref 13.0–17.0)
MCH: 26.5 pg (ref 26.0–34.0)
MCHC: 32.5 g/dL (ref 30.0–36.0)
MCV: 81.5 fL (ref 80.0–100.0)
Platelets: 247 10*3/uL (ref 150–400)
RBC: 5.51 MIL/uL (ref 4.22–5.81)
RDW: 12.7 % (ref 11.5–15.5)
WBC: 7.6 10*3/uL (ref 4.0–10.5)
nRBC: 0 % (ref 0.0–0.2)

## 2022-03-21 LAB — COMPREHENSIVE METABOLIC PANEL
ALT: 25 U/L (ref 0–44)
AST: 27 U/L (ref 15–41)
Albumin: 4.5 g/dL (ref 3.5–5.0)
Alkaline Phosphatase: 35 U/L — ABNORMAL LOW (ref 38–126)
Anion gap: 9 (ref 5–15)
BUN: 19 mg/dL (ref 6–20)
CO2: 23 mmol/L (ref 22–32)
Calcium: 9.3 mg/dL (ref 8.9–10.3)
Chloride: 108 mmol/L (ref 98–111)
Creatinine, Ser: 1.14 mg/dL (ref 0.61–1.24)
GFR, Estimated: >60 mL/min (ref >60)
Glucose, Bld: 180 mg/dL — ABNORMAL HIGH (ref 70–99)
Potassium: 3.1 mmol/L — ABNORMAL LOW (ref 3.5–5.1)
Sodium: 140 mmol/L (ref 135–145)
Total Bilirubin: 0.5 mg/dL (ref 0.3–1.2)
Total Protein: 7.4 g/dL (ref 6.5–8.1)

## 2022-03-21 LAB — TROPONIN I (HIGH SENSITIVITY)
Troponin I (High Sensitivity): 4 ng/L (ref <18)
Troponin I (High Sensitivity): 3 ng/L (ref <18)

## 2022-03-21 LAB — URINALYSIS, ROUTINE W REFLEX MICROSCOPIC
Bilirubin Urine: NEGATIVE
Glucose, UA: NEGATIVE mg/dL
Hgb urine dipstick: NEGATIVE
Ketones, ur: NEGATIVE mg/dL
Leukocytes,Ua: NEGATIVE
Nitrite: NEGATIVE
Protein, ur: NEGATIVE mg/dL
Specific Gravity, Urine: 1.04 — ABNORMAL HIGH (ref 1.005–1.030)
pH: 8 (ref 5.0–8.0)

## 2022-03-21 LAB — LIPASE, BLOOD: Lipase: 33 U/L (ref 11–51)

## 2022-03-21 MED ORDER — ONDANSETRON 4 MG PO TBDP: 4.0000 mg | ORAL_TABLET | Freq: Four times a day (QID) | ORAL | 0 refills | Status: DC | PRN

## 2022-03-21 MED ORDER — MORPHINE SULFATE (PF) 4 MG/ML IV SOLN
4.0000 mg | Freq: Once | INTRAVENOUS | Status: AC
  Administered 2022-03-21: 4 mg via INTRAVENOUS
  Filled 2022-03-21: qty 1

## 2022-03-21 MED ORDER — IOHEXOL 350 MG/ML SOLN
100.0000 mL | Freq: Once | INTRAVENOUS | Status: AC | PRN
  Administered 2022-03-21: 100 mL via INTRAVENOUS

## 2022-03-21 MED ORDER — OXYCODONE-ACETAMINOPHEN 5-325 MG PO TABS: 2.0000 | ORAL_TABLET | Freq: Four times a day (QID) | ORAL | 0 refills | Status: DC | PRN

## 2022-03-21 MED ORDER — SODIUM CHLORIDE 0.9 % IV BOLUS (SEPSIS)
1000.0000 mL | Freq: Once | INTRAVENOUS | Status: AC
  Administered 2022-03-21: 1000 mL via INTRAVENOUS

## 2022-03-21 MED ORDER — ONDANSETRON 4 MG PO TBDP
4.0000 mg | ORAL_TABLET | Freq: Once | ORAL | Status: AC | PRN
Start: 2022-03-21 — End: 2022-03-21
  Administered 2022-03-21: 4 mg via ORAL
  Filled 2022-03-21: qty 1

## 2022-03-21 MED ORDER — ONDANSETRON HCL 4 MG/2ML IJ SOLN
4.0000 mg | Freq: Once | INTRAMUSCULAR | Status: AC
Start: 2022-03-21 — End: 2022-03-21
  Administered 2022-03-21: 4 mg via INTRAVENOUS
  Filled 2022-03-21: qty 2

## 2022-03-21 MED ORDER — POTASSIUM CHLORIDE CRYS ER 20 MEQ PO TBCR
40.0000 meq | EXTENDED_RELEASE_TABLET | Freq: Once | ORAL | Status: AC
  Administered 2022-03-21: 40 meq via ORAL
  Filled 2022-03-21: qty 2

## 2022-03-21 NOTE — ED Notes (Signed)
Pt ambulatory to restroom, unable to have a BM, urine collected. Pt tolerated well. U/S at bedside.

## 2022-03-21 NOTE — ED Provider Notes (Signed)
[facility] Provider Note    Event Date/Time   First MD Initiated Contact with Patient 03/21/22 [phone]     (approximate)   History   Abdominal Pain   HPI  Christopher Burns is a 51 y.o. male with history of hypertension, hyperlipidemia, prediabetes, asthma, obesity status post gastric sleeve by Dr. Hassell Done in November 2022 who presents to the emergency department with complaints of upper abdominal pain that started tonight at dinner.  Has had nausea.  No vomiting or diarrhea.  No chest pain or shortness of breath.  No known history of gallstones.   History provided by patient.    Past Medical History:  Diagnosis Date   Asthma    Dizziness    Family history of BRCA1 gene positive    Family history of breast cancer    Family history of ovarian cancer    Family history of prostate cancer    GERD (gastroesophageal reflux disease)    Hemorrhoid    High blood pressure    High cholesterol    Hx of blood clots    Hypercholesteremia    Polycythemia    Pre-diabetes    Sleep apnea    cpap    Past Surgical History:  Procedure Laterality Date   bisep Left 2007   COLONOSCOPY WITH PROPOFOL N/A 03/01/2019   Procedure: COLONOSCOPY WITH PROPOFOL;  Surgeon: Jonathon Bellows, MD;  Location: [facility] ENDOSCOPY;  Service: Gastroenterology;  Laterality: N/A;   KNEE SURGERY Right    LASIK     PULMONARY THROMBECTOMY Bilateral 10/23/2020   Procedure: PULMONARY THROMBECTOMY;  Surgeon: Algernon Huxley, MD;  Location: [facility];  Service: Cardiovascular;  Laterality: Bilateral;   TONSILLECTOMY     UMBILICAL HERNIA REPAIR N/A 07/29/2015   Procedure: HERNIA REPAIR UMBILICAL ADULT;  Surgeon: Christene Lye, MD;  Location: [facility] ORS;  Service: General;  Laterality: N/A;   UPPER GI ENDOSCOPY N/A 06/15/2021   Procedure: UPPER GI ENDOSCOPY;  Surgeon: Johnathan Hausen, MD;  Location: [facility] ORS;  Service: General;  Laterality: N/A;   VASOTOMY      MEDICATIONS:  Prior to  Admission medications   Medication Sig Start Date End Date Taking? Authorizing Provider  acidophilus (RISAQUAD) CAPS capsule Take 1 capsule by mouth 3 (three) times daily. Okay to substitute any probiotic 12/23/21   Loletha Grayer, MD  albuterol (VENTOLIN HFA) 108 (90 Base) MCG/ACT inhaler Inhale 2 puffs into the lungs every 4 (four) hours as needed for wheezing or shortness of breath. 02/03/21   [provider]  amLODipine (NORVASC) 5 MG tablet TAKE 1 TABLET (5 MG TOTAL) BY MOUTH DAILY. 02/16/22   Cletis Athens, MD  cholecalciferol (VITAMIN D) 25 MCG (1000 UNIT) tablet Take 1,000 Units by mouth daily.    [provider]  EPINEPHrine 0.3 mg/0.3 mL IJ SOAJ injection Inject 0.3 mg into the muscle as needed for anaphylaxis. 02/03/21   [provider]  esomeprazole (NEXIUM) 40 MG capsule Take 40 mg by mouth at bedtime.    [provider]  HYDROcodone-acetaminophen (NORCO/VICODIN) 5-325 MG tablet Take 1-2 tablets by mouth every 6 (six) hours as needed for moderate pain or severe pain. 12/19/21 12/19/22  Duffy Bruce, MD  hydrOXYzine (VISTARIL) 25 MG capsule Take 1 capsule (25 mg total) by mouth every 8 (eight) hours as needed. 12/17/21   Theresia Lo, NP  montelukast (SINGULAIR) 10 MG tablet Take 10 mg by mouth at bedtime.  06/12/14   [provider]  Multiple  Vitamins-Minerals (MULTIVITAMIN WITH MINERALS) tablet Take 1 tablet by mouth daily.    [provider]  NON FORMULARY Pt uses a cpap nightly    [provider]  omega-3 acid ethyl esters (LOVAZA) 1 G capsule Take 1 g by mouth daily.    [provider]  ondansetron (ZOFRAN-ODT) 4 MG disintegrating tablet Dissolve 1 tablet (4 mg total) by mouth every 6 hours as needed for nausea or vomiting. 06/16/21   Johnathan Hausen, MD  predniSONE (DELTASONE) 10 MG tablet Two tabs po day1; one tab po day2,3; 1/2 tab po day4,5 12/24/21   Loletha Grayer, MD  simvastatin (ZOCOR) 10 MG tablet TAKE 1  TABLET BY MOUTH EVERYDAY AT BEDTIME 11/29/21   Cletis Athens, MD    Physical Exam   Triage Vital Signs: ED Triage Vitals [03/21/22 0034]  Enc Vitals Group     BP (!) 149/76     Pulse Rate (!) 57     Resp (!) 22     Temp 97.7 F (36.5 C)     Temp Source Oral     SpO2 100 %     Weight 240 lb (108.9 kg)     Height 6' (1.829 m)     Head Circumference      Peak Flow      Pain Score 10     Pain Loc      Pain Edu?      Excl. in [place]?     Most recent vital signs: Vitals:   03/21/22 0400 03/21/22 0430  BP: 125/72 121/67  Pulse: 69 73  Resp: 12 (!) 25  Temp:  98.4 F (36.9 C)  SpO2: 97% 96%    CONSTITUTIONAL: Alert and oriented and responds appropriately to questions.  Appears uncomfortable but afebrile and nontoxic HEAD: Normocephalic, atraumatic EYES: Conjunctivae clear, pupils appear equal, sclera nonicteric ENT: normal nose; moist mucous membranes NECK: Supple, normal ROM CARD: RRR; S1 and S2 appreciated; no murmurs, no clicks, no rubs, no gallops RESP: Normal chest excursion without splinting or tachypnea; breath sounds clear and equal bilaterally; no wheezes, no rhonchi, no rales, no hypoxia or respiratory distress, speaking full sentences ABD/GI: Normal bowel sounds; non-distended; soft, tender throughout the upper abdomen without guarding or rebound, no tenderness at McBurney's point BACK: The back appears normal EXT: Normal ROM in all joints; no deformity noted, no edema; no cyanosis SKIN: Normal color for age and race; warm; no rash on exposed skin NEURO: Moves all extremities equally, normal speech PSYCH: The patient's mood and manner are appropriate.   ED Results / Procedures / Treatments   LABS: (all labs ordered are listed, but only abnormal results are displayed) Labs Reviewed  COMPREHENSIVE METABOLIC PANEL - Abnormal; Notable for the following components:      Result Value   Potassium 3.1 (*)    Glucose, Bld 180 (*)    Alkaline Phosphatase 35 (*)    All  other components within normal limits  URINALYSIS, ROUTINE W REFLEX MICROSCOPIC - Abnormal; Notable for the following components:   Color, Urine STRAW (*)    APPearance CLEAR (*)    Specific Gravity, Urine 1.040 (*)    All other components within normal limits  LIPASE, BLOOD  CBC  TROPONIN I (HIGH SENSITIVITY)  TROPONIN I (HIGH SENSITIVITY)     EKG:  EKG Interpretation  Date/Time:  Monday March 21 2022 00:26:58 EDT Ventricular Rate:  56 PR Interval:  142 QRS Duration: 88 QT Interval:  446 QTC Calculation: 430  R Axis:   -37 Text Interpretation: Sinus bradycardia with Fusion complexes and Premature atrial complexes with Abberant conduction Left axis deviation T wave abnormality, consider anterior ischemia that have been present on previous EKG Abnormal ECG When compared with ECG of 25-Dec-2021 12:18, Fusion complexes are now Present Abberant conduction is now Present Confirmed by Pryor Curia [phone]) on 03/21/2022 1:55:01 AM         RADIOLOGY: My personal review and interpretation of imaging: CT of abdomen pelvis and ultrasound shows cholelithiasis.  I have personally reviewed all radiology reports.   US ABDOMEN LIMITED RUQ (LIVER/GB)  Result Date: 03/21/2022 CLINICAL DATA:  Right upper quadrant abdominal pain EXAM: ULTRASOUND ABDOMEN LIMITED RIGHT UPPER QUADRANT COMPARISON:  None Available. FINDINGS: Gallbladder: Multiple layering gallstones are seen within the gallbladder. The gallbladder, however, is not distended, there is no gallbladder wall thickening, and no pericholecystic fluid is identified. The sonographic Percell Miller sign is reportedly negative. Common bile duct: Diameter: 4 mm in proximal diameter Liver: No focal lesion identified. Within normal limits in parenchymal echogenicity. Portal vein is patent on color Doppler imaging with normal direction of blood flow towards the liver. Other: None. IMPRESSION: Cholelithiasis without sonographic evidence of acute cholecystitis.  Electronically Signed   By: Fidela Salisbury M.D.   On: 03/21/2022 04:31   CT ABDOMEN PELVIS W CONTRAST  Result Date: 03/21/2022 CLINICAL DATA:  Acute abdominal pain. Gastric sleeve in November. Nausea. EXAM: CT ABDOMEN AND PELVIS WITH CONTRAST TECHNIQUE: Multidetector CT imaging of the abdomen and pelvis was performed using the standard protocol following bolus administration of intravenous contrast. RADIATION DOSE REDUCTION: This exam was performed according to the departmental dose-optimization program which includes automated exposure control, adjustment of the mA and/or kV according to patient size and/or use of iterative reconstruction technique. CONTRAST:  164m OMNIPAQUE IOHEXOL 350 MG/ML SOLN COMPARISON:  None Available. FINDINGS: Lower chest: No basilar airspace disease or pleural effusion. Hepatobiliary: Mild subjective hepatic steatosis. Focal fatty sparing adjacent to the gallbladder fossa. No focal lesion. There is mild edema adjacent to the gallbladder with questionable gallbladder wall thickening. No calcified gallstone. No biliary dilatation. Pancreas: No ductal dilatation or inflammation. Spleen: Normal in size without focal abnormality. Adrenals/Urinary Tract: Normal adrenal glands. No hydronephrosis. No renal calculi. No focal renal abnormality. Unremarkable urinary bladder. Stomach/Bowel: Tiny hiatal hernia. Gastric sleeve resection. No gastric wall thickening. Mild fecalization of distal small bowel contents, no bowel obstruction or inflammation. Normal appendix. Moderate diffuse colonic stool burden. Occasional colonic diverticulosis without focal diverticulitis. Vascular/Lymphatic: Minimal aortic atherosclerosis. No aneurysm. Patent portal and splenic veins. Scattered small mesenteric nodes, not enlarged by size criteria. No enlarged abdominopelvic lymph nodes. Reproductive: Enlarged prostate gland measures 5 cm transverse. Other: No free air, free fluid, or intra-abdominal fluid collection.  Moderate-sized fat containing left inguinal hernia. Musculoskeletal: There are no acute or suspicious osseous abnormalities. Degenerative disc disease at the lumbosacral junction. IMPRESSION: 1. Mild edema adjacent to the gallbladder with questionable gallbladder wall thickening. Recommend right upper quadrant ultrasound for further evaluation. 2. Moderate colonic stool burden with fecalization of distal small bowel contents, suggesting constipation and slow transit. No bowel obstruction or inflammation. 3. Enlarged prostate gland. 4. Moderate-sized fat containing left inguinal hernia. 5. Mild hepatic steatosis. Aortic Atherosclerosis (ICD10-I70.0). Electronically Signed   By: MKeith RakeM.D.   On: 03/21/2022 03:03     PROCEDURES:  Critical Care performed: No      .1-3 Lead EKG Interpretation  Performed by: Nina Mondor, KDelice Bison DO Authorized by: Darianne Muralles, KCyril Mourning  N, DO     Interpretation: normal     ECG rate:  73   ECG rate assessment: normal     Rhythm: sinus rhythm     Ectopy: none     Conduction: normal       IMPRESSION / MDM / ASSESSMENT AND PLAN / ED COURSE  I reviewed the triage vital signs and the nursing notes.    Patient here with upper abdominal pain.  Has had recent gastric sleeve.  The patient is on the cardiac monitor to evaluate for evidence of arrhythmia and/or significant heart rate changes.   DIFFERENTIAL DIAGNOSIS (includes but not limited to):   Cholelithiasis, cholecystitis, pancreatitis, gastritis, GERD, bowel obstruction, less likely ACS, PE, dissection, perforation   Patient's presentation is most consistent with acute presentation with potential threat to life or bodily function.   PLAN: We will obtain CBC, CMP, lipase, troponin, CT of abdomen pelvis.  Will give IV fluids, pain and nausea medicine.   MEDICATIONS GIVEN IN ED: Medications  morphine (PF) 4 MG/ML injection 4 mg (has no administration in time range)  ondansetron (ZOFRAN-ODT)  disintegrating tablet 4 mg (4 mg Oral Given 03/21/22 0049)  morphine (PF) 4 MG/ML injection 4 mg (4 mg Intravenous Given 03/21/22 0256)  ondansetron (ZOFRAN) injection 4 mg (4 mg Intravenous Given 03/21/22 0257)  sodium chloride 0.9 % bolus 1,000 mL (0 mLs Intravenous Stopped 03/21/22 0341)  iohexol (OMNIPAQUE) 350 MG/ML injection 100 mL (100 mLs Intravenous Contrast Given 03/21/22 0223)     ED COURSE: Patient's labs show no leukocytosis.  Potassium slightly low at 3.1.  Will give oral replacement.  Normal renal function, LFTs and lipase.  Troponin x2 negative.  Urine does not appear infected.  CT scan reviewed and interpreted by myself and the radiologist shows questionable gallbladder wall thickening and edema.  Radiology recommends obtaining ultrasound.   Ultrasound reviewed and interpreted by myself radiologist and shows cholelithiasis without cholecystitis.  No wall thickening or pericholecystic fluid.  Negative Murphy sign.  Patient reports pain is improved.  Tolerating p.o.  He is comfortable with plan for discharge home with low-fat diet, pain and nausea medicine and close follow-up with surgery as an outpatient.   At this time, I do not feel there is any life-threatening condition present. I reviewed all nursing notes, vitals, pertinent previous records.  All lab and urine results, EKGs, imaging ordered have been independently reviewed and interpreted by myself.  I reviewed all available radiology reports from any imaging ordered this visit.  Based on my assessment, I feel the patient is safe to be discharged home without further emergent workup and can continue workup as an outpatient as needed. Discussed all findings, treatment plan as well as usual and customary return precautions.  They verbalize understanding and are comfortable with this plan.  Outpatient follow-up has been provided as needed.  All questions have been answered.    CONSULTS: Admission considered but pain is well controlled and  patient tolerating p.o.  Patient comfortable with plan for follow-up with Dr. Hassell Done with surgery for further outpatient management of symptomatic cholelithiasis.   OUTSIDE RECORDS REVIEWED: Reviewed patient's operative note on 06/15/2021 by Dr. Hassell Done.       FINAL CLINICAL IMPRESSION(S) / ED DIAGNOSES   Final diagnoses:  Gallstones     Rx / DC Orders   ED Discharge Orders          Ordered    oxyCODONE-acetaminophen (PERCOCET) 5-325 MG tablet  Every 6 hours PRN  03/21/22 0448    ondansetron (ZOFRAN-ODT) 4 MG disintegrating tablet  Every 6 hours PRN        03/21/22 0448             Note:  This document was prepared using Dragon voice recognition software and may include unintentional dictation errors.   Cassidy Tabet, Delice Bison, DO 03/21/22 [phone]

## 2022-03-21 NOTE — Discharge Instructions (Addendum)

## 2022-03-21 NOTE — ED Notes (Signed)
Pt drank ginger ale for PO challenge. Denies nausea/vomiting at this time.

## 2022-03-21 NOTE — ED Notes (Signed)
Pt alert and oriented in NAD breathing e/u on room air. With no needs, ride is on the way MD agreeable to plan.

## 2022-03-21 NOTE — ED Triage Notes (Signed)
Patient reports severe epigastric pain that began approximately 45 minutes ago. Reports he had gastric sleeve in November and states he believes he overate. Denies shortness of breath. Reports nausea. Grimacing, appears very uncomfortable and holding abdomen during triage.

## 2022-03-22 ENCOUNTER — Other Ambulatory Visit: Payer: Self-pay | Admitting: *Deleted

## 2022-03-23 ENCOUNTER — Encounter (INDEPENDENT_AMBULATORY_CARE_PROVIDER_SITE_OTHER): Payer: Self-pay

## 2022-03-28 ENCOUNTER — Ambulatory Visit: Payer: 59 | Admitting: Surgery

## 2022-03-30 ENCOUNTER — Other Ambulatory Visit: Payer: Self-pay | Admitting: Internal Medicine

## 2022-03-30 DIAGNOSIS — J309 Allergic rhinitis, unspecified: Secondary | ICD-10-CM | POA: Insufficient documentation

## 2022-03-30 DIAGNOSIS — J4520 Mild intermittent asthma, uncomplicated: Secondary | ICD-10-CM | POA: Insufficient documentation

## 2022-04-03 ENCOUNTER — Other Ambulatory Visit: Payer: Self-pay | Admitting: Internal Medicine

## 2022-04-12 NOTE — Progress Notes (Signed)
Sent message, via epic in basket, requesting order in epic from surgeon     04/12/22 1526  Preop Orders  Has preop orders? No  Name of staff/physician contacted for orders(Indicate phone or IB message) M. Daphine Deutscher, MD.

## 2022-04-15 NOTE — Progress Notes (Addendum)
COVID Vaccine Completed:  Yes  Date of COVID positive in last 90 days:  PCP - Diona Fanti, MD Cardiologist - Nathaniel Man, MD Pulmonologist - Freda Munro, MD  Chest x-ray - 12-25-21 Epic EKG - 03-22-22 Epic Stress Test -  ECHO - 06-15-21 Epic Cardiac Cath -  Pacemaker/ICD device last checked: Spinal Cord Stimulator:  Bowel Prep -   Sleep Study -  CPAP -   Fasting Blood Sugar -  Checks Blood Sugar _____ times a day  Blood Thinner Instructions: Aspirin Instructions: Last Dose:  Activity level:  Can go up a flight of stairs and perform activities of daily living without stopping and without symptoms of chest pain or shortness of breath.  Able to exercise without symptoms  Unable to go up a flight of stairs without symptoms of     Anesthesia review:  LBBB, HTN, hx of pulmonary emobolish  Patient denies shortness of breath, fever, cough and chest pain at PAT appointment  Patient verbalized understanding of instructions that were given to them at the PAT appointment. Patient was also instructed that they will need to review over the PAT instructions again at home before surgery.

## 2022-04-15 NOTE — Patient Instructions (Addendum)
SURGICAL WAITING ROOM VISITATION Patients having surgery or a procedure may have no more than 2 support people in the waiting area - these visitors may rotate.   Children under the age of 28 must have an adult with them who is not the patient. If the patient needs to stay at the hospital during part of their recovery, the visitor guidelines for inpatient rooms apply. Pre-op nurse will coordinate an appropriate time for 1 support person to accompany patient in pre-op.  This support person may not rotate.    Please refer to the [facility] website for the visitor guidelines for Inpatients (after your surgery is over and you are in a regular room).      Your procedure is scheduled on: 04-26-22   Report to [facility] Main Entrance    Report to admitting at 11:45AM   Call this number if you have problems the morning of surgery [phone]   Do not eat food :After Midnight.   After Midnight you may have the following liquids until 11:00 AM DAY OF SURGERY  Water Non-Citrus Juices (without pulp, NO RED) Carbonated Beverages Black Coffee (NO MILK/CREAM OR CREAMERS, sugar ok)  Clear Tea (NO MILK/CREAM OR CREAMERS, sugar ok) regular and decaf                             Plain Jell-O (NO RED)                                           Fruit ices (not with fruit pulp, NO RED)                                     Popsicles (NO RED)                                                               Sports drinks like Gatorade (NO RED)                   The day of surgery:  Drink ONE (1) Pre-Surgery G2 at 11:00 AM the morning of surgery. Drink in one sitting. Do not sip.  This drink was given to you during your hospital  pre-op appointment visit. Nothing else to drink after completing the Pre-Surgery G2.          If you have questions, please contact your surgeon's office.   FOLLOW  ANY ADDTIONAL PRE OP INSTRUCTIONS YOU RECEIVED FROM YOUR SURGEON'S OFFICE!!!     Oral Hygiene is also  important to reduce your risk of infection.                                    Remember - BRUSH YOUR TEETH THE MORNING OF SURGERY WITH YOUR REGULAR TOOTHPASTE   Do NOT smoke after Midnight   Take these medicines the morning of surgery with A SIP OF WATER: Amlodipine, Zyrtec, Esomeprazole.  Okay to use Albuterol inhaler  DO NOT TAKE ANY ORAL  DIABETIC MEDICATIONS DAY OF YOUR SURGERY  Bring CPAP mask and tubing day of surgery.                              You may not have any metal on your body including jewelry, and body piercing             Do not wear lotions, powders, cologne, or deodorant              Men may shave face and neck.   Do not bring valuables to the hospital. [place] IS NOT RESPONSIBLE   FOR VALUABLES.   Contacts, dentures or bridgework may not be worn into surgery.  DO NOT BRING YOUR HOME MEDICATIONS TO THE HOSPITAL. PHARMACY WILL DISPENSE MEDICATIONS LISTED ON YOUR MEDICATION LIST TO YOU DURING YOUR ADMISSION IN THE HOSPITAL!   Patients discharged on the day of surgery will not be allowed to drive home.  Someone NEEDS to stay with you for the first 24 hours after anesthesia.  Special Instructions: Bring a copy of your healthcare power of attorney and living will documents the day of surgery if you haven't scanned them before.  Please read over the following fact sheets you were given: IF YOU HAVE QUESTIONS ABOUT YOUR PRE-OP INSTRUCTIONS PLEASE CALL [phone] [facility] - Preparing for Surgery Before surgery, you can play an important role.  Because skin is not sterile, your skin needs to be as free of germs as possible.  You can reduce the number of germs on your skin by washing with CHG (chlorahexidine gluconate) soap before surgery.  CHG is an antiseptic cleaner which kills germs and bonds with the skin to continue killing germs even after washing. Please DO NOT use if you have an allergy to CHG or antibacterial soaps.  If your skin becomes  reddened/irritated stop using the CHG and inform your nurse when you arrive at Short Stay. Do not shave (including legs and underarms) for at least 48 hours prior to the first CHG shower.  You may shave your face/neck.  Please follow these instructions carefully:  1.  Shower with CHG Soap the night before surgery and the  morning of surgery.  2.  If you choose to wash your hair, wash your hair first as usual with your normal  shampoo.  3.  After you shampoo, rinse your hair and body thoroughly to remove the shampoo.                             4.  Use CHG as you would any other liquid soap.  You can apply chg directly to the skin and wash.  Gently with a scrungie or clean washcloth.  5.  Apply the CHG Soap to your body ONLY FROM THE NECK DOWN.   Do   not use on face/ open                           Wound or open sores. Avoid contact with eyes, ears mouth and   genitals (private parts).                       Wash face,  Genitals (private parts) with your normal soap.             6.  Wash thoroughly, paying special  attention to the area where your    surgery  will be performed.  7.  Thoroughly rinse your body with warm water from the neck down.  8.  DO NOT shower/wash with your normal soap after using and rinsing off the CHG Soap.                9.  Pat yourself dry with a clean towel.            10.  Wear clean pajamas.            11.  Place clean sheets on your bed the night of your first shower and do not  sleep with pets. Day of Surgery : Do not apply any lotions/deodorants the morning of surgery.  Please wear clean clothes to the hospital/surgery center.  FAILURE TO FOLLOW THESE INSTRUCTIONS MAY RESULT IN THE CANCELLATION OF YOUR SURGERY  PATIENT SIGNATURE_________________________________  NURSE SIGNATURE__________________________________  ________________________________________________________________________

## 2022-04-19 ENCOUNTER — Telehealth: Payer: Self-pay | Admitting: *Deleted

## 2022-04-19 ENCOUNTER — Encounter: Payer: Self-pay | Admitting: *Deleted

## 2022-04-19 NOTE — Telephone Encounter (Signed)
Called the patient and got his voicemail. I left him the information that Dr. Smith Robert said He should wear compression stockings and take a low dose aspirin. He should try to move around on the flight frequently.  Taking eliquis just for the flight is not absolutely necessary.  He could take eliquis for his entire trip back and forth if he wishes and come off eliquis after he is back too. There is no right or wrong here.  Also sent my chart message .

## 2022-04-19 NOTE — Telephone Encounter (Signed)
He should wear compression stockings and take a low dose aspirin. He should try to move around on the flight frequently. Taking eliquis just for the flight is not absolutely necessary. How long is his trip? He could take eliquis for his entire trip back and forth if he wishes and come off eliquis after he is back too. There is no right or wrong here

## 2022-04-19 NOTE — Telephone Encounter (Signed)
Patient called stating that he is a prior patient of Dr Smith Robert who was on anticoagulation and no longer is. He is calling due to the fact that he is getting ready to take a trip requiring an 8 hor flight and he is asking if he needs to go on anticoagulation so that he does not develop another clot. Please advise.  He has no follow up with you, last seen 04/2021

## 2022-04-20 ENCOUNTER — Encounter (HOSPITAL_COMMUNITY)
Admission: RE | Admit: 2022-04-20 | Discharge: 2022-04-20 | Disposition: A | Discharge status: Home / Self Care | Payer: 59 | Source: Ambulatory Visit | Attending: Surgery | Admitting: Surgery

## 2022-04-20 ENCOUNTER — Other Ambulatory Visit: Payer: Self-pay

## 2022-04-20 ENCOUNTER — Encounter (HOSPITAL_COMMUNITY): Payer: Self-pay

## 2022-04-20 VITALS — BP 121/83 | HR 53 | Temp 98.1°F | Resp 18 | Ht 71.0 in | Wt 236.0 lb

## 2022-04-20 DIAGNOSIS — Z01812 Encounter for preprocedural laboratory examination: Secondary | ICD-10-CM | POA: Insufficient documentation

## 2022-04-20 DIAGNOSIS — I2510 Atherosclerotic heart disease of native coronary artery without angina pectoris: Secondary | ICD-10-CM | POA: Insufficient documentation

## 2022-04-20 DIAGNOSIS — R7303 Prediabetes: Secondary | ICD-10-CM | POA: Diagnosis not present

## 2022-04-20 DIAGNOSIS — Z01818 Encounter for other preprocedural examination: Secondary | ICD-10-CM

## 2022-04-20 LAB — BASIC METABOLIC PANEL
Anion gap: 5 (ref 5–15)
BUN: 17 mg/dL (ref 6–20)
CO2: 27 mmol/L (ref 22–32)
Calcium: 9.2 mg/dL (ref 8.9–10.3)
Chloride: 110 mmol/L (ref 98–111)
Creatinine, Ser: 1.11 mg/dL (ref 0.61–1.24)
GFR, Estimated: >60 mL/min (ref >60)
Glucose, Bld: 94 mg/dL (ref 70–99)
Potassium: 3.9 mmol/L (ref 3.5–5.1)
Sodium: 142 mmol/L (ref 135–145)

## 2022-04-20 LAB — CBC
HCT: 42.7 % (ref 39.0–52.0)
Hemoglobin: 13.6 g/dL (ref 13.0–17.0)
MCH: 26.7 pg (ref 26.0–34.0)
MCHC: 31.9 g/dL (ref 30.0–36.0)
MCV: 83.9 fL (ref 80.0–100.0)
Platelets: 243 10*3/uL (ref 150–400)
RBC: 5.09 MIL/uL (ref 4.22–5.81)
RDW: 12.7 % (ref 11.5–15.5)
WBC: 5.4 10*3/uL (ref 4.0–10.5)
nRBC: 0 % (ref 0.0–0.2)

## 2022-04-20 LAB — GLUCOSE, CAPILLARY: Glucose-Capillary: 99 mg/dL (ref 70–99)

## 2022-04-20 LAB — HEMOGLOBIN A1C
Hgb A1c MFr Bld: 5.5 % (ref 4.8–5.6)
Mean Plasma Glucose: 111.15 mg/dL

## 2022-04-22 NOTE — H&P (Signed)
PROVIDER: Joya San, MD  MRN: [id number] DOB: 1971/06/28  Chief Complaint: New Consultation (Gallbladder)   History of Present Illness: Christopher Burns is a 51 y.o. male who is seen today for follow-up after his acute cholecystitis attack. I reviewed his ultrasound and his CT scan. He is got gallstones although they did not show any evidence of acute cholecystitis at the time. I had spoken with his wife who is also a bariatric patient of mine last week and we have submitted his pre-CERT. 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 We weighed his belt and radio and it was 13.6 pounds.  I discussed lap chole with him. His wife has had the surgery so he is somewhat aware of that. We will go and get this scheduled as soon as he is restarted. He is got a couple of dates next month that are good for him..  Review of Systems: See HPI as well for other ROS.  ROS   Medical History: Past Medical History:  Diagnosis Date  Asthma, unspecified asthma severity, unspecified whether complicated, unspecified whether persistent  Chronic kidney disease  Hyperlipidemia  Hypertension  Sleep apnea   Patient Active Problem List  Diagnosis  BRCA1 gene mutation positive  Pulmonary embolism (CMS-HCC)  Prediabetes  Morbid obesity with alveolar hypoventilation (CMS-HCC)  Mixed hyperlipidemia  Hypertension  Acid reflux  S/P laparoscopic sleeve gastrectomy  OSA on CPAP  Proctalgia  Allergic reaction  Allergic rhinitis  Allergic rhinitis due to animal (cat) (dog) hair and dander  Allergic rhinitis due to pollen  Chest pain due to myocardial ischemia  Dizziness  Facial cellulitis  Family history of BRCA1 gene positive  Family history of breast cancer  Family history of ovarian cancer  Family history of prostate cancer  Flatulence, eructation and gas pain  CPAP use counseling  Genetic testing  [facility]  Hypogonadism in male   LBBB (left bundle branch block)  Long term current use of anticoagulant  Mild intermittent asthma  Polycythemia  Swelling of upper lip  Thrombosed external hemorrhoids  Hypertension  High cholesterol  Class 1 obesity with serious comorbidity and body mass index (BMI) of 33.0 to 33.9 in adult  OSA on CPAP  Pulmonary embolism (CMS-HCC)   Past Surgical History:  Procedure Laterality Date  HERNIA REPAIR 88/50/2774  umbilical hernia  pulmonary thrombectomy 10/23/2020  LAPAROSCOPIC SLEEVE GASTRECTOMY N/A 06/15/2021    Allergies  Allergen Reactions  Lisinopril Swelling and Other (See Comments)  Angioedema   Current Outpatient Medications on File Prior to Visit  Medication Sig Dispense Refill  montelukast (SINGULAIR) 10 mg tablet Take 10 mg by mouth once daily  multivitamin Chew Take by mouth  multivitamin with minerals tablet Take 1 tablet by mouth once daily  simvastatin (ZOCOR) 10 MG tablet Take 10 mg by mouth nightly  ascorbic acid, vitamin C, (VITAMIN C) 500 MG tablet Take by mouth (Patient not taking: Reported on 03/30/2022)  cetirizine (ZYRTEC) 10 MG tablet Take 10 mg by mouth once daily (Patient not taking: Reported on 03/30/2022)  cholecalciferol (VITAMIN D3) 1000 unit tablet Take by mouth (Patient not taking: Reported on 03/30/2022)  cyanocobalamin (VITAMIN B12) 100 MCG tablet Take by mouth (Patient not taking: Reported on 03/30/2022)  ELIQUIS 5 mg tablet (Patient not taking: Reported on 03/30/2022)  esomeprazole (NEXIUM) 40 MG DR capsule Take 40 mg by mouth once daily (Patient not taking: Reported on 03/30/2022)  metFORMIN (GLUCOPHAGE) 500 MG tablet Take 500 mg  by mouth daily with breakfast (Patient not taking: Reported on 03/30/2022)  omega-3 acid ethyl esters (LOVAZA) 1 gram capsule Take by mouth (Patient not taking: Reported on 03/30/2022)  zinc gluconate 50 mg tablet Take by mouth (Patient not taking: Reported on 03/30/2022)   No current facility-administered medications on  file prior to visit.   Family History  Problem Relation Age of Onset  Hyperlipidemia (Elevated cholesterol) Mother  Breast cancer Mother  Diabetes Father  High blood pressure (Hypertension) Father  Hyperlipidemia (Elevated cholesterol) Father  Cancer Maternal Grandmother  Prostate cancer Paternal Grandfather    Social History   Tobacco Use  Smoking Status Never  Smokeless Tobacco Never    Social History   Socioeconomic History  Marital status: Married  Tobacco Use  Smoking status: Never  Smokeless tobacco: Never  Vaping Use  Vaping Use: Never used  Substance and Sexual Activity  Alcohol use: Never  Drug use: Never   Objective:   There were no vitals filed for this visit.  There is no height or weight on file to calculate BMI.  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 Plan laparoscopic cholecystectomy soon.  No follow-ups on file.  Janalyn Higby Donia Pounds, MD

## 2022-04-25 NOTE — Anesthesia Preprocedure Evaluation (Signed)
Anesthesia Evaluation  Patient identified by MRN, date of birth, ID band Patient awake    Reviewed: Allergy & Precautions, NPO status , Patient's Chart, lab work & pertinent test results  History of Anesthesia Complications Negative for: history of anesthetic complications  Airway Mallampati: III  TM Distance: >3 FB Neck ROM: Full    Dental  (+) Missing,    Pulmonary asthma , sleep apnea and Continuous Positive Airway Pressure Ventilation , PE (s/p thrombectomy 2022)   Pulmonary exam normal        Cardiovascular hypertension, Pt. on medications Normal cardiovascular exam     Neuro/Psych negative neurological ROS  negative psych ROS   GI/Hepatic Neg liver ROS, GERD  Medicated and Controlled,S/p sleeve gastrectomy 2022   Endo/Other  negative endocrine ROS  Renal/GU negative Renal ROS  negative genitourinary   Musculoskeletal negative musculoskeletal ROS (+)   Abdominal   Peds  Hematology negative hematology ROS (+)   Anesthesia Other Findings Day of surgery medications reviewed with patient.  Reproductive/Obstetrics negative OB ROS                           Anesthesia Physical Anesthesia Plan  ASA: 3  Anesthesia Plan: General   Post-op Pain Management: Tylenol PO (pre-op)* and Toradol IV (intra-op)*   Induction: Intravenous  PONV Risk Score and Plan: 3 and Treatment may vary due to age or medical condition, Midazolam, Dexamethasone and Ondansetron  Airway Management Planned: Oral ETT  Additional Equipment: None  Intra-op Plan:   Post-operative Plan: Extubation in OR  Informed Consent: I have reviewed the patients History and Physical, chart, labs and discussed the procedure including the risks, benefits and alternatives for the proposed anesthesia with the patient or authorized representative who has indicated his/her understanding and acceptance.     Dental advisory  given  Plan Discussed with: CRNA  Anesthesia Plan Comments:        Anesthesia Quick Evaluation

## 2022-04-26 ENCOUNTER — Encounter (HOSPITAL_COMMUNITY): Payer: Self-pay | Admitting: Surgery

## 2022-04-26 ENCOUNTER — Ambulatory Visit (HOSPITAL_COMMUNITY): Payer: 59

## 2022-04-26 ENCOUNTER — Encounter (HOSPITAL_COMMUNITY)
Admission: RE | Disposition: A | Discharge status: Home / Self Care | Payer: Self-pay | Source: Home / Self Care | Attending: Surgery

## 2022-04-26 ENCOUNTER — Ambulatory Visit (HOSPITAL_BASED_OUTPATIENT_CLINIC_OR_DEPARTMENT_OTHER): Payer: 59 | Admitting: Anesthesiology

## 2022-04-26 ENCOUNTER — Ambulatory Visit (HOSPITAL_COMMUNITY)
Admission: RE | Admit: 2022-04-26 | Discharge: 2022-04-26 | Disposition: A | Discharge status: Home / Self Care | Payer: 59 | Attending: Surgery | Admitting: Surgery

## 2022-04-26 ENCOUNTER — Ambulatory Visit (HOSPITAL_COMMUNITY): Payer: 59 | Admitting: Physician Assistant

## 2022-04-26 ENCOUNTER — Other Ambulatory Visit: Payer: Self-pay

## 2022-04-26 DIAGNOSIS — N183 Chronic kidney disease, stage 3 unspecified: Secondary | ICD-10-CM | POA: Diagnosis not present

## 2022-04-26 DIAGNOSIS — N368 Other specified disorders of urethra: Secondary | ICD-10-CM

## 2022-04-26 DIAGNOSIS — K8012 Calculus of gallbladder with acute and chronic cholecystitis without obstruction: Secondary | ICD-10-CM | POA: Diagnosis present

## 2022-04-26 DIAGNOSIS — G4733 Obstructive sleep apnea (adult) (pediatric): Secondary | ICD-10-CM | POA: Diagnosis not present

## 2022-04-26 DIAGNOSIS — I129 Hypertensive chronic kidney disease with stage 1 through stage 4 chronic kidney disease, or unspecified chronic kidney disease: Secondary | ICD-10-CM | POA: Diagnosis not present

## 2022-04-26 DIAGNOSIS — I10 Essential (primary) hypertension: Secondary | ICD-10-CM

## 2022-04-26 DIAGNOSIS — G4730 Sleep apnea, unspecified: Secondary | ICD-10-CM | POA: Diagnosis not present

## 2022-04-26 DIAGNOSIS — K219 Gastro-esophageal reflux disease without esophagitis: Secondary | ICD-10-CM | POA: Insufficient documentation

## 2022-04-26 DIAGNOSIS — Z9989 Dependence on other enabling machines and devices: Secondary | ICD-10-CM | POA: Diagnosis not present

## 2022-04-26 DIAGNOSIS — R7303 Prediabetes: Secondary | ICD-10-CM

## 2022-04-26 HISTORY — PX: CHOLECYSTECTOMY: SHX55

## 2022-04-26 LAB — GLUCOSE, CAPILLARY: Glucose-Capillary: 80 mg/dL (ref 70–99)

## 2022-04-26 SURGERY — LAPAROSCOPIC CHOLECYSTECTOMY WITH INTRAOPERATIVE CHOLANGIOGRAM
Anesthesia: General | Site: Abdomen

## 2022-04-26 MED ORDER — ACETAMINOPHEN 500 MG PO TABS
1000.0000 mg | ORAL_TABLET | ORAL | Status: AC
  Administered 2022-04-26: 1000 mg via ORAL
  Filled 2022-04-26: qty 2

## 2022-04-26 MED ORDER — ONDANSETRON HCL 4 MG/2ML IJ SOLN
INTRAMUSCULAR | Status: AC
  Filled 2022-04-26: qty 2

## 2022-04-26 MED ORDER — DEXAMETHASONE SODIUM PHOSPHATE 10 MG/ML IJ SOLN
INTRAMUSCULAR | Status: DC | PRN
  Administered 2022-04-26: 4 mg via INTRAVENOUS

## 2022-04-26 MED ORDER — ROCURONIUM BROMIDE 100 MG/10ML IV SOLN
INTRAVENOUS | Status: DC | PRN
  Administered 2022-04-26: 10 mg via INTRAVENOUS
  Administered 2022-04-26: 60 mg via INTRAVENOUS
  Administered 2022-04-26: 10 mg via INTRAVENOUS

## 2022-04-26 MED ORDER — CHLORHEXIDINE GLUCONATE CLOTH 2 % EX PADS: 6.0000 | MEDICATED_PAD | Freq: Once | CUTANEOUS | Status: DC

## 2022-04-26 MED ORDER — 0.9 % SODIUM CHLORIDE (POUR BTL) OPTIME
TOPICAL | Status: DC | PRN
  Administered 2022-04-26: 1000 mL

## 2022-04-26 MED ORDER — SODIUM CHLORIDE (PF) 0.9 % IJ SOLN
INTRAMUSCULAR | Status: DC | PRN
  Administered 2022-04-26: 7 mL

## 2022-04-26 MED ORDER — DEXAMETHASONE SODIUM PHOSPHATE 10 MG/ML IJ SOLN
INTRAMUSCULAR | Status: AC
  Filled 2022-04-26: qty 1

## 2022-04-26 MED ORDER — BUPIVACAINE LIPOSOME 1.3 % IJ SUSP
INTRAMUSCULAR | Status: DC | PRN
  Administered 2022-04-26: 20 mL

## 2022-04-26 MED ORDER — BUPIVACAINE LIPOSOME 1.3 % IJ SUSP
INTRAMUSCULAR | Status: AC
  Filled 2022-04-26: qty 20

## 2022-04-26 MED ORDER — FENTANYL CITRATE (PF) 100 MCG/2ML IJ SOLN
INTRAMUSCULAR | Status: AC
  Filled 2022-04-26: qty 2

## 2022-04-26 MED ORDER — OXYCODONE HCL 5 MG/5ML PO SOLN: 5.0000 mg | Freq: Once | ORAL | Status: AC | PRN

## 2022-04-26 MED ORDER — OXYCODONE HCL 5 MG PO TABS
5.0000 mg | ORAL_TABLET | Freq: Once | ORAL | Status: AC | PRN
  Administered 2022-04-26: 5 mg via ORAL

## 2022-04-26 MED ORDER — CEFAZOLIN SODIUM-DEXTROSE 2-4 GM/100ML-% IV SOLN
2.0000 g | INTRAVENOUS | Status: AC
  Administered 2022-04-26: 2 g via INTRAVENOUS
  Filled 2022-04-26: qty 100

## 2022-04-26 MED ORDER — AMISULPRIDE (ANTIEMETIC) 5 MG/2ML IV SOLN: 10.0000 mg | Freq: Once | INTRAVENOUS | Status: DC | PRN

## 2022-04-26 MED ORDER — OXYCODONE HCL 5 MG PO TABS: 5.0000 mg | ORAL_TABLET | Freq: Four times a day (QID) | ORAL | 0 refills | Status: DC | PRN

## 2022-04-26 MED ORDER — SUGAMMADEX SODIUM 500 MG/5ML IV SOLN
INTRAVENOUS | Status: DC | PRN
  Administered 2022-04-26: 220 mg via INTRAVENOUS

## 2022-04-26 MED ORDER — LIDOCAINE HCL (PF) 2 % IJ SOLN
INTRAMUSCULAR | Status: AC
  Filled 2022-04-26: qty 5

## 2022-04-26 MED ORDER — MIDAZOLAM HCL 2 MG/2ML IJ SOLN
INTRAMUSCULAR | Status: AC
  Filled 2022-04-26: qty 2

## 2022-04-26 MED ORDER — ONDANSETRON HCL 4 MG/2ML IJ SOLN
INTRAMUSCULAR | Status: DC | PRN
  Administered 2022-04-26: 4 mg via INTRAVENOUS

## 2022-04-26 MED ORDER — LIDOCAINE HCL (CARDIAC) PF 100 MG/5ML IV SOSY
PREFILLED_SYRINGE | INTRAVENOUS | Status: DC | PRN
  Administered 2022-04-26: 60 mg via INTRAVENOUS

## 2022-04-26 MED ORDER — PROPOFOL 10 MG/ML IV BOLUS
INTRAVENOUS | Status: DC | PRN
  Administered 2022-04-26: 200 mg via INTRAVENOUS

## 2022-04-26 MED ORDER — PROPOFOL 10 MG/ML IV BOLUS
INTRAVENOUS | Status: AC
  Filled 2022-04-26: qty 20

## 2022-04-26 MED ORDER — CHLORHEXIDINE GLUCONATE 0.12 % MT SOLN
15.0000 mL | Freq: Once | OROMUCOSAL | Status: AC
  Administered 2022-04-26: 15 mL via OROMUCOSAL

## 2022-04-26 MED ORDER — FENTANYL CITRATE (PF) 100 MCG/2ML IJ SOLN
INTRAMUSCULAR | Status: DC | PRN
  Administered 2022-04-26: 100 ug via INTRAVENOUS

## 2022-04-26 MED ORDER — ORAL CARE MOUTH RINSE: 15.0000 mL | Freq: Once | OROMUCOSAL | Status: AC

## 2022-04-26 MED ORDER — SCOPOLAMINE 1 MG/3DAYS TD PT72
1.0000 | MEDICATED_PATCH | TRANSDERMAL | Status: DC
  Administered 2022-04-26: 1.5 mg via TRANSDERMAL
  Filled 2022-04-26: qty 1

## 2022-04-26 MED ORDER — SUGAMMADEX SODIUM 500 MG/5ML IV SOLN
INTRAVENOUS | Status: AC
  Filled 2022-04-26: qty 5

## 2022-04-26 MED ORDER — FENTANYL CITRATE PF 50 MCG/ML IJ SOSY
PREFILLED_SYRINGE | INTRAMUSCULAR | Status: AC
  Filled 2022-04-26: qty 1

## 2022-04-26 MED ORDER — OXYCODONE HCL 5 MG PO TABS
ORAL_TABLET | ORAL | Status: AC
  Filled 2022-04-26: qty 1

## 2022-04-26 MED ORDER — LACTATED RINGERS IR SOLN
Status: DC | PRN
  Administered 2022-04-26: 1000 mL

## 2022-04-26 MED ORDER — LACTATED RINGERS IV SOLN: INTRAVENOUS | Status: DC

## 2022-04-26 MED ORDER — MIDAZOLAM HCL 5 MG/5ML IJ SOLN
INTRAMUSCULAR | Status: DC | PRN
  Administered 2022-04-26: 2 mg via INTRAVENOUS

## 2022-04-26 MED ORDER — ACETAMINOPHEN 500 MG PO TABS: 1000.0000 mg | ORAL_TABLET | Freq: Once | ORAL | Status: DC

## 2022-04-26 MED ORDER — ROCURONIUM BROMIDE 10 MG/ML (PF) SYRINGE
PREFILLED_SYRINGE | INTRAVENOUS | Status: AC
  Filled 2022-04-26: qty 10

## 2022-04-26 MED ORDER — FENTANYL CITRATE PF 50 MCG/ML IJ SOSY
25.0000 ug | PREFILLED_SYRINGE | INTRAMUSCULAR | Status: DC | PRN
  Administered 2022-04-26 (×2): 25 ug via INTRAVENOUS

## 2022-04-26 SURGICAL SUPPLY — 42 items
APPLICATOR COTTON TIP 6 STRL (MISCELLANEOUS) ×2 IMPLANT
APPLICATOR COTTON TIP 6IN STRL (MISCELLANEOUS) ×2
APPLIER CLIP 5 13 M/L LIGAMAX5 (MISCELLANEOUS)
APPLIER CLIP ROT 10 11.4 M/L (STAPLE) ×1
BAG COUNTER SPONGE SURGICOUNT (BAG) IMPLANT
CABLE HIGH FREQUENCY MONO STRZ (ELECTRODE) IMPLANT
CATH REDDICK CHOLANGI 4FR 50CM (CATHETERS) ×1 IMPLANT
CLIP APPLIE 5 13 M/L LIGAMAX5 (MISCELLANEOUS) IMPLANT
CLIP APPLIE ROT 10 11.4 M/L (STAPLE) IMPLANT
COVER MAYO STAND XLG (MISCELLANEOUS) ×1 IMPLANT
COVER SURGICAL LIGHT HANDLE (MISCELLANEOUS) ×1 IMPLANT
DERMABOND ADVANCED .7 DNX12 (GAUZE/BANDAGES/DRESSINGS) ×1 IMPLANT
DRAPE C-ARM 42X120 X-RAY (DRAPES) ×1 IMPLANT
ELECT L-HOOK LAP 45CM DISP (ELECTROSURGICAL) ×1
ELECT PENCIL ROCKER SW 15FT (MISCELLANEOUS) ×1 IMPLANT
ELECT REM PT RETURN 15FT ADLT (MISCELLANEOUS) ×1 IMPLANT
ELECTRODE L-HOOK LAP 45CM DISP (ELECTROSURGICAL) ×1 IMPLANT
GLOVE SURG LX STRL 8.0 MICRO (GLOVE) ×1 IMPLANT
GOWN STRL REUS W/ TWL XL LVL3 (GOWN DISPOSABLE) ×1 IMPLANT
GOWN STRL REUS W/TWL XL LVL3 (GOWN DISPOSABLE) ×1
HEMOSTAT SURGICEL 4X8 (HEMOSTASIS) IMPLANT
IRRIG SUCT STRYKERFLOW 2 WTIP (MISCELLANEOUS) ×1
IRRIGATION SUCT STRKRFLW 2 WTP (MISCELLANEOUS) ×1 IMPLANT
IV CATH 14GX2 1/4 (CATHETERS) ×1 IMPLANT
KIT BASIN OR (CUSTOM PROCEDURE TRAY) ×1 IMPLANT
KIT TURNOVER KIT A (KITS) IMPLANT
POUCH RETRIEVAL ECOSAC 10 (ENDOMECHANICALS) IMPLANT
POUCH RETRIEVAL ECOSAC 10MM (ENDOMECHANICALS)
SCISSORS LAP 5X45 EPIX DISP (ENDOMECHANICALS) ×1 IMPLANT
SET TUBE SMOKE EVAC HIGH FLOW (TUBING) ×1 IMPLANT
SLEEVE ADV FIXATION 5X100MM (TROCAR) ×1 IMPLANT
SPIKE FLUID TRANSFER (MISCELLANEOUS) ×1 IMPLANT
SUT MNCRL AB 4-0 PS2 18 (SUTURE) ×2 IMPLANT
SYR 20ML LL LF (SYRINGE) ×1 IMPLANT
SYS BAG RETRIEVAL 10MM (BASKET) ×1
SYSTEM BAG RETRIEVAL 10MM (BASKET) IMPLANT
TOWEL OR 17X26 10 PK STRL BLUE (TOWEL DISPOSABLE) ×1 IMPLANT
TRAY LAPAROSCOPIC (CUSTOM PROCEDURE TRAY) ×1 IMPLANT
TROCAR 11X100 Z THREAD (TROCAR) IMPLANT
TROCAR ADV FIXATION 5X100MM (TROCAR) ×1 IMPLANT
TROCAR BALLN 12MMX100 BLUNT (TROCAR) IMPLANT
TROCAR XCEL NON-BLD 5MMX100MML (ENDOMECHANICALS) IMPLANT

## 2022-04-26 NOTE — Transfer of Care (Signed)
Immediate Anesthesia Transfer of Care Note  Patient: Christopher Burns  Procedure(s) Performed: LAPAROSCOPIC CHOLECYSTECTOMY WITH INTRAOPERATIVE CHOLANGIOGRAM (Abdomen)  Patient Location: PACU  Anesthesia Type:General  Level of Consciousness: awake, alert  and oriented  Airway & Oxygen Therapy: Patient Spontanous Breathing and Patient connected to face mask oxygen  Post-op Assessment: Report given to RN, Post -op Vital signs reviewed and stable and Patient moving all extremities X 4  Post vital signs: Reviewed and stable  Last Vitals:  Vitals Value Taken Time  BP 149/80   Temp    Pulse 64 04/26/22 1557  Resp 26 04/26/22 1557  SpO2 100 % 04/26/22 1557  Vitals shown include unvalidated device data.  Last Pain:  Vitals:   04/26/22 1204  TempSrc:   PainSc: 0-No pain         Complications: No notable events documented.

## 2022-04-26 NOTE — Interval H&P Note (Signed)
History and Physical Interval Note:  04/26/2022 12:34 PM  Christopher Burns  has presented today for surgery, with the diagnosis of GALLSTONES.  The various methods of treatment have been discussed with the patient and family. After consideration of risks, benefits and other options for treatment, the patient has consented to  Procedure(s): LAPAROSCOPIC CHOLECYSTECTOMY WITH INTRAOPERATIVE CHOLANGIOGRAM (N/A) as a surgical intervention.  The patient's history has been reviewed, patient examined, no change in status, stable for surgery.  I have reviewed the patient's chart and labs.  Questions were answered to the patient's satisfaction.     Valarie Merino

## 2022-04-26 NOTE — Interval H&P Note (Signed)
History and Physical Interval Note:  04/26/2022 12:23 PM  Christopher Burns  has presented today for surgery, with the diagnosis of GALLSTONES.  The various methods of treatment have been discussed with the patient and family. After consideration of risks, benefits and other options for treatment, the patient has consented to  Procedure(s): LAPAROSCOPIC CHOLECYSTECTOMY WITH INTRAOPERATIVE CHOLANGIOGRAM (N/A) as a surgical intervention.  The patient's history has been reviewed, patient examined, no change in status, stable for surgery.  I have reviewed the patient's chart and labs.  Questions were answered to the patient's satisfaction.     Valarie Merino

## 2022-04-26 NOTE — Anesthesia Postprocedure Evaluation (Signed)
Anesthesia Post Note  Patient: Christopher Burns  Procedure(s) Performed: LAPAROSCOPIC CHOLECYSTECTOMY WITH INTRAOPERATIVE CHOLANGIOGRAM (Abdomen)     Patient location during evaluation: PACU Anesthesia Type: General Level of consciousness: awake and alert Pain management: pain level controlled Vital Signs Assessment: post-procedure vital signs reviewed and stable Respiratory status: spontaneous breathing, nonlabored ventilation, respiratory function stable and patient connected to nasal cannula oxygen Cardiovascular status: blood pressure returned to baseline and stable Postop Assessment: no apparent nausea or vomiting Anesthetic complications: no   No notable events documented.  Last Vitals:  Vitals:   04/26/22 1600 04/26/22 1615  BP: (!) 153/77 (!) 151/78  Pulse: 72 68  Resp: 18 18  Temp:    SpO2: 98% 96%    Last Pain:  Vitals:   04/26/22 1556  TempSrc:   PainSc: 0-No pain                 Kennieth Rad

## 2022-04-26 NOTE — Op Note (Signed)
[facility]  Primary Care Physician:  Corky Downs, MD    04/26/2022  3:46 PM  Procedure: Laparoscopic Cholecystectomy with intraoperative cholangiogram  Surgeon: Susy Frizzle B. Daphine Deutscher, MD, FACS Asst:  none  Anes:  General  Drains:  None  Findings: Subacute and chronic cholecystitis; normal IOC  Description of Procedure: The patient was taken to OR 4 and given general anesthesia.  The patient was prepped with chlorohexidine prep and draped sterilely. A time out was performed including identifying the patient and discussing their procedure.  Access to the abdomen was achieved with a 5 mm Optiview through the right upper quadrant.  Port placement included three 5 mm trocars and one 11 mm trocar.    The gallbladder was visualized and appeared white, stuck to the surrounding tissue, somewhat contracted and consistent with recurrent chronic cholecystitis.   The fundus of the gallbaldder was grasped and the gallbladder was elevated. Traction on the infundibulum allowed for successful demonstration of the critical view. Inflammatory changes were chronic.  The cystic duct was identified and clipped up on the gallbladder and an incision was made in the cystic duct and the Reddick catheter was inserted after milking the cystic duct of any debris. A dynamic cholangiogram was performed which demonstrated long cystic duct, free flow into the duodenum and intrahepatic filling.    The cystic duct was then triple clipped and divided, the cystic artery was double clipped and divided and then the gallbladder was removed from the gallbladder bed. Removal of the gallbladder from the gallbladder bed was performed without entering it .  The bed was cauterized and while extracting I placed a bit of surgicel in the bed.  The bed was felt to be dry and the surgicel was removed.    The gallbladder was then placed in a bag and brought out through the upper midline the trocar site.  The port was closed with a single 0 vicryl  with the PMI.  The gallbladder bed was inspected and no bleeding or bile leaks were seen.   Laparoscopic visualization was used when closing fascial defects for trocar sites.   Incisions were injected with Exparel 20 cc and closed with 4-0 Monocryl and Dermabond on the skin.  Sponge and needle count were correct.    The patient was taken to the recovery room in satisfactory condition.

## 2022-04-26 NOTE — Anesthesia Procedure Notes (Signed)
Procedure Name: Intubation Date/Time: 04/26/2022 2:20 PM  Performed by: Niel Hummer, CRNAPre-anesthesia Checklist: Emergency Drugs available, Patient identified, Suction available and Patient being monitored Patient Re-evaluated:Patient Re-evaluated prior to induction Oxygen Delivery Method: Circle system utilized Preoxygenation: Pre-oxygenation with 100% oxygen Induction Type: IV induction Ventilation: Mask ventilation without difficulty and Oral airway inserted - appropriate to patient size Laryngoscope Size: Mac and 4 Grade View: Grade I Tube type: Oral Tube size: 7.5 mm Number of attempts: 1 Airway Equipment and Method: Stylet Placement Confirmation: ETT inserted through vocal cords under direct vision, positive ETCO2 and breath sounds checked- equal and bilateral Secured at: 23 cm Tube secured with: Tape Dental Injury: Teeth and Oropharynx as per pre-operative assessment

## 2022-04-27 ENCOUNTER — Encounter (HOSPITAL_COMMUNITY): Payer: Self-pay | Admitting: Surgery

## 2022-04-28 LAB — SURGICAL PATHOLOGY

## 2022-06-13 ENCOUNTER — Encounter (INDEPENDENT_AMBULATORY_CARE_PROVIDER_SITE_OTHER): Payer: Self-pay

## 2022-06-15 ENCOUNTER — Other Ambulatory Visit: Payer: Self-pay | Admitting: Internal Medicine

## 2022-06-29 ENCOUNTER — Ambulatory Visit (INDEPENDENT_AMBULATORY_CARE_PROVIDER_SITE_OTHER): Payer: 59 | Admitting: Podiatry

## 2022-06-29 DIAGNOSIS — M7751 Other enthesopathy of right foot: Secondary | ICD-10-CM | POA: Diagnosis not present

## 2022-06-29 DIAGNOSIS — Q828 Other specified congenital malformations of skin: Secondary | ICD-10-CM

## 2022-06-29 MED ORDER — TRIAMCINOLONE ACETONIDE 10 MG/ML IJ SUSP
10.0000 mg | Freq: Once | INTRAMUSCULAR | Status: AC
  Administered 2022-06-29: 10 mg

## 2022-06-29 NOTE — Progress Notes (Signed)
Subjective:   Patient ID: Christopher Burns, male   DOB: 51 y.o.   MRN: [id number]   HPI Patient presents stating that he has developed a lot of inflammation around the outside of the right foot with fluid buildup and also has chronic lesion formation that did do well for a good period of time   ROS      Objective:  Physical Exam  Ocular status intact inflammation fluid around the fifth MPJ right with a separate lesion formation more proximal that is painful when pressed     Assessment:  Inflammatory capsulitis fifth MPJ right with pain along with chronic porokeratotic lesion separate     Plan:  H&P reviewed condition sterile prep injected the fifth MPJ 3 mg dexamethasone Kenalog 5 mg Xylocaine sterile sharp debridement of lesion no iatrogenic bleeding reappoint routine care

## 2022-08-09 ENCOUNTER — Other Ambulatory Visit: Payer: Self-pay | Admitting: Internal Medicine

## 2022-08-28 ENCOUNTER — Other Ambulatory Visit: Payer: Self-pay | Admitting: Internal Medicine

## 2022-10-27 ENCOUNTER — Encounter: Payer: Self-pay | Admitting: Nurse Practitioner

## 2022-10-27 ENCOUNTER — Ambulatory Visit: Payer: 59 | Admitting: Nurse Practitioner

## 2022-10-27 VITALS — BP 132/78 | HR 73 | Temp 98.7°F | Resp 16 | Ht 71.0 in | Wt 245.0 lb

## 2022-10-27 DIAGNOSIS — I10 Essential (primary) hypertension: Secondary | ICD-10-CM

## 2022-10-27 DIAGNOSIS — Z0000 Encounter for general adult medical examination without abnormal findings: Secondary | ICD-10-CM

## 2022-10-27 DIAGNOSIS — Z125 Encounter for screening for malignant neoplasm of prostate: Secondary | ICD-10-CM

## 2022-10-27 DIAGNOSIS — E669 Obesity, unspecified: Secondary | ICD-10-CM

## 2022-10-27 DIAGNOSIS — R7303 Prediabetes: Secondary | ICD-10-CM

## 2022-10-27 DIAGNOSIS — Z9884 Bariatric surgery status: Secondary | ICD-10-CM | POA: Diagnosis not present

## 2022-10-27 DIAGNOSIS — G4733 Obstructive sleep apnea (adult) (pediatric): Secondary | ICD-10-CM | POA: Diagnosis not present

## 2022-10-27 DIAGNOSIS — E7800 Pure hypercholesterolemia, unspecified: Secondary | ICD-10-CM

## 2022-10-27 MED ORDER — AMLODIPINE BESYLATE 5 MG PO TABS: 5.0000 mg | ORAL_TABLET | Freq: Every day | ORAL | 3 refills | Status: DC

## 2022-10-27 NOTE — Assessment & Plan Note (Signed)
Discussed age-appropriate immunizations and screening exams.  Did review patient's personal, surgical, social, family history.  Patient was given information about preventative healthcare maintenance with anticipatory guidance for his age range.  Patient is up-to-date on his tetanus vaccine, can get flu vaccine at his employer.  Did discuss shingles vaccine.  Patient is up-to-date on COVID vaccines.  Patient is up-to-date on CRC screening.  Will do PSA today.

## 2022-10-27 NOTE — Assessment & Plan Note (Signed)
Patient currently maintained on amlodipine 5 mg.  Blood pressure was in normal limits.  He does have the ability to check blood pressure at home.  Continue taking medication as prescribed

## 2022-10-27 NOTE — Assessment & Plan Note (Signed)
Pending B12 and vitamin D levels

## 2022-10-27 NOTE — Assessment & Plan Note (Signed)
Historical diagnosis pending labs today.  Patient has fasted over the past 4 hours

## 2022-10-27 NOTE — Assessment & Plan Note (Signed)
Continue working on healthy lifestyle modifications 

## 2022-10-27 NOTE — Progress Notes (Signed)
Established Patient Office Visit  Subjective   Patient ID: Christopher Burns, male    DOB: 1971/02/14  Age: 52 y.o. MRN: [id number]  Chief Complaint  Patient presents with   Establish Care    HPI  HTN: does have a machine at home. States tolerates the medication well   Asthma: states that he does not use it often. Does not wake up at night or gasping for air. Patient is seen and evaluated by asthma and allergy and his allergy shots.  OSA: Has a CPAP. 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  Patient would like to look into different devices besides CPAP such as oral device or inspire.  Sleeve gastrectomy: states that he had it in 2022.  He did have good result.  He ended up getting gallstones recently had his gallbladder removed in September 2023.    for complete physical and follow up of chronic conditions.  Immunizations: -Tetanus: Completed in 2018 -Influenza: Can get his employer -Shingles: Information given today -Pneumonia: Too young  Diet: Fair diet. States that he is doing 2 meals a day. States that he will have a water and coffee. Sometimes an energy Exercise:  3-4 times a week. 45- 1 hour at at time. He will do cardio and weight   Eye exam: Completes annually. Glasses  Dental exam: Completes semi-annually    Colonoscopy: Completed in 2020 recall 10 years Lung Cancer Screening: NA   PSA: Due  Sleep: states he gets 6-7 hours of sleep. 12 bedtime and gets up at 7 am.  Does not snore    Review of Systems  Constitutional:  Negative for chills and fever.  Respiratory:  Negative for shortness of breath.   Cardiovascular:  Negative for chest pain and leg swelling.  Gastrointestinal:  Negative for abdominal pain, blood in stool, constipation, diarrhea, nausea and vomiting.       BM every other day   Genitourinary:  Negative for dysuria and hematuria.  Neurological:  Negative for tingling and headaches.  Psychiatric/Behavioral:  Negative for hallucinations and  suicidal ideas.       Objective:     BP 132/78   Pulse 73   Temp 98.7 F (37.1 C)   Resp 16   Ht '5\' 11"'$  (1.803 m)   Wt 245 lb (111.1 kg)   SpO2 96%   BMI 34.17 kg/m    Physical Exam Vitals and nursing note reviewed.  Constitutional:      Appearance: Normal appearance.  HENT:     Right Ear: Tympanic membrane, ear canal and external ear normal.     Left Ear: Tympanic membrane, ear canal and external ear normal.     Mouth/Throat:     Mouth: Mucous membranes are moist.     Pharynx: Oropharynx is clear.  Eyes:     Extraocular Movements: Extraocular movements intact.     Pupils: Pupils are equal, round, and reactive to light.  Cardiovascular:     Rate and Rhythm: Normal rate and regular rhythm.     Pulses: Normal pulses.     Heart sounds: Normal heart sounds.  Pulmonary:     Effort: Pulmonary effort is normal.     Breath sounds: Normal breath sounds.  Abdominal:     General: Bowel sounds are normal. There is no distension.     Palpations: There is no mass.     Tenderness: There is no abdominal tenderness.     Hernia: No hernia is present.  Genitourinary:  Comments: deferred Musculoskeletal:     Right lower leg: No edema.     Left lower leg: No edema.  Lymphadenopathy:     Cervical: No cervical adenopathy.  Skin:    General: Skin is warm.  Neurological:     General: No focal deficit present.     Mental Status: He is alert.     Deep Tendon Reflexes:     Reflex Scores:      Bicep reflexes are 2+ on the right side and 2+ on the left side.      Patellar reflexes are 2+ on the right side and 2+ on the left side.    Comments: Bilateral upper and lower extremity strength 5/5  Psychiatric:        Mood and Affect: Mood normal.        Behavior: Behavior normal.        Thought Content: Thought content normal.        Judgment: Judgment normal.      No results found for any visits on 10/27/22.    The 10-year ASCVD risk score (Arnett DK, et al., 2019) is:  6.6%    Assessment & Plan:   Problem List Items Addressed This Visit       Cardiovascular and Mediastinum   Hypertension    Patient currently maintained on amlodipine 5 mg.  Blood pressure was in normal limits.  He does have the ability to check blood pressure at home.  Continue taking medication as prescribed      Relevant Medications   amLODipine (NORVASC) 5 MG tablet   Other Relevant Orders   CBC   Comprehensive metabolic panel   TSH     Respiratory   OSA on CPAP    States he is to 6 to 7 hours of restful sleep on the CPAP.  He is interested in alternative therapies such as oral appliances or inspire.  Ambulatory referral to pulmonology      Relevant Orders   Ambulatory referral to Pulmonology     Other   High cholesterol    Historical diagnosis pending labs today.  Patient has fasted over the past 4 hours      Relevant Medications   amLODipine (NORVASC) 5 MG tablet   Other Relevant Orders   Lipid panel   Prediabetes    Patient is status post gastric sleeve.  Will check A1c today.  Continue healthy lifestyle modifications      Relevant Orders   Hemoglobin A1c   Lipid panel   Preventative health care - Primary    Discussed age-appropriate immunizations and screening exams.  Did review patient's personal, surgical, social, family history.  Patient was given information about preventative healthcare maintenance with anticipatory guidance for his age range.  Patient is up-to-date on his tetanus vaccine, can get flu vaccine at his employer.  Did discuss shingles vaccine.  Patient is up-to-date on COVID vaccines.  Patient is up-to-date on CRC screening.  Will do PSA today.      Relevant Orders   CBC   Comprehensive metabolic panel   TSH   S/P laparoscopic sleeve gastrectomy    Pending B12 and vitamin D levels      Relevant Orders   Vitamin B12   VITAMIN D 25 Hydroxy (Vit-D Deficiency, Fractures)   Obesity (BMI 30-39.9)    Continue working on healthy lifestyle  modifications.      Relevant Orders   Hemoglobin A1c   TSH   Lipid panel  Other Visit Diagnoses     Screening for prostate cancer       Relevant Orders   PSA       Return in about 1 year (around 10/27/2023) for CPE and Labs.    Romilda Garret, NP

## 2022-10-27 NOTE — Assessment & Plan Note (Signed)
States he is to 6 to 7 hours of restful sleep on the CPAP.  He is interested in alternative therapies such as oral appliances or inspire.  Ambulatory referral to pulmonology

## 2022-10-27 NOTE — Patient Instructions (Signed)
Nice to see you today I will be in touch with the labs once I have them Follow up with me in 1 year for your next physical with labs, sooner if you need me   Think about getting the shingles vaccine

## 2022-10-27 NOTE — Assessment & Plan Note (Signed)
Patient is status post gastric sleeve.  Will check A1c today.  Continue healthy lifestyle modifications

## 2022-10-28 LAB — CBC
HCT: 41.6 % (ref 39.0–52.0)
Hemoglobin: 13.9 g/dL (ref 13.0–17.0)
MCHC: 33.5 g/dL (ref 30.0–36.0)
MCV: 80.6 fl (ref 78.0–100.0)
Platelets: 265 10*3/uL (ref 150.0–400.0)
RBC: 5.17 Mil/uL (ref 4.22–5.81)
RDW: 13 % (ref 11.5–15.5)
WBC: 6.5 10*3/uL (ref 4.0–10.5)

## 2022-10-28 LAB — VITAMIN B12: Vitamin B-12: 468 pg/mL (ref 211–911)

## 2022-10-28 LAB — HEMOGLOBIN A1C: Hgb A1c MFr Bld: 5.8 % (ref 4.6–6.5)

## 2022-10-28 LAB — LIPID PANEL
Cholesterol: 235 mg/dL — ABNORMAL HIGH (ref 0–200)
HDL: 47.9 mg/dL (ref >39.00)
NonHDL: 187.14
Total CHOL/HDL Ratio: 5
Triglycerides: 272 mg/dL — ABNORMAL HIGH (ref 0.0–149.0)
VLDL: 54.4 mg/dL — ABNORMAL HIGH (ref 0.0–40.0)

## 2022-10-28 LAB — COMPREHENSIVE METABOLIC PANEL
ALT: 22 U/L (ref 0–53)
AST: 21 U/L (ref 0–37)
Albumin: 4.3 g/dL (ref 3.5–5.2)
Alkaline Phosphatase: 40 U/L (ref 39–117)
BUN: 19 mg/dL (ref 6–23)
CO2: 25 mEq/L (ref 19–32)
Calcium: 9.8 mg/dL (ref 8.4–10.5)
Chloride: 104 mEq/L (ref 96–112)
Creatinine, Ser: 1.15 mg/dL (ref 0.40–1.50)
GFR: 73.74 mL/min (ref >60.00)
Glucose, Bld: 88 mg/dL (ref 70–99)
Potassium: 4 mEq/L (ref 3.5–5.1)
Sodium: 140 mEq/L (ref 135–145)
Total Bilirubin: 0.5 mg/dL (ref 0.2–1.2)
Total Protein: 6.9 g/dL (ref 6.0–8.3)

## 2022-10-28 LAB — TSH: TSH: 1.02 u[IU]/mL (ref 0.35–5.50)

## 2022-10-28 LAB — LDL CHOLESTEROL, DIRECT: Direct LDL: 146 mg/dL

## 2022-10-28 LAB — PSA: PSA: 3.48 ng/mL (ref 0.10–4.00)

## 2022-10-28 LAB — VITAMIN D 25 HYDROXY (VIT D DEFICIENCY, FRACTURES): VITD: 21.79 ng/mL — ABNORMAL LOW (ref 30.00–100.00)

## 2022-10-31 ENCOUNTER — Other Ambulatory Visit: Payer: Self-pay | Admitting: Nurse Practitioner

## 2022-10-31 DIAGNOSIS — E559 Vitamin D deficiency, unspecified: Secondary | ICD-10-CM

## 2022-10-31 MED ORDER — VITAMIN D (ERGOCALCIFEROL) 1.25 MG (50000 UNIT) PO CAPS: 50000.0000 [IU] | ORAL_CAPSULE | ORAL | 0 refills | Status: AC

## 2022-11-23 ENCOUNTER — Ambulatory Visit (INDEPENDENT_AMBULATORY_CARE_PROVIDER_SITE_OTHER): Payer: 59 | Admitting: Pulmonary Disease

## 2022-11-23 ENCOUNTER — Encounter (HOSPITAL_BASED_OUTPATIENT_CLINIC_OR_DEPARTMENT_OTHER): Payer: Self-pay | Admitting: Pulmonary Disease

## 2022-11-23 VITALS — BP 116/66 | HR 78 | Temp 98.3°F | Ht 71.0 in | Wt 245.0 lb

## 2022-11-23 DIAGNOSIS — G4733 Obstructive sleep apnea (adult) (pediatric): Secondary | ICD-10-CM | POA: Diagnosis not present

## 2022-11-23 NOTE — Assessment & Plan Note (Signed)
We reviewed his baseline study showing severe OSA.  He has lost at least 40 pounds since then.  Proceed with home sleep test to reassess severity.  From his symptomatology, it does seem that he still has residual OSA.  He is very diligent with his CPAP and this has certainly helped improve his daytime somnolence and fatigue. We discussed alternative treatment with hypoglossal nerve stimulator implant. He has had very good results with CPAP and has tolerated this well.  We discussed alternative mask such as AirFit F30 and he will trial overall we agreed that it would be best to continue on current CPAP therapy We will assess download and ensure that settings of 14 cm are adequate. Will also establish with a new DME  Weight loss encouraged, compliance with goal of at least 4-6 hrs every night is the expectation. Advised against medications with sedative side effects Cautioned against driving when sleepy - understanding that sleepiness will vary on a day to day basis

## 2022-11-23 NOTE — Patient Instructions (Addendum)
X Home sleep test to reasssess  X trial of airfit F30 full face mask   X set up new DME  X CPAP download

## 2022-11-23 NOTE — Progress Notes (Signed)
Subjective:    Patient ID: Christopher Burns, male    DOB: May 08, 1971, 52 y.o.   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 OSA was diagnosed in the 90s but he did not get on CPAP until 2018.  He is maintained on CPAP 14 cm with a fullface mask.  He underwent UPPP many years ago he does not have a DME. He underwent bariatric surgery in 2022 and lost about 85 pounds to his current weight of 245 he would like to explore alternative therapy/inspire for OSA. Epworth Sleepiness Scale score is 5 and he denies excessive sleepiness or fatigue. Bedtime is between 9 PM and midnight, sleep latency is 3 minutes, reports 1-2 nocturnal awakenings and is out of bed by 7 AM feeling rested without dryness of mouth or headaches. There is no history suggestive of cataplexy, sleep paralysis or parasomnias  PMH -right DVT/PE with RV strain 10/2020 Hypertension Asthma Hyperlipidemia  Significant tests/ events reviewed  PSG 05/08/17 - AHI 67, SpO8min 53%   Past Medical History:  Diagnosis Date   Asthma    Dizziness    Family history of BRCA1 gene positive    Family history of breast cancer    Family history of ovarian cancer    Family history of prostate cancer    GERD (gastroesophageal reflux disease)    Hemorrhoid    High blood pressure    High cholesterol    Hx of blood clots    Hypercholesteremia    Polycythemia    Pre-diabetes    Sleep apnea    cpap   Past Surgical History:  Procedure Laterality Date   bisep Left 2007   CHOLECYSTECTOMY N/A 04/26/2022   Procedure: LAPAROSCOPIC CHOLECYSTECTOMY WITH INTRAOPERATIVE CHOLANGIOGRAM;  Surgeon: Luretha Murphy, MD;  Location: [facility];  Service: General;  Laterality: N/A;   COLONOSCOPY WITH PROPOFOL N/A 03/01/2019   Procedure: COLONOSCOPY WITH PROPOFOL;  Surgeon: Wyline Mood, MD;  Location: [facility] ENDOSCOPY;  Service: Gastroenterology;  Laterality: N/A;   KNEE SURGERY Right    LASIK     PULMONARY THROMBECTOMY  Bilateral 10/23/2020   Procedure: PULMONARY THROMBECTOMY;  Surgeon: Annice Needy, MD;  Location: [facility] INVASIVE CV LAB;  Service: Cardiovascular;  Laterality: Bilateral;   TONSILLECTOMY     UMBILICAL HERNIA REPAIR N/A 07/29/2015   Procedure: HERNIA REPAIR UMBILICAL ADULT;  Surgeon: Kieth Brightly, MD;  Location: [facility] ORS;  Service: General;  Laterality: N/A;   UPPER GI ENDOSCOPY N/A 06/15/2021   Procedure: UPPER GI ENDOSCOPY;  Surgeon: Luretha Murphy, MD;  Location: [facility];  Service: General;  Laterality: N/A;   VASOTOMY      Allergies  Allergen Reactions   Lisinopril Swelling    Angioedema    Other Itching and Rash    Pt was tested and positive for feline, canine and cockroaches. Pt has rash, itching, watery eyes. And is on allergy injections since 2017    Social History   Socioeconomic History   Marital status: Married    Spouse name: [name]   Number of children: 1   Years of education: 13   Highest education level: Not on file  Occupational History   Occupation: work full time    Comment: [employer]  Tobacco Use   Smoking status: Never   Smokeless tobacco: Never  Vaping Use   Vaping Use: Never used  Substance and Sexual Activity   Alcohol use: Never   Drug use: Never   Sexual  activity: Yes    Birth control/protection: Surgical  Other Topics Concern   Not on file  Social History Narrative   Patient lives at home  with his wife [name]).  Patient works full time for [employer].   Education some college.   Right handed.   Caffeine one shot of colombian  coffee.      Danny (19)      Hobbies: working out, Oceanographer  (plays and Engineer, water)   Social Determinants of Corporate investment banker Strain: Not on file  Food Insecurity: Not on file  Transportation Needs: Not on file  Physical Activity: Not on file  Stress: Not on file  Social Connections: Not on file  Intimate Partner Violence: Not on file    Family History  Problem Relation Age  of Onset   High Cholesterol Mother    Breast cancer Mother 30   Diabetes Father    High blood pressure Father    High Cholesterol Father    Prostate cancer Paternal Grandfather    Cancer Maternal Grandfather        possible stomach cancer   Cancer Maternal Aunt        possible pancreatic cancer   Esophageal cancer Maternal Uncle    Breast cancer Cousin    Ovarian cancer Cousin      Review of Systems Constitutional: negative for anorexia, fevers and sweats  Eyes: negative for irritation, redness and visual disturbance  Ears, nose, mouth, throat, and face: negative for earaches, epistaxis, nasal congestion and sore throat  Respiratory: negative for cough, dyspnea on exertion, sputum and wheezing  Cardiovascular: negative for chest pain, dyspnea, lower extremity edema, orthopnea, palpitations and syncope  Gastrointestinal: negative for abdominal pain, constipation, diarrhea, melena, nausea and vomiting  Genitourinary:negative for dysuria, frequency and hematuria  Hematologic/lymphatic: negative for bleeding, easy bruising and lymphadenopathy  Musculoskeletal:negative for arthralgias, muscle weakness and stiff joints  Neurological: negative for coordination problems, gait problems, headaches and weakness  Endocrine: negative for diabetic symptoms including polydipsia, polyuria and weight loss     Objective:   Physical Exam  Gen. Pleasant, obese, in no distress, normal affect ENT - no pallor,icterus, no post nasal drip, class 2 airway, s/p UPPP Neck: No JVD, no thyromegaly, no carotid bruits Lungs: no use of accessory muscles, no dullness to percussion, decreased without rales or rhonchi  Cardiovascular: Rhythm regular, heart sounds  normal, no murmurs or gallops, no peripheral edema Abdomen: soft and non-tender, no hepatosplenomegaly, BS normal. Musculoskeletal: No deformities, no cyanosis or clubbing Neuro:  alert, non focal, no tremors       Assessment & Plan:

## 2023-01-16 ENCOUNTER — Encounter (INDEPENDENT_AMBULATORY_CARE_PROVIDER_SITE_OTHER): Payer: 59

## 2023-01-16 ENCOUNTER — Telehealth: Payer: Self-pay | Admitting: Pulmonary Disease

## 2023-01-16 DIAGNOSIS — G4733 Obstructive sleep apnea (adult) (pediatric): Secondary | ICD-10-CM | POA: Diagnosis not present

## 2023-01-16 NOTE — Telephone Encounter (Signed)
Recording time was low at only 3 hours but home sleep test did show moderate sleep apnea with residual AHI 16/hour. He should stay on CPAP. Please check with him that he has been able to establish with a new DME and obtain supplies. I also would like to obtain a download on his current machine

## 2023-01-18 NOTE — Telephone Encounter (Signed)
Called and spoke with patient. Patient verbalized understanding. Advised patient I would put the order in for cpap supplies.   Nothing further needed.

## 2023-01-19 ENCOUNTER — Ambulatory Visit (INDEPENDENT_AMBULATORY_CARE_PROVIDER_SITE_OTHER): Payer: 59 | Admitting: Podiatry

## 2023-01-19 ENCOUNTER — Encounter: Payer: Self-pay | Admitting: Podiatry

## 2023-01-19 DIAGNOSIS — M659 Synovitis and tenosynovitis, unspecified: Secondary | ICD-10-CM | POA: Diagnosis not present

## 2023-01-19 MED ORDER — TRIAMCINOLONE ACETONIDE 10 MG/ML IJ SUSP
10.0000 mg | Freq: Once | INTRAMUSCULAR | Status: AC
Start: 2023-01-19 — End: 2023-01-19
  Administered 2023-01-19: 10 mg

## 2023-01-19 NOTE — Progress Notes (Signed)
Subjective:   Patient ID: Christopher Burns, male   DOB: 52 y.o.   MRN: [id number]   HPI Patient presents with a lot of fluid around the fifth metatarsal right foot stated it just started to hurt in the last few weeks and also has a lesion in the area   ROS      Objective:  Physical Exam  Neurovascular status intact with patient found to have inflammation fluid in the fifth metatarsal head right with lesion that forms along with the pathology     Assessment:  Capsulitis of the right fifth MPJ plantar fluid buildup lesion formation     Plan:  H&P reviewed went ahead today recommended conservative treatment as it is done well for at least 6 months.'s and did sterile prep injected the plantar capsule 3 mg Dexasone Kenalog 5 mg Xylocaine debrided lesion reappoint as needed

## 2023-02-14 ENCOUNTER — Other Ambulatory Visit: Payer: Self-pay | Admitting: Nurse Practitioner

## 2023-02-14 NOTE — Telephone Encounter (Signed)
Prescription Request  02/14/2023  LOV: 10/27/2022  What is the name of the medication or equipment? simvastatin (ZOCOR) 10 MG tablet   Have you contacted your pharmacy to request a refill? Yes   Which pharmacy would you like this sent to?  [id number], [place] - [address] [place] [address] Phone: [phone] Fax: [phone]    Patient notified that their request is being sent to the clinical staff for review and that they should receive a response within 2 business days.   Please advise at Mobile [phone] (mobile)

## 2023-02-15 MED ORDER — SIMVASTATIN 10 MG PO TABS: ORAL_TABLET | ORAL | 1 refills | Status: DC

## 2023-03-08 ENCOUNTER — Encounter: Payer: Self-pay | Admitting: Podiatry

## 2023-03-08 ENCOUNTER — Ambulatory Visit: Payer: 59 | Admitting: Podiatry

## 2023-03-08 VITALS — BP 125/78 | HR 86 | Ht 71.0 in | Wt 245.0 lb

## 2023-03-08 DIAGNOSIS — M7751 Other enthesopathy of right foot: Secondary | ICD-10-CM

## 2023-03-08 DIAGNOSIS — Q828 Other specified congenital malformations of skin: Secondary | ICD-10-CM | POA: Diagnosis not present

## 2023-03-08 NOTE — Progress Notes (Signed)
  Subjective:  Patient ID: Christopher Burns, male    DOB: August 29, 1970,   MRN: [id number]  No chief complaint on file.   52 y.o. male presents for concern of possible infection from previous injection and callus removal. He has been seeing Dr. Charlsie Merles every few months for a couple years for a porokeratosis and inflamamtion around his fifth digit. Relates he was recently working in [place] at the [employer] and walking a lot and ended up getting  a lot of inflammation. He was seen by urgent care and given antibiotics and has improved since then.  . Denies any other pedal complaints. Denies n/v/f/c.   Past Medical History:  Diagnosis Date   Asthma    Dizziness    Family history of BRCA1 gene positive    Family history of breast cancer    Family history of ovarian cancer    Family history of prostate cancer    GERD (gastroesophageal reflux disease)    Hemorrhoid    High blood pressure    High cholesterol    Hx of blood clots    Hypercholesteremia    Polycythemia    Pre-diabetes    Sleep apnea    cpap    Objective:  Physical Exam: Vascular: DP/PT pulses 2/4 bilateral. CFT <3 seconds. Normal hair growth on digits. No edema.  Skin. No lacerations or abrasions bilateral feet. Small laceration noted laterally to fifth metatarsal head. Mild erythema noted Musculoskeletal: MMT 5/5 bilateral lower extremities in DF, PF, Inversion and Eversion. Deceased ROM in DF of ankle joint.  Tender to fifth metatarsal head and fifth MPJ plantarlly and laterally. Pain with ROM of the fifth digit.  Neurological: Sensation intact to light touch.   Assessment:   1. Capsulitis of metatarsophalangeal (MTP) joint of right foot   2. Porokeratosis      Plan:  Patient was evaluated and treated and all questions answered. Discussed capsulitis and inflammation of joint and treatment options with patient.  Radiographs reviewed and discussed with patient.  Advised to finish out antibtioics and keep neopsoirn on area.   Discussed given length of time dealing with lesion on bottom of foot could consider getting MRI for further evaluation of course of pain and possible surgical planning.  Padding provided.  Return after MRI   Louann Sjogren, DPM

## 2023-03-12 ENCOUNTER — Ambulatory Visit
Admission: RE | Admit: 2023-03-12 | Discharge: 2023-03-12 | Disposition: A | Discharge status: Home / Self Care | Payer: 59 | Source: Ambulatory Visit | Attending: Podiatry | Admitting: Podiatry

## 2023-03-12 DIAGNOSIS — M7751 Other enthesopathy of right foot: Secondary | ICD-10-CM

## 2023-03-12 DIAGNOSIS — Q828 Other specified congenital malformations of skin: Secondary | ICD-10-CM

## 2023-03-21 ENCOUNTER — Telehealth: Payer: Self-pay | Admitting: Podiatry

## 2023-03-21 NOTE — Telephone Encounter (Signed)
pt would like a call back for MRI results

## 2023-03-22 NOTE — Telephone Encounter (Signed)
Patient is coming in tomorrow. 

## 2023-03-23 ENCOUNTER — Ambulatory Visit: Payer: 59 | Admitting: Podiatry

## 2023-04-20 ENCOUNTER — Encounter: Payer: Self-pay | Admitting: Podiatry

## 2023-04-20 ENCOUNTER — Ambulatory Visit (INDEPENDENT_AMBULATORY_CARE_PROVIDER_SITE_OTHER): Payer: 59

## 2023-04-20 ENCOUNTER — Ambulatory Visit: Payer: 59 | Admitting: Podiatry

## 2023-04-20 DIAGNOSIS — D492 Neoplasm of unspecified behavior of bone, soft tissue, and skin: Secondary | ICD-10-CM | POA: Diagnosis not present

## 2023-04-20 DIAGNOSIS — M7751 Other enthesopathy of right foot: Secondary | ICD-10-CM

## 2023-04-20 DIAGNOSIS — M21621 Bunionette of right foot: Secondary | ICD-10-CM | POA: Diagnosis not present

## 2023-04-20 NOTE — Progress Notes (Signed)
Subjective:   Patient ID: Christopher Burns, male   DOB: 52 y.o.   MRN: [id number]   HPI Patient presents with a lot of pain outside of her right foot and states he needs to have this fixed that it has been intensely discomforting.  He has had 2 MRIs which were negative for signs of abscess infection or other pathology and it is very painful on the outside of the right fifth metatarsal with lesion formation   ROS      Objective:  Physical Exam  Laboratory capsulitis fifth metatarsal head right with tailor's bunion deformity and benign Leo plasm formation     Assessment:  Tailor's bunion deformity along with lesion very painful     Plan:  H&P discussed and did precautionary x-ray finding it to be normal.  He is opted for surgical intervention and I recommended fifth metatarsal head resection with wide excision of lesion and I explained procedure risk and allowed him to read consent form going over alternative treatments complications and he signed consent form.  Patient scheduled outpatient surgery I dispensed air fracture walker with all instructions on usage and properly fitted to his lower leg.  Patient will have this performed in the next few weeks and is scheduled for outpatient surgery  X-rays indicate fifth metatarsal head looks healthy no signs of pathology

## 2023-04-25 ENCOUNTER — Telehealth: Payer: Self-pay | Admitting: Podiatry

## 2023-04-25 NOTE — Telephone Encounter (Signed)
DOS-05/09/23  EXC BENIGN LESION [id number] METATARSAL HEAD RES 5TH [id number]  [facility] EFFECTIVE DATE- 08/15/2022   DEDUCTIBLE: $500 W/ REMAINING $498.15 OOP- $2,500 WITH REMAINING $1,952.38 COINSURANCE- 20%  PER [employer] [facility] FOR CPT CODES 08657 AND 28113, PRIOR AUTH WAS APPROVED, AUTH # [id number], GOOD FROM 9/24-12/23/2024

## 2023-04-26 ENCOUNTER — Ambulatory Visit: Payer: 59 | Admitting: Podiatry

## 2023-05-04 ENCOUNTER — Telehealth: Payer: Self-pay | Admitting: Pulmonary Disease

## 2023-05-04 DIAGNOSIS — G4733 Obstructive sleep apnea (adult) (pediatric): Secondary | ICD-10-CM

## 2023-05-04 NOTE — Telephone Encounter (Signed)
Patient is calling because he doesn't have products for his CPAP machine and for a new Cpap. He had his home sleep study back in June. He is unsure of which company he needs to use or appointment he needs to make. Please call and advise [phone]

## 2023-05-05 NOTE — Telephone Encounter (Signed)
Need a order to send

## 2023-05-08 MED ORDER — HYDROCODONE-ACETAMINOPHEN 10-325 MG PO TABS: 1.0000 | ORAL_TABLET | Freq: Three times a day (TID) | ORAL | 0 refills | Status: AC | PRN

## 2023-05-08 NOTE — Addendum Note (Signed)
Addended by: Lenn Sink on: 05/08/2023 01:45 PM   Modules accepted: Orders

## 2023-05-09 DIAGNOSIS — D2371 Other benign neoplasm of skin of right lower limb, including hip: Secondary | ICD-10-CM | POA: Diagnosis not present

## 2023-05-09 DIAGNOSIS — M2011 Hallux valgus (acquired), right foot: Secondary | ICD-10-CM | POA: Diagnosis not present

## 2023-05-10 ENCOUNTER — Ambulatory Visit: Payer: 59 | Admitting: Podiatry

## 2023-05-11 NOTE — Telephone Encounter (Signed)
DME order for supplies placed.

## 2023-05-15 ENCOUNTER — Encounter: Payer: Self-pay | Admitting: Podiatry

## 2023-05-15 ENCOUNTER — Ambulatory Visit (INDEPENDENT_AMBULATORY_CARE_PROVIDER_SITE_OTHER): Payer: 59

## 2023-05-15 ENCOUNTER — Ambulatory Visit (INDEPENDENT_AMBULATORY_CARE_PROVIDER_SITE_OTHER): Payer: 59 | Admitting: Podiatry

## 2023-05-15 VITALS — BP 126/73 | HR 74

## 2023-05-15 DIAGNOSIS — Z9889 Other specified postprocedural states: Secondary | ICD-10-CM

## 2023-05-15 NOTE — Progress Notes (Signed)
Subjective:   Patient ID: Christopher Burns, male   DOB: 52 y.o.   MRN: [id number]   HPI Patient states doing very well with surgery very pleased   ROS      Objective:  Physical Exam  Neurovascular status intact negative Denna Haggard' sign noted wound edges right fifth metatarsal and excision of neoplasm found to be well coapted no drainage     Assessment:  Doing well post metatarsal head resection and resection of lesion     Plan:  H&P x-rays reviewed and sterile dressing reapplied continue elevation compression immobilization dispensed surgical shoe reappoint 2 weeks suture removal  X-rays indicate satisfactory resection head of fifth metatarsal right

## 2023-05-29 ENCOUNTER — Encounter: Payer: Self-pay | Admitting: Podiatry

## 2023-05-29 ENCOUNTER — Ambulatory Visit (INDEPENDENT_AMBULATORY_CARE_PROVIDER_SITE_OTHER): Payer: 59

## 2023-05-29 ENCOUNTER — Ambulatory Visit (INDEPENDENT_AMBULATORY_CARE_PROVIDER_SITE_OTHER): Payer: 59 | Admitting: Podiatry

## 2023-05-29 DIAGNOSIS — M778 Other enthesopathies, not elsewhere classified: Secondary | ICD-10-CM | POA: Diagnosis not present

## 2023-05-31 NOTE — Progress Notes (Signed)
Subjective:   Patient ID: Christopher Burns, male   DOB: 52 y.o.   MRN: [id number]   HPI Patient states doing well with the right foot states that he is ready to get stitches out but overall feels good   ROS      Objective:  Physical Exam  Neuro vas scaler status intact negative Denna Haggard' sign noted right foot healing well wound edges well coapted stitches intact     Assessment:  Doing well post met head resection right removal of skin wedge     Plan:  X-ray reviewed stitches removed wound edges coapted well dressing applied return to normal shoe gear in the next week or 2 and hopefully activities within 4 weeks.  All questions answered reappoint to recheck  X-rays indicate satisfactory resection of bone good alignment noted no other pathology

## 2023-08-17 ENCOUNTER — Other Ambulatory Visit: Payer: Self-pay | Admitting: Nurse Practitioner

## 2023-08-17 NOTE — Telephone Encounter (Signed)
 Can we schedule the patient for a CPE per my last office note please

## 2023-08-17 NOTE — Telephone Encounter (Signed)
 LVM for patient to c/b and schedule.

## 2023-10-02 ENCOUNTER — Telehealth: Payer: Self-pay

## 2023-10-02 ENCOUNTER — Ambulatory Visit (INDEPENDENT_AMBULATORY_CARE_PROVIDER_SITE_OTHER): Payer: 59 | Admitting: Nurse Practitioner

## 2023-10-02 VITALS — BP 130/80 | HR 74 | Temp 98.2°F | Ht 71.0 in | Wt 250.0 lb

## 2023-10-02 DIAGNOSIS — I10 Essential (primary) hypertension: Secondary | ICD-10-CM | POA: Diagnosis not present

## 2023-10-02 DIAGNOSIS — G4733 Obstructive sleep apnea (adult) (pediatric): Secondary | ICD-10-CM | POA: Diagnosis not present

## 2023-10-02 DIAGNOSIS — E782 Mixed hyperlipidemia: Secondary | ICD-10-CM | POA: Diagnosis not present

## 2023-10-02 DIAGNOSIS — Z6834 Body mass index (BMI) 34.0-34.9, adult: Secondary | ICD-10-CM

## 2023-10-02 DIAGNOSIS — K219 Gastro-esophageal reflux disease without esophagitis: Secondary | ICD-10-CM | POA: Diagnosis not present

## 2023-10-02 DIAGNOSIS — Z903 Acquired absence of stomach [part of]: Secondary | ICD-10-CM

## 2023-10-02 DIAGNOSIS — E669 Obesity, unspecified: Secondary | ICD-10-CM | POA: Diagnosis not present

## 2023-10-02 DIAGNOSIS — Z125 Encounter for screening for malignant neoplasm of prostate: Secondary | ICD-10-CM

## 2023-10-02 DIAGNOSIS — Z23 Encounter for immunization: Secondary | ICD-10-CM

## 2023-10-02 LAB — COMPREHENSIVE METABOLIC PANEL
ALT: 26 U/L (ref 0–53)
AST: 22 U/L (ref 0–37)
Albumin: 4.6 g/dL (ref 3.5–5.2)
Alkaline Phosphatase: 46 U/L (ref 39–117)
BUN: 15 mg/dL (ref 6–23)
CO2: 30 meq/L (ref 19–32)
Calcium: 9.5 mg/dL (ref 8.4–10.5)
Chloride: 104 meq/L (ref 96–112)
Creatinine, Ser: 0.94 mg/dL (ref 0.40–1.50)
GFR: 93.32 mL/min (ref >60.00)
Glucose, Bld: 77 mg/dL (ref 70–99)
Potassium: 4.2 meq/L (ref 3.5–5.1)
Sodium: 141 meq/L (ref 135–145)
Total Bilirubin: 0.5 mg/dL (ref 0.2–1.2)
Total Protein: 6.9 g/dL (ref 6.0–8.3)

## 2023-10-02 LAB — HEMOGLOBIN A1C: Hgb A1c MFr Bld: 5.9 % (ref 4.6–6.5)

## 2023-10-02 LAB — TSH: TSH: 1.65 u[IU]/mL (ref 0.35–5.50)

## 2023-10-02 LAB — CBC
HCT: 43.5 % (ref 39.0–52.0)
Hemoglobin: 14.3 g/dL (ref 13.0–17.0)
MCHC: 32.8 g/dL (ref 30.0–36.0)
MCV: 82 fL (ref 78.0–100.0)
Platelets: 270 10*3/uL (ref 150.0–400.0)
RBC: 5.3 Mil/uL (ref 4.22–5.81)
RDW: 12.7 % (ref 11.5–15.5)
WBC: 6.1 10*3/uL (ref 4.0–10.5)

## 2023-10-02 LAB — VITAMIN B12: Vitamin B-12: 985 pg/mL — ABNORMAL HIGH (ref 211–911)

## 2023-10-02 LAB — LIPID PANEL
Cholesterol: 218 mg/dL — ABNORMAL HIGH (ref 0–200)
HDL: 44.4 mg/dL (ref >39.00)
LDL Cholesterol: 112 mg/dL — ABNORMAL HIGH (ref 0–99)
NonHDL: 173.82
Total CHOL/HDL Ratio: 5
Triglycerides: 311 mg/dL — ABNORMAL HIGH (ref 0.0–149.0)
VLDL: 62.2 mg/dL — ABNORMAL HIGH (ref 0.0–40.0)

## 2023-10-02 LAB — VITAMIN D 25 HYDROXY (VIT D DEFICIENCY, FRACTURES): VITD: 33.82 ng/mL (ref 30.00–100.00)

## 2023-10-02 LAB — PSA: PSA: 3.9 ng/mL (ref 0.10–4.00)

## 2023-10-02 MED ORDER — WEGOVY 0.25 MG/0.5ML ~~LOC~~ SOAJ: 0.2500 mg | SUBCUTANEOUS | 0 refills | Status: DC

## 2023-10-02 NOTE — Telephone Encounter (Signed)
Copied from CRM [id number]. Topic: General - Other >> Oct 02, 2023 11:39 AM Truddie Crumble wrote: Reason for CRM: patient called stating his insurance does cover wegovy and [facility]

## 2023-10-02 NOTE — Addendum Note (Signed)
Addended by: Eden Emms on: 10/02/2023 01:40 PM   Modules accepted: Orders

## 2023-10-02 NOTE — Assessment & Plan Note (Signed)
Patient is followed by pulmonology currently on CPAP.  Patient is adherent to therapy and tolerates it well.  Continue

## 2023-10-02 NOTE — Assessment & Plan Note (Signed)
Pending CBC, B12, vitamin D

## 2023-10-02 NOTE — Progress Notes (Signed)
Established Patient Office Visit  Subjective   Patient ID: Christopher Burns, male    DOB: 1970/11/22  Age: 53 y.o. MRN: [id number]  Chief Complaint  Patient presents with   Weight Loss    Pt complains of having weight loss surgery 06/2022. Pt states that he has gained weight since and would like to discuss possible weight loss medications. Prefers injectables.    Immunizations    Pt would like shingles and flu vaccine.     HPI     HTN: on amlodipine 5 mg daily.  Tolerates medication well.  OSA: currently on the CPAP. States that he is having trouble with the tubes. Sees pulmonary  Obsiety: states that he has a sleeve gastrectomy. He has been working out. He states that carbs and sugars are his weakness. States that he is creeping back up on the weight He is exercising 4 times a week at a time he will do weigths and cardio  Immunizations: -Tetanus: Completed in 2018 -Influenza: update today  -Shingles: update today  -Pneumonia: Completed    Colonoscopy: Completed in 02/13/2019, repeat in 10 years Lung Cancer Screening: N/A  PSA: Due      Review of Systems  Constitutional:  Negative for chills and fever.  Respiratory:  Negative for shortness of breath.   Cardiovascular:  Negative for chest pain.  Neurological:  Negative for headaches.  Psychiatric/Behavioral:  Negative for hallucinations and suicidal ideas.       Objective:     BP 130/80   Pulse 74   Temp 98.2 F (36.8 C) (Oral)   Ht 5\' 11"  (1.803 m)   Wt 250 lb (113.4 kg)   SpO2 97%   BMI 34.87 kg/m  BP Readings from Last 3 Encounters:  10/02/23 130/80  05/15/23 126/73  03/08/23 125/78   Wt Readings from Last 3 Encounters:  10/02/23 250 lb (113.4 kg)  03/08/23 245 lb (111.1 kg)  11/23/22 245 lb (111.1 kg)   SpO2 Readings from Last 3 Encounters:  10/02/23 97%  03/08/23 93%  11/23/22 97%      Physical Exam Vitals and nursing note reviewed.  Constitutional:      Appearance: Normal  appearance.  HENT:     Right Ear: Tympanic membrane, ear canal and external ear normal.     Left Ear: Tympanic membrane, ear canal and external ear normal.     Mouth/Throat:     Mouth: Mucous membranes are moist.     Pharynx: Oropharynx is clear.  Eyes:     Extraocular Movements: Extraocular movements intact.     Pupils: Pupils are equal, round, and reactive to light.  Cardiovascular:     Rate and Rhythm: Normal rate and regular rhythm.     Pulses: Normal pulses.     Heart sounds: Normal heart sounds.  Pulmonary:     Effort: Pulmonary effort is normal.     Breath sounds: Normal breath sounds.  Abdominal:     General: Bowel sounds are normal. There is no distension.     Palpations: There is no mass.     Tenderness: There is no abdominal tenderness.     Hernia: No hernia is present.  Musculoskeletal:     Right lower leg: No edema.     Left lower leg: No edema.  Lymphadenopathy:     Cervical: No cervical adenopathy.  Skin:    General: Skin is warm.  Neurological:     General: No focal deficit present.  Mental Status: He is alert.     Deep Tendon Reflexes:     Reflex Scores:      Bicep reflexes are 2+ on the right side and 2+ on the left side.      Patellar reflexes are 2+ on the right side and 2+ on the left side.    Comments: Bilateral upper and lower extremity strength 5/5  Psychiatric:        Mood and Affect: Mood normal.        Behavior: Behavior normal.        Thought Content: Thought content normal.        Judgment: Judgment normal.      No results found for any visits on 10/02/23.    The 10-year ASCVD risk score (Arnett DK, et al., 2019) is: 6.5%    Assessment & Plan:   Problem List Items Addressed This Visit       Cardiovascular and Mediastinum   Hypertension - Primary   Patient currently maintained on amlodipine 5 mg daily.  Blood pressure under good control.  Continue working healthy lifestyle modifications.  Continue medication as prescribed.       Relevant Orders   CBC   Comprehensive metabolic panel   Hemoglobin A1c   TSH   Lipid panel     Respiratory   OSA on CPAP   Patient is followed by pulmonology currently on CPAP.  Patient is adherent to therapy and tolerates it well.  Continue        Digestive   Acid reflux   Patient will take over-the-counter Nexium.  Continue        Other   Mixed hyperlipidemia   Patient currently maintained on simvastatin 10 mg daily.  Continue medication as prescribed.  Pending lipid panel today      History of sleeve gastrectomy   Pending CBC, B12, vitamin D      Relevant Orders   Vitamin B12   VITAMIN D 25 Hydroxy (Vit-D Deficiency, Fractures)   Obesity (BMI 30-39.9)   History of a sleeve gastrectomy with increasing weight.  Pending TSH, A1c, lipid panel.  Patient will check with insurance to see if one of the GLP-1 receptor agonist medications for weight loss is covered      Relevant Orders   TSH   Other Visit Diagnoses       Need for shingles vaccine       Relevant Orders   Zoster Recombinant (Shingrix ) (Completed)     Screening for prostate cancer       Relevant Orders   PSA     Need for influenza vaccination       Relevant Orders   Flu vaccine trivalent PF, 6mos and older(Flulaval,Afluria,Fluarix,Fluzone) (Completed)       Return in about 3 months (around 12/30/2023) for weight recheck/second shingles vaccine .    Audria Nine, NP

## 2023-10-02 NOTE — Assessment & Plan Note (Signed)
Patient currently maintained on simvastatin 10 mg daily.  Continue medication as prescribed.  Pending lipid panel today

## 2023-10-02 NOTE — Assessment & Plan Note (Signed)
History of a sleeve gastrectomy with increasing weight.  Pending TSH, A1c, lipid panel.  Patient will check with insurance to see if one of the GLP-1 receptor agonist medications for weight loss is covered

## 2023-10-02 NOTE — Assessment & Plan Note (Signed)
Patient will take over-the-counter Nexium.  Continue

## 2023-10-02 NOTE — Assessment & Plan Note (Signed)
Patient currently maintained on amlodipine 5 mg daily.  Blood pressure under good control.  Continue working healthy lifestyle modifications.  Continue medication as prescribed.

## 2023-10-02 NOTE — Patient Instructions (Signed)
Nice to see you today I will be in touch with the labs once I have them Zepbound and Wegovy are the injectable weight loss medications  Follow up in 3 months for weight recheck and second shingles vaccine

## 2023-10-03 ENCOUNTER — Other Ambulatory Visit (HOSPITAL_COMMUNITY): Payer: Self-pay

## 2023-10-03 ENCOUNTER — Telehealth: Payer: Self-pay | Admitting: Pharmacy Technician

## 2023-10-03 NOTE — Telephone Encounter (Signed)
Pharmacy Patient Advocate Encounter  Received notification from [facility] that Prior Authorization for Wegovy 0.25MG /0.5ML auto-injectors  has been APPROVED from 10/03/2023 to 01/31/2024. Ran test claim, Copay is $24.99. This test claim was processed through [facility]- copay amounts may vary at other pharmacies due to pharmacy/plan contracts, or as the patient moves through the different stages of their insurance plan.   PA #/Case ID/Reference #: [id number]

## 2023-10-03 NOTE — Telephone Encounter (Signed)
Pharmacy Patient Advocate Encounter   Received notification from Fax that prior authorization for [facility] 0.25MG /0.5ML auto-injectors is required/requested.   Insurance verification completed.   The patient is insured through [employer] .   Per test claim: PA required; PA submitted to above mentioned insurance via CoverMyMeds Key/confirmation #/EOC [id number] Status is pending

## 2023-10-06 ENCOUNTER — Encounter: Payer: Self-pay | Admitting: Nurse Practitioner

## 2023-10-09 ENCOUNTER — Ambulatory Visit (INDEPENDENT_AMBULATORY_CARE_PROVIDER_SITE_OTHER): Payer: 59 | Admitting: Internal Medicine

## 2023-10-09 VITALS — BP 128/76 | HR 87 | Resp 16 | Ht 71.0 in | Wt 250.0 lb

## 2023-10-09 DIAGNOSIS — Z7189 Other specified counseling: Secondary | ICD-10-CM | POA: Insufficient documentation

## 2023-10-09 DIAGNOSIS — G4733 Obstructive sleep apnea (adult) (pediatric): Secondary | ICD-10-CM | POA: Diagnosis not present

## 2023-10-09 NOTE — Patient Instructions (Signed)

## 2023-10-09 NOTE — Progress Notes (Signed)
 Sleep Medicine   Office Visit  Patient Name: Christopher Burns DOB: [birth year] MRN [id number]    Chief Complaint: re-establish care for OSA  Brief History:  Christopher Burns presents for a follow up visit to re-establish care. The patient has a 7 year history of sleep apnea and is currently on a CPAP. Prior to using a PAP, sleep quality was poor. This was noted most nights. The patient reported the following symptoms:  snoring, fatigue, some daytime sleepiness, some brain fog, and headaches . The patient goes to sleep at 1100 pm and wakes up at 0630 am. The patient no history of psychiatric problems. The Epworth Sleepiness Score is 6 out of 24 . The patient relates  Cardiovascular risk factors include: hypertension. The patient is currently on a CPAP@ 14 cmH2O. The patient reports using his PAP and feels rested after sleeping with PAP.  The patient reports benefiting from PAP use and would like for him to continue using PAP. Reported sleepiness is  improved. The compliance download shows 99% compliance with an average use time of 7 hours 6  minutes. The AHI is 2.6.  The patient continues to require PAP therapy as a medical necessity in order to eliminate his sleep apnea.     ROS  General: (-) fever, (-) chills, (-) night sweat Nose and Sinuses: (-) nasal stuffiness or itchiness, (-) postnasal drip, (-) nosebleeds, (-) sinus trouble. Mouth and Throat: (-) sore throat, (-) hoarseness. Neck: (-) swollen glands, (-) enlarged thyroid, (-) neck pain. Respiratory: - cough, - shortness of breath, - wheezing. Neurologic: - numbness, - tingling. Psychiatric: - anxiety, - depression Sleep behavior: -sleep paralysis -hypnogogic hallucinations -dream enactment      -vivid dreams -cataplexy -night terrors -sleep walking   Current Medication: Outpatient Encounter Medications as of 10/09/2023  Medication Sig   acetaminophen (TYLENOL) 325 MG tablet Take 650 mg by mouth every 6 (six) hours as needed for moderate  pain.   acidophilus (RISAQUAD) CAPS capsule Take 1 capsule by mouth 3 (three) times daily. Okay to substitute any probiotic (Patient not taking: Reported on 10/02/2023)   albuterol (VENTOLIN HFA) 108 (90 Base) MCG/ACT inhaler Inhale 2 puffs into the lungs every 4 (four) hours as needed for wheezing or shortness of breath.   amLODipine (NORVASC) 5 MG tablet Take 1 tablet (5 mg total) by mouth daily.   Calcium Carb-Cholecalciferol (CALCIUM 500 + D PO) Take 1 tablet by mouth 3 (three) times daily.   cetirizine (ZYRTEC) 10 MG tablet Take 10 mg by mouth daily.   EPINEPHrine 0.3 mg/0.3 mL IJ SOAJ injection Inject 0.3 mg into the muscle as needed for anaphylaxis.   esomeprazole (NEXIUM) 20 MG capsule Take 20 mg by mouth daily at 12 noon.   HYDROcodone-acetaminophen (NORCO/VICODIN) 5-325 MG tablet Take 1 tablet by mouth every 4 (four) hours as needed. (Patient not taking: Reported on 10/02/2023)   hydrOXYzine (VISTARIL) 25 MG capsule Take 1 capsule (25 mg total) by mouth every 8 (eight) hours as needed. (Patient not taking: Reported on 10/02/2023)   montelukast (SINGULAIR) 10 MG tablet Take 10 mg by mouth at bedtime.    Multiple Vitamins-Minerals (MULTIVITAMIN WITH MINERALS) tablet Take 1 tablet by mouth daily.   NON FORMULARY Pt uses a cpap nightly   Omega-3 Fatty Acids (FISH OIL PO) Take 1 capsule by mouth daily.   Semaglutide-Weight Management (WEGOVY) 0.25 MG/0.5ML SOAJ Inject 0.25 mg into the skin once a week.   simvastatin (ZOCOR) 10 MG tablet TAKE 1 TABLET BY MOUTH  EVERYDAY AT BEDTIME   Vitamin D, Ergocalciferol, (DRISDOL) 1.25 MG (50000 UNIT) CAPS capsule Take 1 capsule (50,000 Units total) by mouth every 7 (seven) days. (Patient not taking: Reported on 10/02/2023)   No facility-administered encounter medications on file as of 10/09/2023.    Surgical History: Past Surgical History:  Procedure Laterality Date   bisep Left 2007   CHOLECYSTECTOMY N/A 04/26/2022   Procedure: LAPAROSCOPIC  CHOLECYSTECTOMY WITH INTRAOPERATIVE CHOLANGIOGRAM;  Surgeon: Luretha Murphy, MD;  Location: [facility]S;  Service: General;  Laterality: N/A;   COLONOSCOPY WITH PROPOFOL N/A 03/01/2019   Procedure: COLONOSCOPY WITH PROPOFOL;  Surgeon: Wyline Mood, MD;  Location: [facility] ENDOSCOPY;  Service: Gastroenterology;  Laterality: N/A;   KNEE SURGERY Right    LASIK     PULMONARY THROMBECTOMY Bilateral 10/23/2020   Procedure: PULMONARY THROMBECTOMY;  Surgeon: Annice Needy, MD;  Location: [facility] INVASIVE CV LAB;  Service: Cardiovascular;  Laterality: Bilateral;   TONSILLECTOMY     UMBILICAL HERNIA REPAIR N/A 07/29/2015   Procedure: HERNIA REPAIR UMBILICAL ADULT;  Surgeon: Kieth Brightly, MD;  Location: [facility] ORS;  Service: General;  Laterality: N/A;   UPPER GI ENDOSCOPY N/A 06/15/2021   Procedure: UPPER GI ENDOSCOPY;  Surgeon: Luretha Murphy, MD;  Location: [facility] ORS;  Service: General;  Laterality: N/A;   VASOTOMY      Medical History: Past Medical History:  Diagnosis Date   Asthma    Dizziness    Family history of BRCA1 gene positive    Family history of breast cancer    Family history of ovarian cancer    Family history of prostate cancer    GERD (gastroesophageal reflux disease)    Hemorrhoid    High blood pressure    High cholesterol    Hx of blood clots    Hypercholesteremia    Polycythemia    Pre-diabetes    Sleep apnea    cpap    Family History: Non contributory to the present illness  Social History: Social History   Socioeconomic History   Marital status: Married    Spouse name: Christopher Burns   Number of children: 1   Years of education: 13   Highest education level: Not on file  Occupational History   Occupation: work full time    Comment: [employer]  Tobacco Use   Smoking status: Never   Smokeless tobacco: Never  Vaping Use   Vaping status: Never Used  Substance and Sexual Activity   Alcohol use: Never   Drug use: Never   Sexual activity: Yes    Birth  control/protection: Surgical  Other Topics Concern   Not on file  Social History Narrative   Patient lives at home  with his wife Christopher Burns).  Patient works full time for [employer].   Education some college.   Right handed.   Caffeine one shot of colombian  coffee.      Danny (19)      Hobbies: working out, Oceanographer  (plays and Engineer, water)   Social Drivers of Corporate investment banker Strain: Not on file  Food Insecurity: Not on file  Transportation Needs: Not on file  Physical Activity: Not on file  Stress: Not on file  Social Connections: Not on file  Intimate Partner Violence: Not on file    Vital Signs: Blood pressure 128/76, pulse 87, resp. rate 16, height 5\' 11"  (1.803 m), weight 250 lb (113.4 kg), SpO2 96%. Body mass index is 34.87 kg/m.   Examination: General  Appearance: The patient is well-developed, well-nourished, and in no distress. Neck Circumference: 50 cm Skin: Gross inspection of skin unremarkable. Head: normocephalic, no gross deformities. Eyes: no gross deformities noted. ENT: ears appear grossly normal Neurologic: Alert and oriented. No involuntary movements.    STOP BANG RISK ASSESSMENT S (snore) Have you been told that you snore?     NO   T (tired) Are you often tired, fatigued, or sleepy during the day?   NO  O (obstruction) Do you stop breathing, choke, or gasp during sleep? NO   P (pressure) Do you have or are you being treated for high blood pressure? YES   B (BMI) Is your body index greater than 35 kg/m? NO   A (age) Are you 66 years old or older? YES   N (neck) Do you have a neck circumference greater than 16 inches?   YES   G (gender) Are you a male? YES   TOTAL STOP/BANG "YES" ANSWERS 4                                                               A STOP-Bang score of 2 or less is considered low risk, and a score of 5 or more is high risk for having either moderate or severe OSA. For people who score 3 or 4, doctors  may need to perform further assessment to determine how likely they are to have OSA.         EPWORTH SLEEPINESS SCALE:  Scale:  (0)= no chance of dozing; (1)= slight chance of dozing; (2)= moderate chance of dozing; (3)= high chance of dozing  Chance  Situtation    Sitting and reading: 0    Watching TV: 3    Sitting Inactive in public: 0    As a passenger in car: 3      Lying down to rest: 0    Sitting and talking: 0    Sitting quielty after lunch: 0    In a car, stopped in traffic: 0   TOTAL SCORE:   6 out of 24    SLEEP STUDIES:  PSG (04/2017) AHI 67/hr, min SpO2 53% Titration (05/2017) CPAP@ 14 cmH2O w/ C-Flex 3   CPAP COMPLIANCE DATA:  Date Range: 10/10/2022-10/09/2023  Average Daily Use: 7 hours 6 minutes  Median Use: 7 hours 4 minutes  Compliance for > 4 Hours: 99 %  AHI: 2.6 respiratory events per hour  Days Used: 364/365 days  Mask Leak: 9.1  95th Percentile Pressure: 14   LABS: Recent Results (from the past 2160 hours)  CBC     Status: None   Collection Time: 10/02/23 11:27 AM  Result Value Ref Range   WBC 6.1 4.0 - 10.5 K/uL   RBC 5.30 4.22 - 5.81 Mil/uL   Platelets 270.0 150.0 - 400.0 K/uL   Hemoglobin 14.3 13.0 - 17.0 g/dL   HCT 16.1 09.6 - 04.5 %   MCV 82.0 78.0 - 100.0 fl   MCHC 32.8 30.0 - 36.0 g/dL   RDW 40.9 81.1 - 91.4 %  Comprehensive metabolic panel     Status: None   Collection Time: 10/02/23 11:27 AM  Result Value Ref Range   Sodium 141 135 - 145 mEq/L   Potassium 4.2 3.5 - 5.1  mEq/L   Chloride 104 96 - 112 mEq/L   CO2 30 19 - 32 mEq/L   Glucose, Bld 77 70 - 99 mg/dL   BUN 15 6 - 23 mg/dL   Creatinine, Ser 4.09 0.40 - 1.50 mg/dL   Total Bilirubin 0.5 0.2 - 1.2 mg/dL   Alkaline Phosphatase 46 39 - 117 U/L   AST 22 0 - 37 U/L   ALT 26 0 - 53 U/L   Total Protein 6.9 6.0 - 8.3 g/dL   Albumin 4.6 3.5 - 5.2 g/dL   GFR 81.19 >14.78 mL/min    Comment: Calculated using the CKD-EPI Creatinine Equation (2021)   Calcium  9.5 8.4 - 10.5 mg/dL  Hemoglobin G9F     Status: None   Collection Time: 10/02/23 11:27 AM  Result Value Ref Range   Hgb A1c MFr Bld 5.9 4.6 - 6.5 %    Comment: Glycemic Control Guidelines for People with Diabetes:Non Diabetic:  <6%Goal of Therapy: <7%Additional Action Suggested:  >8%   TSH     Status: None   Collection Time: 10/02/23 11:27 AM  Result Value Ref Range   TSH 1.65 0.35 - 5.50 uIU/mL  PSA     Status: None   Collection Time: 10/02/23 11:27 AM  Result Value Ref Range   PSA 3.90 0.10 - 4.00 ng/mL    Comment: Test performed using Access Hybritech PSA Assay, a parmagnetic partical, chemiluminecent immunoassay.  Lipid panel     Status: Abnormal   Collection Time: 10/02/23 11:27 AM  Result Value Ref Range   Cholesterol 218 (H) 0 - 200 mg/dL    Comment: ATP III Classification       Desirable:  < 200 mg/dL               Borderline High:  200 - 239 mg/dL          High:  > = 621 mg/dL   Triglycerides 308.6 (H) 0.0 - 149.0 mg/dL    Comment: Normal:  <578 mg/dLBorderline High:  150 - 199 mg/dL   HDL 46.96 >29.52 mg/dL   VLDL 84.1 (H) 0.0 - 32.4 mg/dL   LDL Cholesterol 401 (H) 0 - 99 mg/dL   Total CHOL/HDL Ratio 5     Comment:                Men          Women1/2 Average Risk     3.4          3.3Average Risk          5.0          4.42X Average Risk          9.6          7.13X Average Risk          15.0          11.0                       NonHDL 173.82     Comment: NOTE:  Non-HDL goal should be 30 mg/dL higher than patient's LDL goal (i.e. LDL goal of < 70 mg/dL, would have non-HDL goal of < 100 mg/dL)  Vitamin U27     Status: Abnormal   Collection Time: 10/02/23 11:27 AM  Result Value Ref Range   Vitamin B-12 985 (H) 211 - 911 pg/mL  VITAMIN D 25 Hydroxy (Vit-D Deficiency, Fractures)     Status: None  Collection Time: 10/02/23 11:27 AM  Result Value Ref Range   VITD 33.82 30.00 - 100.00 ng/mL    Radiology: MR FOOT RIGHT WO CONTRAST Result Date: 03/22/2023 CLINICAL DATA:  Right  forefoot pain.  No known injury. EXAM: MRI OF THE RIGHT FOREFOOT WITHOUT CONTRAST TECHNIQUE: Multiplanar, multisequence MR imaging of the right forefoot was performed. No intravenous contrast was administered. COMPARISON:  Plain films right foot 01/10/2018. FINDINGS: Bones/Joint/Cartilage No fracture, stress change or worrisome lesion is identified. The patient has mild to moderate first MTP osteoarthritis with a subchondral cyst seen in the plantar aspect of the first metatarsal head overlying the lateral sesamoid. Mild edema is also present in the medial sesamoid. Bipartite medial sesamoid is noted. Also seen is mild degenerative change the articulation of the navicular and medial cuneiform. Ligaments Intact. Muscles and Tendons Intact and normal in appearance. Soft tissues There is a small volume of fluid in the second and third intermetatarsal spaces suggestive of bursitis. No mass. IMPRESSION: 1. 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 2. Mild second and third intermetatarsal bursitis. 3. Mild degenerative change at the articulation of the navicular and medial cuneiform. Electronically Signed   By: Drusilla Kanner M.D.   On: 03/22/2023 11:33    No results found.  No results found.    Assessment and Plan: Patient Active Problem List   Diagnosis Date Noted   CPAP use counseling 10/09/2023   Mild intermittent asthma 03/30/2022   Allergic rhinitis 03/30/2022   Facial cellulitis 12/21/2021   Swelling of upper lip 12/19/2021   Allergic reaction 12/16/2021   Obesity (BMI 30-39.9) 12/16/2021   History of sleeve gastrectomy 06/15/2021   LBBB (left bundle branch block)    OSA on CPAP 05/31/2021   Preventative health care 05/31/2021   Morbid obesity (HCC) 05/31/2021   BRCA1 gene mutation positive 01/12/2021   Genetic testing 01/12/2021   Family history of BRCA1 gene positive    Family history of breast cancer    Family history of ovarian cancer    Family history of prostate cancer     Prediabetes 11/19/2020   Flatulence, eructation and gas pain 11/03/2020   Acid reflux 11/03/2020   Hiccoughs 11/03/2020   Proctalgia 11/03/2020   Thrombosed external hemorrhoids 11/03/2020   Hospital discharge follow-up 10/30/2020   Long term current use of anticoagulant 10/30/2020   Chest pain due to myocardial ischemia 10/22/2020   Pulmonary embolism (HCC) 10/22/2020   Hypogonadism in male 10/22/2020   Mixed hyperlipidemia 05/21/2020   Hypertension 05/21/2020   Morbid obesity with alveolar hypoventilation (HCC) 05/21/2020   Polycythemia 07/29/2019   High cholesterol    Dizziness    1. OSA on CPAP (Primary) PLAN OSA:   Patient uses and tolerates his cpap well. However, some nights his apnea is not optimally controlled. He has a fixed pressure only machine, and his AHI is averaging 2.6 over the year so we will leave him at his current pressure for now. He needs a new machine as this one is past end of life, out of warranty and obsolete for repair and needs replacement. When he is seen in follow up if AHI variation persists we could put him on APAP. CPAP continues to be medically necessary to control this patient's OSA.  Recommend: CPAP replacement, f/u after setup  2. CPAP use counseling CPAP Counseling: had a lengthy discussion with the patient regarding the importance of PAP therapy in management of the sleep apnea. Patient appears to understand the risk factor reduction and  also understands the risks associated with untreated sleep apnea. Patient will try to make a good faith effort to remain compliant with therapy. Also instructed the patient on proper cleaning of the device including the water must be changed daily if possible and use of distilled water is preferred. Patient understands that the machine should be regularly cleaned with appropriate recommended cleaning solutions that do not damage the PAP machine for example given white vinegar and water rinses. Other methods such as  ozone treatment may not be as good as these simple methods to achieve cleaning.     General Counseling: I have discussed the findings of the evaluation and examination with Remi Deter.  I have also discussed any further diagnostic evaluation thatmay be needed or ordered today. Livio verbalizes understanding of the findings of todays visit. We also reviewed his medications today and discussed drug interactions and side effects including but not limited excessive drowsiness and altered mental states. We also discussed that there is always a risk not just to him but also people around him. he has been encouraged to call the office with any questions or concerns that should arise related to todays visit.  No orders of the defined types were placed in this encounter.       I have personally obtained a history, evaluated the patient, evaluated pertinent data, formulated the assessment and plan and placed orders.   This patient was seen today by Emmaline Kluver, PA-C in collaboration with Dr. Freda Munro.   Yevonne Pax, MD [facility] Diplomate ABMS Pulmonary and Critical Care Medicine Sleep medicine

## 2023-10-11 ENCOUNTER — Telehealth: Payer: Self-pay

## 2023-10-11 NOTE — Telephone Encounter (Signed)
 Copied from CRM [id number]. Topic: General - Call Back - No Documentation >> Oct 11, 2023  9:25 AM Christopher Burns wrote: Reason for CRM: Patient states he received a call from the office & was curious to know who was calling. Checked chart & there is no documentation from today, 10/11/23. Patient asks that someone give him a call. CB #: [id number].

## 2023-10-16 NOTE — Telephone Encounter (Signed)
 Contacted pt. States the call was regarding wegovy. Pt states he has no concerns or questions. States its been resolved.

## 2023-10-20 ENCOUNTER — Encounter: Payer: 59 | Admitting: Nurse Practitioner

## 2023-10-27 ENCOUNTER — Other Ambulatory Visit: Payer: Self-pay | Admitting: Nurse Practitioner

## 2023-10-27 DIAGNOSIS — E669 Obesity, unspecified: Secondary | ICD-10-CM

## 2023-10-30 NOTE — Telephone Encounter (Signed)
 Can we see if the patient is ready for the next dosage

## 2023-11-01 ENCOUNTER — Other Ambulatory Visit: Payer: Self-pay | Admitting: Nurse Practitioner

## 2023-11-01 ENCOUNTER — Encounter: Payer: Self-pay | Admitting: Nurse Practitioner

## 2023-11-01 DIAGNOSIS — E669 Obesity, unspecified: Secondary | ICD-10-CM

## 2023-11-01 NOTE — Telephone Encounter (Signed)
 Copied from CRM [id number]. Topic: Clinical - Medication Refill >> Nov 01, 2023  8:54 AM Shelbie Proctor wrote: Most Recent Primary Care Visit:  Provider: Eden Emms  Department: [facility]  Visit Type: PHYSICAL  Date: 10/02/2023  Medication: Semaglutide-Weight Management (WEGOVY) 0.25 MG/0.5ML SOAJ   Has the patient contacted their pharmacy? Yes, the pharmacy has tried to contact the office few times with no reply, advised patient to contact the office.  (Agent: If no, request that the patient contact the pharmacy for the refill. If patient does not wish to contact the pharmacy document the reason why and proceed with request.) (Agent: If yes, when and what did the pharmacy advise?)  Is this the correct pharmacy for this prescription? Yes If no, delete pharmacy and type the correct one.  This is the patient's preferred pharmacy:  [phone] [facility], [place] - [address] [place] [address] Phone: [phone] Fax: [phone]   Has the prescription been filled recently? No  Is the patient out of the medication? Yes. Patient is due to take medication tomorrow every Thursday. Patient has been trying to get a refill for about a week now.   Has the patient been seen for an appointment in the last year OR does the patient have an upcoming appointment? Yes  Can we respond through MyChart? No, please call [phone]  Agent: Please be advised that Rx refills may take up to 3 business days. We ask that you follow-up with your pharmacy.

## 2023-11-02 MED ORDER — WEGOVY 0.5 MG/0.5ML ~~LOC~~ SOAJ: 0.5000 mg | SUBCUTANEOUS | 0 refills | Status: DC

## 2023-11-02 NOTE — Telephone Encounter (Signed)
 Copied from CRM [id number]. Topic: Clinical - Prescription Issue >> Nov 02, 2023 11:29 AM Christopher Burns wrote: Reason for CRM: Patient called in regarding Semaglutide-Weight Management (WEGOVY) 0.25 MG/0.5ML SOAJ , wanting to know if he was going to up the dosage and get that sent in today as he will be going on 2 days without his shot.

## 2023-11-13 ENCOUNTER — Other Ambulatory Visit: Payer: Self-pay | Admitting: Nurse Practitioner

## 2023-11-13 DIAGNOSIS — I10 Essential (primary) hypertension: Secondary | ICD-10-CM

## 2023-11-13 NOTE — Telephone Encounter (Signed)
 Per Chart review Wegovy .5mg  was sent in on 11/02/23

## 2023-11-26 ENCOUNTER — Other Ambulatory Visit: Payer: Self-pay | Admitting: Nurse Practitioner

## 2023-11-27 NOTE — Telephone Encounter (Signed)
 I have sent in the increase dose of wegovy needs an appointment in 30 days to continue getting refills

## 2023-11-28 NOTE — Telephone Encounter (Signed)
 Spoke to pt, scheduled ov for 12/15/23

## 2023-12-15 ENCOUNTER — Ambulatory Visit: Admitting: Nurse Practitioner

## 2023-12-15 VITALS — BP 124/72 | HR 78 | Temp 98.1°F | Ht 71.0 in | Wt 244.0 lb

## 2023-12-15 DIAGNOSIS — F439 Reaction to severe stress, unspecified: Secondary | ICD-10-CM | POA: Diagnosis not present

## 2023-12-15 DIAGNOSIS — E669 Obesity, unspecified: Secondary | ICD-10-CM

## 2023-12-15 NOTE — Progress Notes (Signed)
 Established Patient Office Visit  Subjective   Patient ID: Christopher Burns, male    DOB: [birth year]  Age: 53 y.o. MRN: [id number]  Chief Complaint  Patient presents with   Follow-up    wegovy     HPI  Obesity: patient was started on wegovy  and is currently on 1mg  once a weekly. Last office visit he was 250 pounds. He was started on the medication there after. He is here for a follow up today. He is down 6 pounds currently  States that over the past couple of weeks he has not been doing well with snack choices.  2 meals a day States that he is not getting enough fluid a day. Less than a gallon. He is doing coffee every morning  Exercising 3-4 times a week.  This does help with his stress load  States that his father is in hospice and having trouble sleeping since the news and he is driving 1 hour each way when he is visiting his father. States that the CPAP is helping in regards to when he puts the mask on he is able to go to sleep.  Patient has tried melatonin without great relief.  States has been out of work for almost the past week but anticipates going back to work today and working the weekend.     Review of Systems  Constitutional:  Negative for chills and fever.  Respiratory:  Negative for shortness of breath.   Cardiovascular:  Negative for chest pain.  Gastrointestinal:  Positive for constipation. Negative for abdominal pain, diarrhea, nausea and vomiting.       BM daily to every other day  Neurological:  Negative for headaches.  Psychiatric/Behavioral:  Negative for hallucinations and suicidal ideas.       Objective:     BP 124/72   Pulse 78   Temp 98.1 F (36.7 C) (Oral)   Ht 5\' 11"  (1.803 m)   Wt 244 lb (110.7 kg)   SpO2 98%   BMI 34.03 kg/m  BP Readings from Last 3 Encounters:  12/15/23 124/72  10/09/23 128/76  10/02/23 130/80   Wt Readings from Last 3 Encounters:  12/15/23 244 lb (110.7 kg)  10/09/23 250 lb (113.4 kg)  10/02/23 250 lb (113.4 kg)    SpO2 Readings from Last 3 Encounters:  12/15/23 98%  10/09/23 96%  10/02/23 97%      Physical Exam Vitals and nursing note reviewed.  Constitutional:      Appearance: Normal appearance.  Cardiovascular:     Rate and Rhythm: Normal rate and regular rhythm.     Heart sounds: Normal heart sounds.  Pulmonary:     Effort: Pulmonary effort is normal.     Breath sounds: Normal breath sounds.  Abdominal:     General: Bowel sounds are normal. There is no distension.     Palpations: There is no mass.     Tenderness: There is no abdominal tenderness.     Hernia: No hernia is present.  Neurological:     Mental Status: He is alert.      No results found for any visits on 12/15/23.    The 10-year ASCVD risk score (Arnett DK, et al., 2019) is: 5.9%    Assessment & Plan:   Problem List Items Addressed This Visit       Other   Obesity (BMI 30-39.9) - Primary   Patient with a history of gastric surgery currently maintained on Wegovy  1 mg weekly.  Patient's bowels  are moving appropriately.  Encouraged adequate hydration of 64 ounces of water  a day.  Patient is exercising appropriately.  Patient had some slip in his diet as of late with the news of his father being placed in the hospice.  Continue working on healthy lifestyle modifications inclusive of diet continue exercising as is.      Stress   Increased stress as of late with his father being placed in the hospice and presumably end-of-life coming.  Patient is having some difficulty sleeping did offer patient hydroxyzine  or trazodone to help with sleep patient politely declined states that he will stick with melatonin currently.  Patient reach out to the office if he needs anything in the interim       Return in about 3 months (around 03/16/2024) for wegovy .    Margarie Shay, NP

## 2023-12-15 NOTE — Assessment & Plan Note (Signed)
 Increased stress as of late with his father being placed in the hospice and presumably end-of-life coming.  Patient is having some difficulty sleeping did offer patient hydroxyzine  or trazodone to help with sleep patient politely declined states that he will stick with melatonin currently.  Patient reach out to the office if he needs anything in the interim

## 2023-12-15 NOTE — Assessment & Plan Note (Signed)
 Patient with a history of gastric surgery currently maintained on Wegovy  1 mg weekly.  Patient's bowels are moving appropriately.  Encouraged adequate hydration of 64 ounces of water  a day.  Patient is exercising appropriately.  Patient had some slip in his diet as of late with the news of his father being placed in the hospice.  Continue working on healthy lifestyle modifications inclusive of diet continue exercising as is.

## 2023-12-24 ENCOUNTER — Other Ambulatory Visit: Payer: Self-pay | Admitting: Nurse Practitioner

## 2024-01-19 ENCOUNTER — Encounter (HOSPITAL_COMMUNITY): Payer: Self-pay | Admitting: *Deleted

## 2024-01-23 ENCOUNTER — Other Ambulatory Visit: Payer: Self-pay | Admitting: Nurse Practitioner

## 2024-02-15 ENCOUNTER — Other Ambulatory Visit: Payer: Self-pay | Admitting: Nurse Practitioner

## 2024-02-15 DIAGNOSIS — I10 Essential (primary) hypertension: Secondary | ICD-10-CM

## 2024-02-18 ENCOUNTER — Other Ambulatory Visit: Payer: Self-pay | Admitting: Nurse Practitioner

## 2024-02-22 ENCOUNTER — Telehealth: Payer: Self-pay

## 2024-02-22 ENCOUNTER — Other Ambulatory Visit (HOSPITAL_COMMUNITY): Payer: Self-pay

## 2024-02-22 NOTE — Telephone Encounter (Signed)
 Patient is calling wanting to know status of med states he gets his refills from [facility] in [place] not sure who [facility] is and states he is due for a injection tomorrow, and it has never taken this long to get a refill.

## 2024-02-22 NOTE — Telephone Encounter (Signed)
 Pharmacy Patient Advocate Encounter   Received notification from CoverMyMeds that prior authorization for Wegovy  2.4MG /0.75ML auto-injectors is required/requested.   Insurance verification completed.   The patient is insured through [facility] .   Per test claim: PA required; PA started via CoverMyMeds. KEY [id number] . Please see clinical question(s) below that I am not finding the answer to in their chart and advise.

## 2024-02-23 ENCOUNTER — Other Ambulatory Visit (HOSPITAL_COMMUNITY): Payer: Self-pay

## 2024-02-23 NOTE — Telephone Encounter (Signed)
 Called pt.  Pt's current weight is 235.

## 2024-02-23 NOTE — Telephone Encounter (Signed)
 Pharmacy Patient Advocate Encounter  Received notification from [facility] that Prior Authorization for Wegovy  2.4MG /0.75ML auto-injectors has been APPROVED from 02/23/24 to 02/22/25. Ran test claim, Copay is $24.99. This test claim was processed through [facility]- copay amounts may vary at other pharmacies due to pharmacy/plan contracts, or as the patient moves through the different stages of their insurance plan.   PA #/Case ID/Reference #: [id number]

## 2024-02-23 NOTE — Telephone Encounter (Signed)
 Called pt to let him know of approval for WeGovy  thru PA.  Pt verbalized understanding and has no questions or concerns.

## 2024-02-23 NOTE — Telephone Encounter (Signed)
 Patient has lost over 5% of his body weight

## 2024-02-23 NOTE — Telephone Encounter (Signed)
 Clinical questions have been answered and PA submitted. PA currently Pending.  Key: [id number]

## 2024-02-23 NOTE — Telephone Encounter (Signed)
 Can we call and see what the patients current weight is please

## 2024-03-29 ENCOUNTER — Ambulatory Visit: Admitting: Nurse Practitioner

## 2024-03-29 VITALS — BP 114/72 | HR 67 | Temp 98.1°F | Ht 71.0 in | Wt 239.6 lb

## 2024-03-29 DIAGNOSIS — E669 Obesity, unspecified: Secondary | ICD-10-CM | POA: Diagnosis not present

## 2024-03-29 DIAGNOSIS — L989 Disorder of the skin and subcutaneous tissue, unspecified: Secondary | ICD-10-CM | POA: Diagnosis not present

## 2024-03-29 NOTE — Patient Instructions (Signed)
 Nice to see you today I want to see you in 3 months, sooner If you need

## 2024-03-29 NOTE — Progress Notes (Signed)
 Established Patient Office Visit  Subjective   Patient ID: Christopher Burns, male    DOB: October 12, 1970  Age: 53 y.o. MRN: [id number]  Chief Complaint  Patient presents with   Follow-up    Wegovy . Pt is doing okay on medication. But complains of stomach gurgling every morning, no other concerns.     HPI   Discussed the use of AI scribe software for clinical note transcription with the patient, who gave verbal consent to proceed.  History of Present Illness Christopher Burns is a 53 year old male who presents for follow-up on Wegovy  treatment.  He weighs 235 pounds at home and 239 pounds in the clinic, noting some fluctuation. He tolerates Wegovy  well but experiences gurgling in the morning. He has a change in bowel habits, going three days without a bowel movement, followed by a cathartic experience with three bowel movements in one day. His bowel movements depend on his diet, particularly fiber intake. He drinks about a gallon of water  daily, sometimes less, and does not count caffeinated coffee as part of his fluid intake. No fever, chills, chest pain, shortness of breath, nausea, vomiting, or bowel pain.  He exercises every two to three days, engaging in calisthenics, walking an average of three miles a day, and playing softball. He plans to start playing senior baseball. He eats two major meals a day and occasionally snacks on protein bars or fiber-rich crackers. He adds protein powder to his morning coffee and uses lower-fat dairy-free milk.  He mentions a previous foot surgery last year to remove a bone piece, performed by Dr. Magdalen. The area healed well, but he notices a crack that occasionally bleeds. He uses medicated foot cream nightly to manage calluses and dry skin.     Review of Systems  Constitutional:  Negative for chills and fever.  Respiratory:  Negative for shortness of breath.   Cardiovascular:  Negative for chest pain.  Gastrointestinal:  Positive for  constipation. Negative for abdominal pain, nausea and vomiting.      Objective:     BP 114/72   Pulse 67   Temp 98.1 F (36.7 C) (Oral)   Ht 5' 11 (1.803 m)   Wt 239 lb 9.6 oz (108.7 kg)   SpO2 99%   BMI 33.42 kg/m  BP Readings from Last 3 Encounters:  03/29/24 114/72  12/15/23 124/72  10/09/23 128/76   Wt Readings from Last 3 Encounters:  03/29/24 239 lb 9.6 oz (108.7 kg)  12/15/23 244 lb (110.7 kg)  10/09/23 250 lb (113.4 kg)   SpO2 Readings from Last 3 Encounters:  03/29/24 99%  12/15/23 98%  10/09/23 96%      Physical Exam Vitals and nursing note reviewed.  Constitutional:      Appearance: Normal appearance.  Cardiovascular:     Rate and Rhythm: Normal rate and regular rhythm.     Heart sounds: Normal heart sounds.  Pulmonary:     Effort: Pulmonary effort is normal.     Breath sounds: Normal breath sounds.  Abdominal:     General: Bowel sounds are normal.  Neurological:     Mental Status: He is alert.      No results found for any visits on 03/29/24.    The 10-year ASCVD risk score (Arnett DK, et al., 2019) is: 5.1%    Assessment & Plan:   Problem List Items Addressed This Visit   None Assessment and Plan Assessment & Plan Obesity treated with GLP-1 agonist therapy Obesity  managed with Wegovy  2.4 mg weekly. Weight decreased from 244 lbs to 235 lbs at home and 239 lbs in office. Tolerates medication with minor gastrointestinal side effects. Engages in regular exercise and maintains a protein-focused diet. - Continue Wegovy  2.4 mg weekly. - Encourage regular exercise and balanced diet with adequate protein. - Follow up in 3 months for reassessment.  Constipation secondary to GLP-1 agonist therapy Constipation due to GLP-1 agonist therapy. Bowel movements every three days with occasional cathartic episodes. Hydration adequate with a gallon of water  daily. - Encourage increased fiber intake. - Maintain hydration with at least 64 ounces of  water  daily.  Dry, cracked skin at site of prior foot surgery Dry, cracked skin at prior foot surgery site, occasionally bleeding but not painful. Uses medicated foot cream nightly. - Apply Vaseline at night and cover with a sock. - Continue using medicated foot cream nightly after showering.   Return in about 3 months (around 06/29/2024) for weight/ wegovy  .    Adina Crandall, NP

## 2024-05-06 ENCOUNTER — Encounter: Payer: Self-pay | Admitting: Nurse Practitioner

## 2024-05-12 ENCOUNTER — Other Ambulatory Visit: Payer: Self-pay | Admitting: Nurse Practitioner

## 2024-07-08 ENCOUNTER — Ambulatory Visit: Admitting: Nurse Practitioner

## 2024-07-28 ENCOUNTER — Other Ambulatory Visit: Payer: Self-pay | Admitting: Nurse Practitioner

## 2024-08-18 ENCOUNTER — Other Ambulatory Visit: Payer: Self-pay | Admitting: Nurse Practitioner

## 2024-08-22 ENCOUNTER — Ambulatory Visit: Admitting: Nurse Practitioner

## 2024-08-22 VITALS — BP 100/72 | HR 67 | Temp 97.7°F | Ht 71.0 in | Wt 232.0 lb

## 2024-08-22 DIAGNOSIS — K649 Unspecified hemorrhoids: Secondary | ICD-10-CM

## 2024-08-22 MED ORDER — HYDROCORTISONE ACETATE 25 MG RE SUPP: 25.0000 mg | Freq: Two times a day (BID) | RECTAL | 0 refills | Status: AC

## 2024-08-22 NOTE — Progress Notes (Signed)
 "  Established Patient Office Visit  Subjective   Patient ID: Christopher Burns, male    DOB: 1971/05/02  Age: 54 y.o. MRN: [id number]  Chief Complaint  Patient presents with   Follow-up    Weight/wegovy . Pt complains of wegovy  doing well but has a side effect of constipation. Pt states that he could go 4 days without a bowel movement.     HPI  Discussed the use of AI scribe software for clinical note transcription with the patient, who gave verbal consent to proceed.  Patients starting weight was 250   History of Present Illness Christopher Burns is a 54 year old male who presents for follow-up on Wegovy  treatment.  He has been experiencing constipation since starting Wegovy , with bowel movements occurring every three to four days. He sometimes has difficulty passing stool, which can trigger hemorrhoids and result in bleeding. His low water  intake may contribute to these symptoms.  He uses probiotics such as acidophilus and drinks a glass of kefir daily, flavored with sugar-free syrup. His diet could include more greens and fiber, and he sometimes eats only once or twice a day. He has a preference for sweets, particularly chocolate.  He has been losing weight, with his weight decreasing from 244 to 232 pounds. His physical activity is sporadic, with some weeks including daily exercise and others without any. He averages about 4,000 steps per day, though he aims for 7,500. His work schedule impacts his ability to exercise regularly.  He experiences significant gas in the mornings, which he attributes to his high protein intake. He uses Tux pads occasionally for hemorrhoid relief and has previously used prescription suppositories, though they are now expired.     Review of Systems  Constitutional:  Negative for chills and fever.  Respiratory:  Negative for shortness of breath.   Cardiovascular:  Negative for chest pain.  Gastrointestinal:  Positive for constipation. Negative for  abdominal pain, nausea and vomiting.      Objective:     BP 100/72   Pulse 67   Temp 97.7 F (36.5 C) (Oral)   Ht 5' 11 (1.803 m)   Wt 232 lb (105.2 kg)   SpO2 99%   BMI 32.36 kg/m  BP Readings from Last 3 Encounters:  08/22/24 100/72  03/29/24 114/72  12/15/23 124/72   Wt Readings from Last 3 Encounters:  08/22/24 232 lb (105.2 kg)  03/29/24 239 lb 9.6 oz (108.7 kg)  12/15/23 244 lb (110.7 kg)   SpO2 Readings from Last 3 Encounters:  08/22/24 99%  03/29/24 99%  12/15/23 98%    Physical Exam Vitals and nursing note reviewed.  Constitutional:      Appearance: Normal appearance.  Cardiovascular:     Rate and Rhythm: Normal rate and regular rhythm.     Heart sounds: Normal heart sounds.  Pulmonary:     Effort: Pulmonary effort is normal.     Breath sounds: Normal breath sounds.  Abdominal:     General: Bowel sounds are normal. There is no distension.     Tenderness: There is no abdominal tenderness.  Neurological:     Mental Status: He is alert.      No results found for any visits on 08/22/24.    The 10-year ASCVD risk score (Arnett DK, et al., 2019) is: 4.4%    Assessment & Plan:   Problem List Items Addressed This Visit   None  Assessment and Plan Assessment & Plan Obesity Weight decreased from 244 lbs  to 232 lbs. Exercise sporadic. Increased protein intake may cause gastrointestinal symptoms. - Continue Wegovy  2.4 mg subcutaneous weekly. - Encouraged increased water  intake to 64 ounces daily. - Advised increasing physical activity to 7,500 steps per day. - Discussed dietary modifications to include more fiber and greens.  Constipation secondary to GLP-1 agonist therapy Constipation persists with bowel movements every 3-4 days. Inadequate water  intake may contribute. - Increase water  intake to 64 ounces daily. - Use over-the-counter stool softeners as needed. - Consider daily Miralax if constipation persists, ensuring adequate fluid  intake.  Hemorrhoids Occasional flare-ups with bright red blood on toilet tissue. Uses witch hazel pads for relief. - Use Anusol  suppositories if hemorrhoids flare up, with a 5-day trial period. - Continue using witch hazel pads for soothing relief. - Monitor for improvement; if no improvement after 5 days, reassess for other causes.  Return in about 3 months (around 11/20/2024) for CPE and Labs.    Adina Crandall, NP  "

## 2024-08-22 NOTE — Patient Instructions (Signed)
 Nice to see you today  Try and increase your water  intake to 64oz a day You can use miralax daily to help regulate your bowel movements Follow up with me in 3 months for your physical and labs, sooner if you need me

## 2024-08-30 ENCOUNTER — Telehealth: Payer: Self-pay

## 2024-08-30 NOTE — Telephone Encounter (Signed)
 Copied from CRM [id number]. Topic: General - Other >> Aug 30, 2024  2:57 PM Mercedes MATSU wrote: Reason for CRM: [employer] called in wanting to know all of the patients last visits, is requesting call bak at [phone]. Will also be faxing over some paper work for the patient.

## 2024-09-03 NOTE — Telephone Encounter (Signed)
 Have you received any ppw on this?

## 2024-09-05 NOTE — Telephone Encounter (Signed)
 Called number provided they didn't fax paperwork. Have mailed to patient to have them fill out authorization. Will fax once received.  No further action needed at this time.

## 2024-09-05 NOTE — Telephone Encounter (Signed)
 I just reviewed all my paperwork and did not have any forms for this patient

## 2024-11-21 ENCOUNTER — Encounter: Admitting: Nurse Practitioner
